# Patient Record
Sex: Female | Born: 1950 | Race: Black or African American | Hispanic: No | Marital: Married | Smoking: Never smoker
Health system: Southern US, Community
[De-identification: ages and names within clinical notes are randomized; demographics above are authoritative.]

## PROBLEM LIST (undated history)

## (undated) DIAGNOSIS — M8580 Other specified disorders of bone density and structure, unspecified site: Secondary | ICD-10-CM

## (undated) DIAGNOSIS — R112 Nausea with vomiting, unspecified: Secondary | ICD-10-CM

## (undated) DIAGNOSIS — E7800 Pure hypercholesterolemia, unspecified: Secondary | ICD-10-CM

## (undated) DIAGNOSIS — M797 Fibromyalgia: Secondary | ICD-10-CM

## (undated) DIAGNOSIS — E119 Type 2 diabetes mellitus without complications: Secondary | ICD-10-CM

## (undated) DIAGNOSIS — K851 Biliary acute pancreatitis without necrosis or infection: Secondary | ICD-10-CM

## (undated) DIAGNOSIS — I639 Cerebral infarction, unspecified: Secondary | ICD-10-CM

## (undated) DIAGNOSIS — K529 Noninfective gastroenteritis and colitis, unspecified: Secondary | ICD-10-CM

## (undated) DIAGNOSIS — I10 Essential (primary) hypertension: Secondary | ICD-10-CM

## (undated) DIAGNOSIS — M1990 Unspecified osteoarthritis, unspecified site: Secondary | ICD-10-CM

## (undated) DIAGNOSIS — Z9889 Other specified postprocedural states: Secondary | ICD-10-CM

## (undated) DIAGNOSIS — K219 Gastro-esophageal reflux disease without esophagitis: Secondary | ICD-10-CM

## (undated) DIAGNOSIS — J309 Allergic rhinitis, unspecified: Secondary | ICD-10-CM

## (undated) DIAGNOSIS — G5600 Carpal tunnel syndrome, unspecified upper limb: Secondary | ICD-10-CM

## (undated) HISTORY — PX: TUBAL LIGATION: SHX77

## (undated) HISTORY — PX: COLPOSCOPY: SHX161

## (undated) HISTORY — DX: Unspecified osteoarthritis, unspecified site: M19.90

## (undated) HISTORY — PX: PELVIC LAPAROSCOPY: SHX162

## (undated) HISTORY — PX: HAND SURGERY: SHX662

## (undated) HISTORY — DX: Pure hypercholesterolemia, unspecified: E78.00

## (undated) HISTORY — DX: Noninfective gastroenteritis and colitis, unspecified: K52.9

## (undated) HISTORY — PX: KNEE ARTHROSCOPY: SUR90

## (undated) HISTORY — DX: Cerebral infarction, unspecified: I63.9

## (undated) HISTORY — DX: Other specified disorders of bone density and structure, unspecified site: M85.80

## (undated) HISTORY — DX: Allergic rhinitis, unspecified: J30.9

## (undated) HISTORY — PX: APPENDECTOMY: SHX54

## (undated) HISTORY — PX: NECK SURGERY: SHX720

## (undated) HISTORY — PX: CATARACT EXTRACTION, BILATERAL: SHX1313

## (undated) HISTORY — DX: Biliary acute pancreatitis without necrosis or infection: K85.10

## (undated) HISTORY — DX: Fibromyalgia: M79.7

## (undated) HISTORY — DX: Carpal tunnel syndrome, unspecified upper limb: G56.00

---

## 1992-12-19 HISTORY — PX: CERVICAL BIOPSY  W/ LOOP ELECTRODE EXCISION: SUR135

## 1996-11-18 HISTORY — PX: OTHER SURGICAL HISTORY: SHX169

## 1999-09-16 ENCOUNTER — Other Ambulatory Visit: Admission: RE | Admit: 1999-09-16 | Discharge: 1999-09-16 | Payer: Self-pay | Admitting: Gynecology

## 2000-08-02 ENCOUNTER — Other Ambulatory Visit: Admission: RE | Admit: 2000-08-02 | Discharge: 2000-08-02 | Payer: Self-pay | Admitting: Gynecology

## 2000-10-31 ENCOUNTER — Encounter (INDEPENDENT_AMBULATORY_CARE_PROVIDER_SITE_OTHER): Payer: Self-pay | Admitting: Specialist

## 2000-10-31 ENCOUNTER — Encounter: Payer: Self-pay | Admitting: Surgery

## 2000-10-31 ENCOUNTER — Observation Stay (HOSPITAL_COMMUNITY): Admission: EM | Admit: 2000-10-31 | Discharge: 2000-11-01 | Payer: Self-pay | Admitting: Emergency Medicine

## 2001-09-03 ENCOUNTER — Other Ambulatory Visit: Admission: RE | Admit: 2001-09-03 | Discharge: 2001-09-03 | Payer: Self-pay | Admitting: Gynecology

## 2002-06-11 ENCOUNTER — Ambulatory Visit (HOSPITAL_COMMUNITY): Admission: RE | Admit: 2002-06-11 | Discharge: 2002-06-11 | Payer: Self-pay | Admitting: *Deleted

## 2002-09-09 ENCOUNTER — Other Ambulatory Visit: Admission: RE | Admit: 2002-09-09 | Discharge: 2002-09-09 | Payer: Self-pay | Admitting: Gynecology

## 2003-01-31 ENCOUNTER — Encounter: Admission: RE | Admit: 2003-01-31 | Discharge: 2003-01-31 | Payer: Self-pay | Admitting: General Surgery

## 2003-01-31 ENCOUNTER — Encounter: Payer: Self-pay | Admitting: General Surgery

## 2003-10-20 ENCOUNTER — Other Ambulatory Visit: Admission: RE | Admit: 2003-10-20 | Discharge: 2003-10-20 | Payer: Self-pay | Admitting: Gynecology

## 2003-11-03 ENCOUNTER — Encounter: Admission: RE | Admit: 2003-11-03 | Discharge: 2003-11-03 | Payer: Self-pay | Admitting: General Surgery

## 2004-11-09 ENCOUNTER — Other Ambulatory Visit: Admission: RE | Admit: 2004-11-09 | Discharge: 2004-11-09 | Payer: Self-pay | Admitting: Gynecology

## 2005-02-02 ENCOUNTER — Encounter: Admission: RE | Admit: 2005-02-02 | Discharge: 2005-02-02 | Payer: Self-pay | Admitting: General Surgery

## 2005-06-17 ENCOUNTER — Encounter: Admission: RE | Admit: 2005-06-17 | Discharge: 2005-06-17 | Payer: Self-pay | Admitting: General Surgery

## 2005-06-17 ENCOUNTER — Encounter (INDEPENDENT_AMBULATORY_CARE_PROVIDER_SITE_OTHER): Payer: Self-pay | Admitting: Specialist

## 2005-06-17 ENCOUNTER — Other Ambulatory Visit: Admission: RE | Admit: 2005-06-17 | Discharge: 2005-06-17 | Payer: Self-pay | Admitting: Diagnostic Radiology

## 2005-07-04 ENCOUNTER — Encounter: Admission: RE | Admit: 2005-07-04 | Discharge: 2005-07-04 | Payer: Self-pay | Admitting: General Surgery

## 2005-07-22 ENCOUNTER — Encounter: Admission: RE | Admit: 2005-07-22 | Discharge: 2005-07-22 | Payer: Self-pay | Admitting: General Surgery

## 2005-12-15 ENCOUNTER — Other Ambulatory Visit: Admission: RE | Admit: 2005-12-15 | Discharge: 2005-12-15 | Payer: Self-pay | Admitting: Gynecology

## 2006-06-03 ENCOUNTER — Emergency Department (HOSPITAL_COMMUNITY): Admission: EM | Admit: 2006-06-03 | Discharge: 2006-06-03 | Payer: Self-pay | Admitting: Emergency Medicine

## 2006-12-18 ENCOUNTER — Other Ambulatory Visit: Admission: RE | Admit: 2006-12-18 | Discharge: 2006-12-18 | Payer: Self-pay | Admitting: Gynecology

## 2007-12-10 ENCOUNTER — Emergency Department (HOSPITAL_COMMUNITY): Admission: EM | Admit: 2007-12-10 | Discharge: 2007-12-10 | Payer: Self-pay | Admitting: Emergency Medicine

## 2007-12-21 ENCOUNTER — Other Ambulatory Visit: Admission: RE | Admit: 2007-12-21 | Discharge: 2007-12-21 | Payer: Self-pay | Admitting: Gynecology

## 2008-04-18 ENCOUNTER — Emergency Department (HOSPITAL_COMMUNITY): Admission: EM | Admit: 2008-04-18 | Discharge: 2008-04-18 | Payer: Self-pay | Admitting: Family Medicine

## 2008-11-06 ENCOUNTER — Ambulatory Visit (HOSPITAL_COMMUNITY): Admission: RE | Admit: 2008-11-06 | Discharge: 2008-11-07 | Payer: Self-pay | Admitting: Orthopedic Surgery

## 2008-12-29 ENCOUNTER — Encounter: Payer: Self-pay | Admitting: Gynecology

## 2008-12-29 ENCOUNTER — Ambulatory Visit: Payer: Self-pay | Admitting: Gynecology

## 2008-12-29 ENCOUNTER — Other Ambulatory Visit: Admission: RE | Admit: 2008-12-29 | Discharge: 2008-12-29 | Payer: Self-pay | Admitting: Gynecology

## 2009-09-07 ENCOUNTER — Encounter: Admission: RE | Admit: 2009-09-07 | Discharge: 2009-09-07 | Payer: Self-pay | Admitting: Orthopedic Surgery

## 2009-11-02 ENCOUNTER — Encounter: Admission: RE | Admit: 2009-11-02 | Discharge: 2009-11-02 | Payer: Self-pay | Admitting: Orthopedic Surgery

## 2009-11-11 ENCOUNTER — Telehealth (INDEPENDENT_AMBULATORY_CARE_PROVIDER_SITE_OTHER): Payer: Self-pay | Admitting: *Deleted

## 2010-01-04 ENCOUNTER — Ambulatory Visit: Payer: Self-pay | Admitting: Gynecology

## 2010-01-04 ENCOUNTER — Other Ambulatory Visit: Admission: RE | Admit: 2010-01-04 | Discharge: 2010-01-04 | Payer: Self-pay | Admitting: Gynecology

## 2010-03-18 ENCOUNTER — Ambulatory Visit: Payer: Self-pay | Admitting: Gynecology

## 2010-08-30 ENCOUNTER — Encounter: Admission: RE | Admit: 2010-08-30 | Discharge: 2010-08-30 | Payer: Self-pay | Admitting: Cardiology

## 2011-01-09 ENCOUNTER — Encounter: Payer: Self-pay | Admitting: Gynecology

## 2011-01-09 ENCOUNTER — Encounter: Payer: Self-pay | Admitting: Family Medicine

## 2011-02-18 ENCOUNTER — Encounter: Payer: Self-pay | Admitting: Gynecology

## 2011-03-21 ENCOUNTER — Encounter (INDEPENDENT_AMBULATORY_CARE_PROVIDER_SITE_OTHER): Payer: BC Managed Care – PPO | Admitting: Gynecology

## 2011-03-21 ENCOUNTER — Other Ambulatory Visit: Payer: Self-pay | Admitting: Gynecology

## 2011-03-21 ENCOUNTER — Other Ambulatory Visit (HOSPITAL_COMMUNITY)
Admission: RE | Admit: 2011-03-21 | Discharge: 2011-03-21 | Disposition: A | Discharge status: Home / Self Care | Payer: BC Managed Care – PPO | Source: Ambulatory Visit | Attending: Gynecology | Admitting: Gynecology

## 2011-03-21 DIAGNOSIS — Z01419 Encounter for gynecological examination (general) (routine) without abnormal findings: Secondary | ICD-10-CM

## 2011-03-21 DIAGNOSIS — Z124 Encounter for screening for malignant neoplasm of cervix: Secondary | ICD-10-CM | POA: Insufficient documentation

## 2011-03-21 DIAGNOSIS — Z13220 Encounter for screening for lipoid disorders: Secondary | ICD-10-CM

## 2011-03-21 DIAGNOSIS — R82998 Other abnormal findings in urine: Secondary | ICD-10-CM

## 2011-03-21 DIAGNOSIS — Z833 Family history of diabetes mellitus: Secondary | ICD-10-CM

## 2011-03-24 ENCOUNTER — Other Ambulatory Visit (INDEPENDENT_AMBULATORY_CARE_PROVIDER_SITE_OTHER): Payer: BC Managed Care – PPO

## 2011-03-24 DIAGNOSIS — E7800 Pure hypercholesterolemia, unspecified: Secondary | ICD-10-CM

## 2011-04-25 ENCOUNTER — Other Ambulatory Visit: Payer: BC Managed Care – PPO

## 2011-05-03 NOTE — Op Note (Signed)
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   OPERATIVE PROCEDURE:  Anterior cervical diskectomy and fusion at C5-C6.   COMPLICATIONS:  None.   CONDITION:  Stable.   HISTORY:  This is a very pleasant 60 year old woman who has been having  longstanding severe debilitating neck and right arm pain.  The patient's  clinical history was consistent with a C6 radiculopathy due to the C5-C6  right-sided hard disk osteophyte.  After discussing treatment options  and having failed conservative management, she elected to proceed with  surgery.   All appropriate risks, benefits, and alternatives were discussed and  consent was obtained.   OPERATIVE NOTE:  The patient was brought to the operating room and  placed supine on the operating table.  After successful induction of  general anesthesia and endotracheal intubation, TEDs and SCDs were  applied.  She was placed supine on the Skytron bed.  The roll towels  were placed between the shoulder blades.  The shoulders were taped and  the anterior cervical spine was prepped and draped in the usual standard  fashion.   The inferior aspect of the thyroid cartilage was palpated and a  horizontal incision was made on the left-hand side.  Sharp dissection  was carried out down to and through the platysma.  I then sharply  dissected through  the deep cervical fascia keeping the carotid sheath  lateral and the trachea and esophagus medial.  I then swept with a  finger dissecting bluntly through the deep cervical and prevertebral  fascia to palpate the anterior cervical spine.  I then swept the trachea  and esophagus medially with my finger, palpated and protected the  carotid sheath laterally with another finger.  I then placed an  appendiceal retractor to retract the trachea and esophagus and exposed  the anterior cervical spine.   An 18-gauge needle was then placed through the C5 disk space.  Intraoperative lateral fluoroscopy confirmed that I was at the  appropriate level.  Once this was confirmed, I then mobilized the longus  coli muscles bilaterally out to the level of the uncovertebral joints.  At this point, I had clear visualization of C5-C6 disk space.  I placed  self-retaining retractors into the wounds (shadow line retractors),  deflated the endotracheal cuff and expanded into the appropriate  position.  Care was taken to ensure that the blades were beneath the  longus coli muscles.   Once the endotracheal cuff was inflated to remove any pressure points.  I placed the distraction pins into the bodies of C5-C6 and gently  distracted the C5-C6 disk space.   A 15 blade scalpel was used to incise the annulus and then using a  combination of pituitary rongeurs, curettes, and Kerrison rongeurs, I  resected the disk.  I then removed the cartilage from the subchondral  plate to expose the bleeding subchondral bone.  I then debrided it down  removing the posterior osteophytic ridge.  Using a fine curette, I  developed a plane between the posterior longitudinal ligament and the  dura.  Using a 1-mm Kerrison, I resected the entire posterior  longitudinal ligament on the right side.  I also resected some of the  bone spurs and I removed the soft disk fragments from behind the  posterior longitudinal ligament.  At this  point, I had an adequate  decompression of the disk space and right foramen.  I then rasped the  endplates, measured the interbody space, and then took an 8-mm precut  lordotic spacer.  I hydrated it and then tacked the center with  Actifuse.  I then malleted it to the appropriate resting position.  I  then took a small 14-mm cervical plate Synthes Vector and contoured it  and then secured it to the bodies of C5 and C6 with 40-mm screws.  Because of the trapezial shape of C5, I tried to move the graft as close  as I could to the C5 inferior endplate, but it was difficult.  I did  remove the plate and checked to see if I could reposition it and I felt  as though the initial position was the best position I had and then I  used a 40-mm rescue screws to lock it back into place.  This plate was  secured.  I took final AP and lateral x-rays which were satisfactory.  I  removed the remaining retractors, checked to ensure that the esophagus  was not entrapped underneath the plate and returned to midline.  I  irrigated it copiously with normal saline, closed platysma with  interrupted 2-0 Vicryl sutures and 3-0 Monocryl for the skin.  Steri-  Strips and dry dressing were applied.  The patient was extubated and  transferred to PACU without incident.  At the end of the case, all  needle and sponge counts were correct.  Dr. Yolanda Bonine dictation cc, my  office      Alvy Beal, MD  Electronically Signed     DDB/MEDQ  D:  11/06/2008  T:  11/06/2008  Job:  [id number]

## 2011-05-06 NOTE — Op Note (Signed)
[facility]  Patient:    Rhonda Norris, Rhonda Norris                     MRN: [id number] Proc. Date: 10/31/00 Attending:  Thornton Park. Daphine Deutscher, M.D. CC:         Gabriel Earing, M.D.   Operative Report  PREOPERATIVE DIAGNOSIS:  Acute appendicitis.  POSTOPERATIVE DIAGNOSIS:  Acute appendicitis.  PROCEDURE:  Laparoscopic appendectomy.  SURGEON:  Thornton Park. Daphine Deutscher, M.D.  ANESTHESIA:  General endotracheal.  DESCRIPTION OF PROCEDURE:  Rhonda Norris was seen in the emergency room and evaluated by me.  A CT scan was obtained and this showed acute appendicitis. I reviewed this and then talked to her about a laparoscopic or open appendectomy.  She was given general anesthesia and a gram of Cefotan preoperatively.  The abdomen was prepped with Betadine and draped sterile.  I made a longitudinal incision and went into her umbilicus.  Through that small hole, I put a pursestring suture through good fascia and inserted a Hasson cannula.  The abdomen was insufflated.  I could see the turgid, erect appendix standing up in the right lower quadrant.  It was enlarged and it was consistent with the appendicitis seen on the CT scan containing an append fecolith.  A 5 mm was placed in the right upper quadrant and a 12 mm was placed in the left lower quadrant, placed very obliquely.  There was no bleeding seen from either of the trocar sites after insertion.  The appendix was then mobilized more and I went through the mesentery first with the endo GIA vascular cartilage and then across the base.  This was then placed in a bag and brought out through the umbilicus.  A couple of clips were placed on the staple line where there was a little bit of oozing and the area was irrigated and no active bleeding was seen.  The patient has previously had a hysterectomy and an oophorectomy.  I did look in the pelvis and withdrew the irrigant.  The bowel was otherwise unremarkable.  The  liver look unremarkable and the gallbladder looked normal.  The port sites were examined.  The umbilical port was examined as I tied down the pursestring suture.  The abdomen was then deflated and the trocars were withdrawn.  The wounds were closed with 4-0 Vicryl and were injected with 0.5% Marcaine.  Benzoin and Steri-Strips were applied to the skin.  The patient tolerated the procedure well and was taken to the recovery room in satisfactory condition. DD:  10/31/00 TD:  10/31/00 Job: [id number]

## 2011-07-29 ENCOUNTER — Encounter (INDEPENDENT_AMBULATORY_CARE_PROVIDER_SITE_OTHER): Payer: Self-pay | Admitting: Surgery

## 2011-08-01 ENCOUNTER — Ambulatory Visit (INDEPENDENT_AMBULATORY_CARE_PROVIDER_SITE_OTHER): Payer: BC Managed Care – PPO | Admitting: Surgery

## 2011-08-01 ENCOUNTER — Encounter (INDEPENDENT_AMBULATORY_CARE_PROVIDER_SITE_OTHER): Payer: Self-pay | Admitting: Surgery

## 2011-08-01 ENCOUNTER — Encounter (INDEPENDENT_AMBULATORY_CARE_PROVIDER_SITE_OTHER): Payer: BC Managed Care – PPO | Admitting: Surgery

## 2011-08-01 ENCOUNTER — Other Ambulatory Visit (INDEPENDENT_AMBULATORY_CARE_PROVIDER_SITE_OTHER): Payer: Self-pay | Admitting: Surgery

## 2011-08-01 VITALS — BP 140/82 | HR 88 | Temp 97.0°F | Ht 64.0 in | Wt 163.2 lb

## 2011-08-01 DIAGNOSIS — N63 Unspecified lump in unspecified breast: Secondary | ICD-10-CM

## 2011-08-01 NOTE — Progress Notes (Signed)
Rhonda Norris is a 60 y.o. female.    Chief Complaint  Patient presents with  . Other    est pt- new prob swelling on lft br    HPI HPI This is a former patient of Dr. Francina Ames that he treated for some left breast swelling and a left breast cyst. She recently underwent a routine screening mammogram in March that was negative. However over the last couple of weeks she has developed some left breast swelling. She denies any palpable masses, breast tenderness, or skin changes. Given her history with Dr. Maple Hudson, she is mildly concerned and comes in today for evaluation.  Past Medical History  Diagnosis Date  . IBD (inflammatory bowel disease)   . Breast swelling     left  . Arthritis   . Carpal tunnel syndrome     Past Surgical History  Procedure Date  . Abdominal hysterectomy   . Appendectomy   . Hand surgery     bilateral  . Knee arthroscopy     rt  . Neck surgery     Family History  Problem Relation Age of Onset  . Heart disease Mother   . Heart disease Brother     Social History History  Substance Use Topics  . Smoking status: Never Smoker   . Smokeless tobacco: Not on file  . Alcohol Use: No    Allergies  Allergen Reactions  . Sulfur     Current Outpatient Prescriptions  Medication Sig Dispense Refill  . Cholecalciferol (VITAMIN D PO) Take by mouth daily.        . Multiple Vitamin (MULTIVITAMIN) capsule Take 1 capsule by mouth daily.        . nortriptyline (PAMELOR) 10 MG capsule Take 10 mg by mouth at bedtime.          Review of Systems ROS Menarche - age 3 First pregnancy - age 40 Breastfeeding - no Menopause - age 67 with hysterectomy Hormones - for several years Physical Exam Physical Exam   Blood pressure 140/82, pulse 88, temperature 97 F (36.1 C), temperature source Temporal, height 5\' 4"  (1.626 m), weight 163 lb 3.2 oz (74.027 kg). WDWN in NAD Breasts - slightly enlarged left breast; no palpable masses in either breast; no  axillary lymphadenopathy; slight fullness in left inner lower quadrant  Assessment/Plan Left breast asymmetry with slight breast swelling - history of the same problem six years ago  Plan:  Left breast ultrasound to evaluate for possible enlarged cyst - possible aspiration.  Burk Hoctor K. 08/01/2011, 11:57 AM

## 2011-08-03 ENCOUNTER — Encounter (INDEPENDENT_AMBULATORY_CARE_PROVIDER_SITE_OTHER): Payer: Self-pay | Admitting: Gynecology

## 2011-08-04 ENCOUNTER — Ambulatory Visit
Admission: RE | Admit: 2011-08-04 | Discharge: 2011-08-04 | Disposition: A | Discharge status: Home / Self Care | Payer: BC Managed Care – PPO | Source: Ambulatory Visit | Attending: Surgery | Admitting: Surgery

## 2011-08-04 DIAGNOSIS — N63 Unspecified lump in unspecified breast: Secondary | ICD-10-CM

## 2011-08-25 ENCOUNTER — Encounter (INDEPENDENT_AMBULATORY_CARE_PROVIDER_SITE_OTHER): Payer: BC Managed Care – PPO | Admitting: Surgery

## 2011-09-20 LAB — BASIC METABOLIC PANEL
BUN: 12
Creatinine, Ser: 0.69
GFR calc Af Amer: >60
Glucose, Bld: 106 — ABNORMAL HIGH

## 2011-09-20 LAB — CBC
HCT: 37.2
MCHC: 33.3
MCV: 89.2
Platelets: 383
RBC: 4.18

## 2012-07-30 ENCOUNTER — Other Ambulatory Visit: Payer: Self-pay | Admitting: Orthopedic Surgery

## 2012-07-30 DIAGNOSIS — S8991XA Unspecified injury of right lower leg, initial encounter: Secondary | ICD-10-CM

## 2012-07-30 DIAGNOSIS — M1990 Unspecified osteoarthritis, unspecified site: Secondary | ICD-10-CM

## 2012-08-06 ENCOUNTER — Ambulatory Visit
Admission: RE | Admit: 2012-08-06 | Discharge: 2012-08-06 | Disposition: A | Discharge status: Home / Self Care | Payer: Worker's Compensation | Source: Ambulatory Visit | Attending: Orthopedic Surgery | Admitting: Orthopedic Surgery

## 2012-08-06 DIAGNOSIS — S8991XA Unspecified injury of right lower leg, initial encounter: Secondary | ICD-10-CM

## 2012-08-06 DIAGNOSIS — M1990 Unspecified osteoarthritis, unspecified site: Secondary | ICD-10-CM

## 2012-08-06 MED ORDER — IOHEXOL 180 MG/ML  SOLN
20.0000 mL | Freq: Once | INTRAMUSCULAR | Status: AC | PRN
  Administered 2012-08-06: 20 mL via INTRA_ARTICULAR

## 2012-09-04 ENCOUNTER — Other Ambulatory Visit: Payer: Self-pay | Admitting: Gynecology

## 2012-09-04 DIAGNOSIS — Z1231 Encounter for screening mammogram for malignant neoplasm of breast: Secondary | ICD-10-CM

## 2012-09-12 ENCOUNTER — Encounter: Payer: Self-pay | Admitting: Gynecology

## 2012-09-12 ENCOUNTER — Ambulatory Visit (INDEPENDENT_AMBULATORY_CARE_PROVIDER_SITE_OTHER): Payer: Self-pay | Admitting: Gynecology

## 2012-09-12 ENCOUNTER — Ambulatory Visit
Admission: RE | Admit: 2012-09-12 | Discharge: 2012-09-12 | Disposition: A | Discharge status: Home / Self Care | Payer: BC Managed Care – PPO | Source: Ambulatory Visit | Attending: Gynecology | Admitting: Gynecology

## 2012-09-12 VITALS — BP 124/72 | Ht 64.0 in | Wt 167.0 lb

## 2012-09-12 DIAGNOSIS — M949 Disorder of cartilage, unspecified: Secondary | ICD-10-CM

## 2012-09-12 DIAGNOSIS — M899 Disorder of bone, unspecified: Secondary | ICD-10-CM

## 2012-09-12 DIAGNOSIS — Z1231 Encounter for screening mammogram for malignant neoplasm of breast: Secondary | ICD-10-CM

## 2012-09-12 DIAGNOSIS — M8580 Other specified disorders of bone density and structure, unspecified site: Secondary | ICD-10-CM

## 2012-09-12 DIAGNOSIS — Z01419 Encounter for gynecological examination (general) (routine) without abnormal findings: Secondary | ICD-10-CM

## 2012-09-12 NOTE — Progress Notes (Signed)
Rhonda Norris 1951-09-12 [id number]        61 y.o.  G3P3 for annual exam.  Several issues noted below.  Past medical history,surgical history, medications, allergies, family history and social history were all reviewed and documented in the EPIC chart. ROS:  Was performed and pertinent positives and negatives are included in the history.  Exam: Rhonda Norris assistant Filed Vitals:   09/12/12 0945  BP: 124/72  Height: 5\' 4"  (1.626 m)  Weight: 167 lb (75.751 kg)   General appearance  Normal Skin grossly normal Head/Neck normal with no cervical or supraclavicular adenopathy thyroid normal Lungs  clear Cardiac RR, without RMG Abdominal  soft, nontender, without masses, organomegaly or hernia Breasts  examined lying and sitting without masses, retractions, discharge or axillary adenopathy. Pelvic  Ext/BUS/vagina  normal   Adnexa  Without masses or tenderness    Anus and perineum  normal   Rectovaginal  normal sphincter tone without palpated masses or tenderness.    Assessment/Plan:  61 y.o. G3P3 female for annual exam.   1. Status post TVH BSO/postmenopausal. Vision doing well without significant symptoms and we'll continue to monitor. 2. Pap smear. No Pap done today. Last Pap smear 2012. Patient has numerous normal records in her chart. Does have history of LEEP/TVH a number of years ago and those records are off site.  Will review severity of dysplasia before deciding about stop doing Pap smears versus less frequent screening. 3. Mammography. Patient had her mammogram today and will continue with annual mammography. SBE monthly reviewed. 4. Colonoscopy. Patient due for colonoscopy this year and plans to arrange it. 5. Osteopenia. Patient had history of osteopenia previously and was transiently on Fosamax a number of years ago. Her most recent scan in 12/2007 was normal. Recommend repeat DEXA now. Increase calcium to be reviewed. 6. Health maintenance. No blood work done today. She does  have a history of hypercholesterolemia in the past. Recommended she continue to follow up with her primary physician and have blood work done through their office so that they can monitor and treat as needed. Patient agrees with this and will make an appointment to see them. Follow up for DEXA otherwise annually    Rhonda Lords MD, 10:29 AM 09/12/2012

## 2012-09-12 NOTE — Patient Instructions (Signed)
Schedule DEXA bone study. Follow up in one year for annual exam

## 2012-09-13 LAB — URINALYSIS W MICROSCOPIC + REFLEX CULTURE
Bacteria, UA: NONE SEEN
Bilirubin Urine: NEGATIVE
Casts: NONE SEEN
Crystals: NONE SEEN
Ketones, ur: NEGATIVE mg/dL
Nitrite: NEGATIVE
Specific Gravity, Urine: 1.018 (ref 1.005–1.030)
Urobilinogen, UA: 0.2 mg/dL (ref 0.0–1.0)
pH: 5 (ref 5.0–8.0)

## 2012-09-17 ENCOUNTER — Encounter: Payer: Self-pay | Admitting: Gynecology

## 2012-09-18 ENCOUNTER — Other Ambulatory Visit: Payer: Self-pay | Admitting: Gynecology

## 2012-09-18 DIAGNOSIS — R928 Other abnormal and inconclusive findings on diagnostic imaging of breast: Secondary | ICD-10-CM

## 2012-09-18 DIAGNOSIS — R921 Mammographic calcification found on diagnostic imaging of breast: Secondary | ICD-10-CM

## 2012-09-20 ENCOUNTER — Ambulatory Visit
Admission: RE | Admit: 2012-09-20 | Discharge: 2012-09-20 | Disposition: A | Discharge status: Home / Self Care | Payer: BC Managed Care – PPO | Source: Ambulatory Visit | Attending: Gynecology | Admitting: Gynecology

## 2012-09-20 DIAGNOSIS — R928 Other abnormal and inconclusive findings on diagnostic imaging of breast: Secondary | ICD-10-CM

## 2012-09-20 DIAGNOSIS — R921 Mammographic calcification found on diagnostic imaging of breast: Secondary | ICD-10-CM

## 2012-09-26 ENCOUNTER — Encounter: Payer: Self-pay | Admitting: Gynecology

## 2012-10-03 ENCOUNTER — Encounter: Payer: Self-pay | Admitting: Gynecology

## 2012-10-23 ENCOUNTER — Ambulatory Visit (INDEPENDENT_AMBULATORY_CARE_PROVIDER_SITE_OTHER): Payer: BC Managed Care – PPO

## 2012-10-23 DIAGNOSIS — M8580 Other specified disorders of bone density and structure, unspecified site: Secondary | ICD-10-CM

## 2012-10-23 DIAGNOSIS — M899 Disorder of bone, unspecified: Secondary | ICD-10-CM

## 2012-10-26 ENCOUNTER — Encounter: Payer: Self-pay | Admitting: Gynecology

## 2012-10-26 ENCOUNTER — Telehealth: Payer: Self-pay | Admitting: Gynecology

## 2012-10-26 NOTE — Telephone Encounter (Signed)
Tell patient that her bone density study showed some loss of bone but overall appears stable from prior study. Recommend no intervention at this time other than calcium 500 mg daily, vitamin D 1000 units daily and weight-bearing exercise like daily walking. Recommend rechecking bone density in 2 years.

## 2012-10-26 NOTE — Telephone Encounter (Signed)
Pt informed with the below. 

## 2013-09-16 ENCOUNTER — Ambulatory Visit (INDEPENDENT_AMBULATORY_CARE_PROVIDER_SITE_OTHER): Payer: BC Managed Care – PPO | Admitting: Gynecology

## 2013-09-16 ENCOUNTER — Encounter: Payer: Self-pay | Admitting: Gynecology

## 2013-09-16 VITALS — BP 124/76 | Ht 64.0 in | Wt 164.0 lb

## 2013-09-16 DIAGNOSIS — N951 Menopausal and female climacteric states: Secondary | ICD-10-CM

## 2013-09-16 DIAGNOSIS — M899 Disorder of bone, unspecified: Secondary | ICD-10-CM

## 2013-09-16 DIAGNOSIS — Z01419 Encounter for gynecological examination (general) (routine) without abnormal findings: Secondary | ICD-10-CM

## 2013-09-16 DIAGNOSIS — M8580 Other specified disorders of bone density and structure, unspecified site: Secondary | ICD-10-CM

## 2013-09-16 NOTE — Patient Instructions (Signed)
Schedule colonoscopy with [facility] at [phone] or [facility] at [phone]  

## 2013-09-16 NOTE — Progress Notes (Signed)
Rhonda Norris 07-17-1951 [id number]        62 y.o.  G3P3 for annual exam.  Several issues noted below.  Past medical history,surgical history, medications, allergies, family history and social history were all reviewed and documented in the EPIC chart.  ROS:  Performed and pertinent positives and negatives are included in the history, assessment and plan .  Exam: Kim assistant Filed Vitals:   09/16/13 1034  BP: 124/76  Height: 5\' 4"  (1.626 m)  Weight: 164 lb (74.39 kg)   General appearance  Normal Skin grossly normal Head/Neck normal with no cervical or supraclavicular adenopathy thyroid normal Lungs  clear Cardiac RR, without RMG Abdominal  soft, nontender, without masses, organomegaly or hernia Breasts  examined lying and sitting without masses, retractions, discharge or axillary adenopathy. Pelvic  Ext/BUS/vagina  normal  Adnexa  Without masses or tenderness    Anus and perineum  normal   Rectovaginal  normal sphincter tone without palpated masses or tenderness.    Assessment/Plan:  [name] for annual exam.   1. Status post TVH BSO 1997. Still having hot flashes. Had been on HRT previously up to Estrace 2 mg daily. Has stopped this due to breast tenderness and her concern about breast cancer. I rediscussed HRT with her and she is not interested in reinitiating. She is using OTC soy-based products and plans to continue to do so. 2. Pap smear 2012. No Pap smear done today. History of LEEP for CIN 2 in 1994. Her subsequent Pap smears have been normal. Discussed options to stop screening altogether or less frequent screening interval and we'll readdress on an annual basis. 3. Osteopenia. DEXA 10/2012 with T score -2.1. FRAX 3.1%/0.2%. Does have a history of osteopenia in the past with transient Fosamax x2 years 2007-2009. One of her Dexa did use black ethnicity instead of Caucasian base which made her numbers appear worse that year. Regardless her last DEXA was  appropriately compared. We'll check vitamin D level today. Increase calcium vitamin D reviewed. Repeat DEXA next year at a 2 year interval. 4. Mammography due now patient is to schedule it and agrees to do so. SBE monthly reviewed. 5. Colonoscopy overdue and she agrees to schedule this. 6. Health maintenance. No other blood work done as she reports it done through her primary physician's office. Followup one year, sooner as needed.  Note: This document was prepared with digital dictation and possible smart phrase technology. Any transcriptional errors that result from this process are unintentional.   Dara Lords MD, 11:03 AM 09/16/2013

## 2013-09-17 LAB — VITAMIN D 25 HYDROXY (VIT D DEFICIENCY, FRACTURES): Vit D, 25-Hydroxy: 54 ng/mL (ref 30–89)

## 2013-09-17 LAB — URINALYSIS W MICROSCOPIC + REFLEX CULTURE
Bacteria, UA: NONE SEEN
Bilirubin Urine: NEGATIVE
Casts: NONE SEEN
Crystals: NONE SEEN
Hgb urine dipstick: NEGATIVE
Ketones, ur: NEGATIVE mg/dL
Nitrite: NEGATIVE
Specific Gravity, Urine: 1.013 (ref 1.005–1.030)
pH: 5.5 (ref 5.0–8.0)

## 2013-09-23 ENCOUNTER — Other Ambulatory Visit: Payer: Self-pay | Admitting: Gynecology

## 2013-09-23 DIAGNOSIS — R921 Mammographic calcification found on diagnostic imaging of breast: Secondary | ICD-10-CM

## 2013-09-26 ENCOUNTER — Ambulatory Visit
Admission: RE | Admit: 2013-09-26 | Discharge: 2013-09-26 | Disposition: A | Discharge status: Home / Self Care | Payer: BC Managed Care – PPO | Source: Ambulatory Visit | Attending: Gynecology | Admitting: Gynecology

## 2013-09-26 DIAGNOSIS — R921 Mammographic calcification found on diagnostic imaging of breast: Secondary | ICD-10-CM

## 2013-10-23 ENCOUNTER — Other Ambulatory Visit: Payer: Self-pay | Admitting: Gastroenterology

## 2013-11-21 ENCOUNTER — Encounter: Payer: Self-pay | Admitting: Gynecology

## 2014-09-23 ENCOUNTER — Other Ambulatory Visit: Payer: Self-pay | Admitting: Gynecology

## 2014-09-23 DIAGNOSIS — R921 Mammographic calcification found on diagnostic imaging of breast: Secondary | ICD-10-CM

## 2014-09-26 ENCOUNTER — Other Ambulatory Visit: Payer: Self-pay | Admitting: Gynecology

## 2014-09-26 ENCOUNTER — Other Ambulatory Visit: Payer: Self-pay

## 2014-09-26 DIAGNOSIS — R921 Mammographic calcification found on diagnostic imaging of breast: Secondary | ICD-10-CM

## 2014-10-02 ENCOUNTER — Encounter (INDEPENDENT_AMBULATORY_CARE_PROVIDER_SITE_OTHER): Payer: Self-pay

## 2014-10-02 ENCOUNTER — Ambulatory Visit
Admission: RE | Admit: 2014-10-02 | Discharge: 2014-10-02 | Disposition: A | Discharge status: Home / Self Care | Payer: BC Managed Care – PPO | Source: Ambulatory Visit | Attending: Gynecology | Admitting: Gynecology

## 2014-10-02 DIAGNOSIS — R921 Mammographic calcification found on diagnostic imaging of breast: Secondary | ICD-10-CM

## 2014-10-20 ENCOUNTER — Encounter: Payer: Self-pay | Admitting: Gynecology

## 2014-11-12 ENCOUNTER — Encounter: Payer: BC Managed Care – PPO | Admitting: Gynecology

## 2014-12-17 ENCOUNTER — Ambulatory Visit (INDEPENDENT_AMBULATORY_CARE_PROVIDER_SITE_OTHER): Payer: BC Managed Care – PPO | Admitting: Gynecology

## 2014-12-17 ENCOUNTER — Encounter: Payer: Self-pay | Admitting: Gynecology

## 2014-12-17 ENCOUNTER — Other Ambulatory Visit (HOSPITAL_COMMUNITY)
Admission: RE | Admit: 2014-12-17 | Discharge: 2014-12-17 | Disposition: A | Discharge status: Home / Self Care | Payer: BC Managed Care – PPO | Source: Ambulatory Visit | Attending: Gynecology | Admitting: Gynecology

## 2014-12-17 VITALS — BP 160/80 | Ht 64.0 in | Wt 164.0 lb

## 2014-12-17 DIAGNOSIS — Z01419 Encounter for gynecological examination (general) (routine) without abnormal findings: Secondary | ICD-10-CM

## 2014-12-17 DIAGNOSIS — M8580 Other specified disorders of bone density and structure, unspecified site: Secondary | ICD-10-CM

## 2014-12-17 DIAGNOSIS — N951 Menopausal and female climacteric states: Secondary | ICD-10-CM

## 2014-12-17 NOTE — Progress Notes (Signed)
Rhonda Norris August 27, 1951 [id number]        63 y.o.  G3P3 for annual exam.  Several issues noted below.  Past medical history,surgical history, problem list, medications, allergies, family history and social history were all reviewed and documented as reviewed in the EPIC chart.  ROS:  Performed with pertinent positives and negatives included in the history, assessment and plan.   Additional significant findings :  none   Exam: Delena ServeKim Alexis assistant Filed Vitals:   12/17/14 0945  BP: 160/80  Height: 5\' 4"  (1.626 m)  Weight: 164 lb (74.39 kg)   General appearance:  Normal affect, orientation and appearance. Skin: Grossly normal HEENT: Without gross lesions.  No cervical or supraclavicular adenopathy. Thyroid normal.  Lungs:  Clear without wheezing, rales or rhonchi Cardiac: RR, without RMG Abdominal:  Soft, nontender, without masses, guarding, rebound, organomegaly or hernia Breasts:  Examined lying and sitting without masses, retractions, discharge or axillary adenopathy. Pelvic:  Ext/BUS/vagina with atrophic changes. Pap of cuff done.  Adnexa  Without masses or tenderness    Anus and perineum  Normal   Rectovaginal  Normal sphincter tone without palpated masses or tenderness.    Assessment/Plan:  63 y.o. G3P3 female for annual exam.   1. Postmenopausal/atrophic genital changes. Status post TVH BSO 1997 for leiomyoma and endometriosis. Had been on ERT but discontinued. Still having some hot flashes and sweats but overall tolerable. She is not interested in reinitiation of ERT. Continue to monitor. Follow up if any issues. 2. Osteopenia. DEXA 10/2012 T score -2.1. FRAX 3.1%/0.2%. Transient use of Fosamax for 2 years between 2007 through 2009. Repeat DEXA now at 2 year interval. Increased calcium and vitamin D reviewed. 3. Pap smear 2012. Pap smear of vaginal cuff today. History of CIN-2 status post LEEP 1994. Normal Pap smears since then. Options to stop screening altogether as she  is status post hysterectomy and over 20 years out from her abnormal Pap smears reviewed . Will readdress on an annual basis. 4. Mammography 09/2014. Continue with annual mammography. SBE monthly reviewed. 5. Colonoscopy 2014. Repeat at their recommended interval. 6. Health maintenance. No routine blood work done as this is done at her primary physician's office. She has a has an appointment coming up. Blood pressure today is 160/80 that she attributes to a difficult drive to her appointment today due to the weather.  Recommend repeating her blood pressure in a non-exam situation. If it remains elevated then to follow up ASAP with her primary physician. Otherwise will follow up with her scheduled appointment to have it rechecked. Follow up here for DEXA otherwise in one year assuming she continues well.     Dara LordsFONTAINE,Daundre Biel P MD, 10:03 AM 12/17/2014

## 2014-12-17 NOTE — Addendum Note (Signed)
Addended by: Kem ParkinsonBARNES, Selassie Spatafore on: 12/17/2014 10:40 AM   Modules accepted: Orders, SmartSet

## 2014-12-17 NOTE — Patient Instructions (Signed)
Follow up for the bone density as scheduled.  You may obtain a copy of any labs that were done today by logging onto MyChart as outlined in the instructions provided with your AVS (after visit summary). The office will not call with normal lab results but certainly if there are any significant abnormalities then we will contact you.   Health Maintenance, Female A healthy lifestyle and preventative care can promote health and wellness.  Maintain regular health, dental, and eye exams.  Eat a healthy diet. Foods like vegetables, fruits, whole grains, low-fat dairy products, and lean protein foods contain the nutrients you need without too many calories. Decrease your intake of foods high in solid fats, added sugars, and salt. Get information about a proper diet from your caregiver, if necessary.  Regular physical exercise is one of the most important things you can do for your health. Most adults should get at least 150 minutes of moderate-intensity exercise (any activity that increases your heart rate and causes you to sweat) each week. In addition, most adults need muscle-strengthening exercises on 2 or more days a week.   Maintain a healthy weight. The body mass index (BMI) is a screening tool to identify possible weight problems. It provides an estimate of body fat based on height and weight. Your caregiver can help determine your BMI, and can help you achieve or maintain a healthy weight. For adults 20 years and older:  A BMI below 18.5 is considered underweight.  A BMI of 18.5 to 24.9 is normal.  A BMI of 25 to 29.9 is considered overweight.  A BMI of 30 and above is considered obese.  Maintain normal blood lipids and cholesterol by exercising and minimizing your intake of saturated fat. Eat a balanced diet with plenty of fruits and vegetables. Blood tests for lipids and cholesterol should begin at age 63 and be repeated every 5 years. If your lipid or cholesterol levels are high, you are  over 50, or you are a high risk for heart disease, you may need your cholesterol levels checked more frequently.Ongoing high lipid and cholesterol levels should be treated with medicines if diet and exercise are not effective.  If you smoke, find out from your caregiver how to quit. If you do not use tobacco, do not start.  Lung cancer screening is recommended for adults aged 76 80 years who are at high risk for developing lung cancer because of a history of smoking. Yearly low-dose computed tomography (CT) is recommended for people who have at least a 30-pack-year history of smoking and are a current smoker or have quit within the past 15 years. A pack year of smoking is smoking an average of 1 pack of cigarettes a day for 1 year (for example: 1 pack a day for 30 years or 2 packs a day for 15 years). Yearly screening should continue until the smoker has stopped smoking for at least 15 years. Yearly screening should also be stopped for people who develop a health problem that would prevent them from having lung cancer treatment.  If you are pregnant, do not drink alcohol. If you are breastfeeding, be very cautious about drinking alcohol. If you are not pregnant and choose to drink alcohol, do not exceed 1 drink per day. One drink is considered to be 12 ounces (355 mL) of beer, 5 ounces (148 mL) of wine, or 1.5 ounces (44 mL) of liquor.  Avoid use of street drugs. Do not share needles with anyone. Ask for  help if you need support or instructions about stopping the use of drugs.  High blood pressure causes heart disease and increases the risk of stroke. Blood pressure should be checked at least every 1 to 2 years. Ongoing high blood pressure should be treated with medicines, if weight loss and exercise are not effective.  If you are 55 to 63 years old, ask your caregiver if you should take aspirin to prevent strokes.  Diabetes screening involves taking a blood sample to check your fasting blood sugar  level. This should be done once every 3 years, after age 45, if you are within normal weight and without risk factors for diabetes. Testing should be considered at a younger age or be carried out more frequently if you are overweight and have at least 1 risk factor for diabetes.  Breast cancer screening is essential preventative care for women. You should practice "breast self-awareness." This means understanding the normal appearance and feel of your breasts and may include breast self-examination. Any changes detected, no matter how small, should be reported to a caregiver. Women in their 20s and 30s should have a clinical breast exam (CBE) by a caregiver as part of a regular health exam every 1 to 3 years. After age 40, women should have a CBE every year. Starting at age 40, women should consider having a mammogram (breast X-ray) every year. Women who have a family history of breast cancer should talk to their caregiver about genetic screening. Women at a high risk of breast cancer should talk to their caregiver about having an MRI and a mammogram every year.  Breast cancer gene (BRCA)-related cancer risk assessment is recommended for women who have family members with BRCA-related cancers. BRCA-related cancers include breast, ovarian, tubal, and peritoneal cancers. Having family members with these cancers may be associated with an increased risk for harmful changes (mutations) in the breast cancer genes BRCA1 and BRCA2. Results of the assessment will determine the need for genetic counseling and BRCA1 and BRCA2 testing.  The Pap test is a screening test for cervical cancer. Women should have a Pap test starting at age 21. Between ages 21 and 29, Pap tests should be repeated every 2 years. Beginning at age 30, you should have a Pap test every 3 years as long as the past 3 Pap tests have been normal. If you had a hysterectomy for a problem that was not cancer or a condition that could lead to cancer, then  you no longer need Pap tests. If you are between ages 65 and 70, and you have had normal Pap tests going back 10 years, you no longer need Pap tests. If you have had past treatment for cervical cancer or a condition that could lead to cancer, you need Pap tests and screening for cancer for at least 20 years after your treatment. If Pap tests have been discontinued, risk factors (such as a new sexual partner) need to be reassessed to determine if screening should be resumed. Some women have medical problems that increase the chance of getting cervical cancer. In these cases, your caregiver may recommend more frequent screening and Pap tests.  The human papillomavirus (HPV) test is an additional test that may be used for cervical cancer screening. The HPV test looks for the virus that can cause the cell changes on the cervix. The cells collected during the Pap test can be tested for HPV. The HPV test could be used to screen women aged 30 years and older,   and should be used in women of any age who have unclear Pap test results. After the age of 30, women should have HPV testing at the same frequency as a Pap test.  Colorectal cancer can be detected and often prevented. Most routine colorectal cancer screening begins at the age of 50 and continues through age 75. However, your caregiver may recommend screening at an earlier age if you have risk factors for colon cancer. On a yearly basis, your caregiver may provide home test kits to check for hidden blood in the stool. Use of a small camera at the end of a tube, to directly examine the colon (sigmoidoscopy or colonoscopy), can detect the earliest forms of colorectal cancer. Talk to your caregiver about this at age 50, when routine screening begins. Direct examination of the colon should be repeated every 5 to 10 years through age 75, unless early forms of pre-cancerous polyps or small growths are found.  Hepatitis C blood testing is recommended for all people born  from 1945 through 1965 and any individual with known risks for hepatitis C.  Practice safe sex. Use condoms and avoid high-risk sexual practices to reduce the spread of sexually transmitted infections (STIs). Sexually active women aged 25 and younger should be checked for Chlamydia, which is a common sexually transmitted infection. Older women with new or multiple partners should also be tested for Chlamydia. Testing for other STIs is recommended if you are sexually active and at increased risk.  Osteoporosis is a disease in which the bones lose minerals and strength with aging. This can result in serious bone fractures. The risk of osteoporosis can be identified using a bone density scan. Women ages 65 and over and women at risk for fractures or osteoporosis should discuss screening with their caregivers. Ask your caregiver whether you should be taking a calcium supplement or vitamin D to reduce the rate of osteoporosis.  Menopause can be associated with physical symptoms and risks. Hormone replacement therapy is available to decrease symptoms and risks. You should talk to your caregiver about whether hormone replacement therapy is right for you.  Use sunscreen. Apply sunscreen liberally and repeatedly throughout the day. You should seek shade when your shadow is shorter than you. Protect yourself by wearing long sleeves, pants, a wide-brimmed hat, and sunglasses year round, whenever you are outdoors.  Notify your caregiver of new moles or changes in moles, especially if there is a change in shape or color. Also notify your caregiver if a mole is larger than the size of a pencil eraser.  Stay current with your immunizations. Document Released: 06/20/2011 Document Revised: 04/01/2013 Document Reviewed: 06/20/2011 [employer] Patient Information 2014 [employer].   

## 2014-12-18 LAB — URINALYSIS W MICROSCOPIC + REFLEX CULTURE
BACTERIA UA: NONE SEEN
Bilirubin Urine: NEGATIVE
CRYSTALS: NONE SEEN
Casts: NONE SEEN
Glucose, UA: NEGATIVE mg/dL
HGB URINE DIPSTICK: NEGATIVE
Ketones, ur: NEGATIVE mg/dL
Leukocytes, UA: NEGATIVE
NITRITE: NEGATIVE
PROTEIN: NEGATIVE mg/dL
Specific Gravity, Urine: 1.015 (ref 1.005–1.030)
UROBILINOGEN UA: 0.2 mg/dL (ref 0.0–1.0)
pH: 5.5 (ref 5.0–8.0)

## 2014-12-22 LAB — CYTOLOGY - PAP

## 2014-12-23 ENCOUNTER — Ambulatory Visit (INDEPENDENT_AMBULATORY_CARE_PROVIDER_SITE_OTHER): Payer: BC Managed Care – PPO

## 2014-12-23 DIAGNOSIS — M8580 Other specified disorders of bone density and structure, unspecified site: Secondary | ICD-10-CM

## 2014-12-24 ENCOUNTER — Encounter: Payer: Self-pay | Admitting: Gynecology

## 2015-08-15 ENCOUNTER — Encounter (HOSPITAL_COMMUNITY): Payer: Self-pay | Admitting: Emergency Medicine

## 2015-08-15 ENCOUNTER — Emergency Department (HOSPITAL_COMMUNITY): Payer: BC Managed Care – PPO

## 2015-08-15 ENCOUNTER — Inpatient Hospital Stay (HOSPITAL_COMMUNITY)
Admission: EM | Admit: 2015-08-15 | Discharge: 2015-08-18 | DRG: 418 | Disposition: A | Discharge status: Home / Self Care | Payer: BC Managed Care – PPO | Attending: Internal Medicine | Admitting: Internal Medicine

## 2015-08-15 ENCOUNTER — Inpatient Hospital Stay (HOSPITAL_COMMUNITY): Payer: BC Managed Care – PPO

## 2015-08-15 DIAGNOSIS — E878 Other disorders of electrolyte and fluid balance, not elsewhere classified: Secondary | ICD-10-CM | POA: Diagnosis present

## 2015-08-15 DIAGNOSIS — K859 Acute pancreatitis without necrosis or infection, unspecified: Secondary | ICD-10-CM | POA: Diagnosis present

## 2015-08-15 DIAGNOSIS — R110 Nausea: Secondary | ICD-10-CM | POA: Diagnosis present

## 2015-08-15 DIAGNOSIS — R740 Nonspecific elevation of levels of transaminase and lactic acid dehydrogenase [LDH]: Secondary | ICD-10-CM | POA: Diagnosis present

## 2015-08-15 DIAGNOSIS — E860 Dehydration: Secondary | ICD-10-CM | POA: Diagnosis present

## 2015-08-15 DIAGNOSIS — R7401 Elevation of levels of liver transaminase levels: Secondary | ICD-10-CM | POA: Diagnosis present

## 2015-08-15 DIAGNOSIS — K8020 Calculus of gallbladder without cholecystitis without obstruction: Secondary | ICD-10-CM

## 2015-08-15 DIAGNOSIS — J302 Other seasonal allergic rhinitis: Secondary | ICD-10-CM | POA: Diagnosis present

## 2015-08-15 DIAGNOSIS — K851 Biliary acute pancreatitis: Principal | ICD-10-CM | POA: Diagnosis present

## 2015-08-15 DIAGNOSIS — K219 Gastro-esophageal reflux disease without esophagitis: Secondary | ICD-10-CM | POA: Diagnosis not present

## 2015-08-15 DIAGNOSIS — E871 Hypo-osmolality and hyponatremia: Secondary | ICD-10-CM | POA: Diagnosis present

## 2015-08-15 DIAGNOSIS — R739 Hyperglycemia, unspecified: Secondary | ICD-10-CM | POA: Diagnosis not present

## 2015-08-15 DIAGNOSIS — K8010 Calculus of gallbladder with chronic cholecystitis without obstruction: Secondary | ICD-10-CM | POA: Diagnosis present

## 2015-08-15 DIAGNOSIS — K589 Irritable bowel syndrome without diarrhea: Secondary | ICD-10-CM | POA: Diagnosis present

## 2015-08-15 DIAGNOSIS — K529 Noninfective gastroenteritis and colitis, unspecified: Secondary | ICD-10-CM | POA: Diagnosis present

## 2015-08-15 DIAGNOSIS — F329 Major depressive disorder, single episode, unspecified: Secondary | ICD-10-CM | POA: Diagnosis present

## 2015-08-15 DIAGNOSIS — R109 Unspecified abdominal pain: Secondary | ICD-10-CM | POA: Diagnosis present

## 2015-08-15 DIAGNOSIS — D649 Anemia, unspecified: Secondary | ICD-10-CM | POA: Diagnosis not present

## 2015-08-15 DIAGNOSIS — Z79899 Other long term (current) drug therapy: Secondary | ICD-10-CM

## 2015-08-15 DIAGNOSIS — K6389 Other specified diseases of intestine: Secondary | ICD-10-CM | POA: Diagnosis not present

## 2015-08-15 LAB — URINALYSIS, ROUTINE W REFLEX MICROSCOPIC
Bilirubin Urine: NEGATIVE
GLUCOSE, UA: 250 mg/dL — AB
Hgb urine dipstick: NEGATIVE
KETONES UR: NEGATIVE mg/dL
LEUKOCYTES UA: NEGATIVE
NITRITE: NEGATIVE
PH: 8 (ref 5.0–8.0)
Protein, ur: NEGATIVE mg/dL
SPECIFIC GRAVITY, URINE: 1.02 (ref 1.005–1.030)
Urobilinogen, UA: 0.2 mg/dL (ref 0.0–1.0)

## 2015-08-15 LAB — CBC
HEMATOCRIT: 37.5 % (ref 36.0–46.0)
HEMATOCRIT: 34.3 % — AB (ref 36.0–46.0)
HEMOGLOBIN: 12.3 g/dL (ref 12.0–15.0)
Hemoglobin: 11.2 g/dL — ABNORMAL LOW (ref 12.0–15.0)
MCH: 28.8 pg (ref 26.0–34.0)
MCH: 28.4 pg (ref 26.0–34.0)
MCHC: 32.8 g/dL (ref 30.0–36.0)
MCHC: 32.7 g/dL (ref 30.0–36.0)
MCV: 87.8 fL (ref 78.0–100.0)
MCV: 86.8 fL (ref 78.0–100.0)
PLATELETS: 336 10*3/uL (ref 150–400)
Platelets: 325 10*3/uL (ref 150–400)
RBC: 4.27 MIL/uL (ref 3.87–5.11)
RBC: 3.95 MIL/uL (ref 3.87–5.11)
RDW: 13.9 % (ref 11.5–15.5)
RDW: 13.8 % (ref 11.5–15.5)
WBC: 7.9 10*3/uL (ref 4.0–10.5)
WBC: 8.9 10*3/uL (ref 4.0–10.5)

## 2015-08-15 LAB — COMPREHENSIVE METABOLIC PANEL
ALBUMIN: 3.8 g/dL (ref 3.5–5.0)
ALK PHOS: 155 U/L — AB (ref 38–126)
ALT: 1208 U/L — ABNORMAL HIGH (ref 14–54)
ANION GAP: 7 (ref 5–15)
AST: 1662 U/L — ABNORMAL HIGH (ref 15–41)
BILIRUBIN TOTAL: 1.1 mg/dL (ref 0.3–1.2)
BUN: 9 mg/dL (ref 6–20)
CALCIUM: 9 mg/dL (ref 8.9–10.3)
CO2: 29 mmol/L (ref 22–32)
Chloride: 98 mmol/L — ABNORMAL LOW (ref 101–111)
Creatinine, Ser: 0.68 mg/dL (ref 0.44–1.00)
GFR calc non Af Amer: >60 mL/min (ref >60)
Glucose, Bld: 229 mg/dL — ABNORMAL HIGH (ref 65–99)
POTASSIUM: 4.2 mmol/L (ref 3.5–5.1)
SODIUM: 134 mmol/L — AB (ref 135–145)
TOTAL PROTEIN: 7.4 g/dL (ref 6.5–8.1)

## 2015-08-15 LAB — AMYLASE: Amylase: 1999 U/L — ABNORMAL HIGH (ref 28–100)

## 2015-08-15 LAB — CREATININE, SERUM
Creatinine, Ser: 0.6 mg/dL (ref 0.44–1.00)
GFR calc Af Amer: >60 mL/min (ref >60)
GFR calc non Af Amer: >60 mL/min (ref >60)

## 2015-08-15 LAB — LIPASE, BLOOD: Lipase: >3000 U/L — ABNORMAL HIGH (ref 22–51)

## 2015-08-15 LAB — LACTATE DEHYDROGENASE: LDH: 960 U/L — ABNORMAL HIGH (ref 98–192)

## 2015-08-15 LAB — CBG MONITORING, ED: Glucose-Capillary: 135 mg/dL — ABNORMAL HIGH (ref 65–99)

## 2015-08-15 MED ORDER — MORPHINE SULFATE (PF) 2 MG/ML IV SOLN
2.0000 mg | INTRAVENOUS | Status: DC | PRN
  Administered 2015-08-16 (×3): 2 mg via INTRAVENOUS
  Filled 2015-08-15 (×3): qty 1

## 2015-08-15 MED ORDER — HYDROMORPHONE HCL 1 MG/ML IJ SOLN: 1.0000 mg | INTRAMUSCULAR | Status: DC | PRN

## 2015-08-15 MED ORDER — ONDANSETRON HCL 4 MG PO TABS
4.0000 mg | ORAL_TABLET | Freq: Four times a day (QID) | ORAL | Status: DC | PRN
Start: 2015-08-15 — End: 2015-08-18

## 2015-08-15 MED ORDER — SODIUM CHLORIDE 0.9 % IV SOLN
INTRAVENOUS | Status: AC
  Administered 2015-08-15: 11:00:00 via INTRAVENOUS

## 2015-08-15 MED ORDER — IOHEXOL 300 MG/ML  SOLN
100.0000 mL | Freq: Once | INTRAMUSCULAR | Status: AC | PRN
  Administered 2015-08-15: 100 mL via INTRAVENOUS

## 2015-08-15 MED ORDER — SODIUM CHLORIDE 0.9 % IV BOLUS (SEPSIS)
1000.0000 mL | Freq: Once | INTRAVENOUS | Status: AC
  Administered 2015-08-15: 1000 mL via INTRAVENOUS

## 2015-08-15 MED ORDER — ENOXAPARIN SODIUM 40 MG/0.4ML ~~LOC~~ SOLN
40.0000 mg | SUBCUTANEOUS | Status: DC
  Administered 2015-08-15 – 2015-08-16 (×2): 40 mg via SUBCUTANEOUS
  Filled 2015-08-15 (×2): qty 0.4

## 2015-08-15 MED ORDER — SODIUM CHLORIDE 0.9 % IV SOLN
INTRAVENOUS | Status: DC
  Administered 2015-08-15 – 2015-08-16 (×2): via INTRAVENOUS
  Administered 2015-08-16: 100 mL via INTRAVENOUS
  Administered 2015-08-16 – 2015-08-18 (×2): via INTRAVENOUS

## 2015-08-15 MED ORDER — HYDROMORPHONE HCL 1 MG/ML IJ SOLN
1.0000 mg | Freq: Once | INTRAMUSCULAR | Status: AC
  Administered 2015-08-15: 1 mg via INTRAVENOUS
  Filled 2015-08-15: qty 1

## 2015-08-15 MED ORDER — ONDANSETRON HCL 4 MG/2ML IJ SOLN
4.0000 mg | Freq: Once | INTRAMUSCULAR | Status: AC
  Administered 2015-08-15: 4 mg via INTRAVENOUS
  Filled 2015-08-15: qty 2

## 2015-08-15 MED ORDER — MORPHINE SULFATE (PF) 4 MG/ML IV SOLN
4.0000 mg | Freq: Once | INTRAVENOUS | Status: AC
  Administered 2015-08-15: 4 mg via INTRAVENOUS
  Filled 2015-08-15: qty 1

## 2015-08-15 MED ORDER — ONDANSETRON HCL 4 MG/2ML IJ SOLN
4.0000 mg | Freq: Four times a day (QID) | INTRAMUSCULAR | Status: DC | PRN
  Administered 2015-08-15: 4 mg via INTRAVENOUS
  Filled 2015-08-15: qty 2

## 2015-08-15 MED ORDER — IOHEXOL 300 MG/ML  SOLN
25.0000 mL | Freq: Once | INTRAMUSCULAR | Status: AC | PRN
  Administered 2015-08-15: 25 mL via ORAL

## 2015-08-15 MED ORDER — PANTOPRAZOLE SODIUM 40 MG IV SOLR
40.0000 mg | INTRAVENOUS | Status: DC
  Administered 2015-08-15 – 2015-08-16 (×2): 40 mg via INTRAVENOUS
  Filled 2015-08-15: qty 40

## 2015-08-15 NOTE — H&P (Signed)
[facility] History and Physical  Rhonda Norris ZOX:[id number] DOB: December 11, 1951 DOA: 08/15/2015  Referring physician: Ellin Saba PA PCP: Mickie Hillier, MD   Chief Complaint: Abdominal pain and nausea  HPI: Rhonda Norris is a 64 y.o. female with past medical history significant for ulcerative arthritis, IBS/IBD, gastroesophageal reflux disease, depression and seasonal allergic rhinitis; who presented to the emergency department secondary to abdominal pain and nausea. Patient reports that her pain started Thursday night (08/13/15) after her dinner, has continue steadily worsening since then. The pain is localizing her mid abdomen and radiates across toward her back and left upper quadrant/left lower quadrant; she endorses some associated reflux and nausea along with the pain but denies any vomiting. Patient also endorses some worsening of her discomfort with food intake. She denies chest pain, shortness of breath, cough, hematuria, dysuria, melena, hematochezia, headaches, blurred vision, focal weakness or any other acute complaints. In the ED workup has demonstrated lipase above 3000, transaminitis, hyperglycemia with a CBG in the 229 range and also mild signs of dehydration on physical exam with mild hyponatremia/hypochloremia. [facility] has been called to admit the patient for further evaluation and treatment of acute pancreatitis.  Review of Systems:  Negative except as otherwise mentioned in history of present illness  Past Medical History  Diagnosis Date  . IBD (inflammatory bowel disease)   . Arthritis   . Carpal tunnel syndrome   . Osteopenia 12/2013    T score -2.0 FRAX 8.7%/0.8%   Past Surgical History  Procedure Laterality Date  . Appendectomy    . Hand surgery      bilateral  . Knee arthroscopy      rt  . Neck surgery    . Tubal ligation    . 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 Vaginal hysterectomy bilateral  salpingo-oophorectomy  11/1996    [facility] BSO endometriosis/leiomyomata  . Pelvic laparoscopy  1992/1996    endometriosis  . Colposcopy     Social History:  reports that she has never smoked. She does not have any smokeless tobacco history on file. She reports that she does not drink alcohol or use illicit drugs.  Allergies  Allergen Reactions  . Sulfur Hives    Family History  Problem Relation Age of Onset  . Heart disease Mother   . Seizures Mother   . Heart disease Brother     Prior to Admission medications   Medication Sig Start Date End Date Taking? Authorizing Provider  cetirizine (ZYRTEC) 10 MG tablet Take 10 mg by mouth daily.   Yes Historical Provider, MD  Multiple Vitamin (MULTIVITAMIN WITH MINERALS) TABS tablet Take 1 tablet by mouth daily.   Yes Historical Provider, MD  nortriptyline (PAMELOR) 25 MG capsule Take 1 capsule by mouth daily. 10/14/14  Yes Historical Provider, MD  ranitidine (ZANTAC) 150 MG tablet Take 150 mg by mouth daily as needed for heartburn.   Yes Historical Provider, MD   Physical Exam: Filed Vitals:   08/15/15 0945 08/15/15 1000 08/15/15 1015 08/15/15 1030  BP: 125/71 139/76 133/70 133/76  Pulse: 81 72 70 71  Temp:      TempSrc:      Resp:      Height:      Weight:      SpO2: 99% 100% 96% 97%    Wt Readings from Last 3 Encounters:  08/15/15 74.844 kg (165 lb)  12/17/14 74.39 kg (164 lb)  09/16/13 74.39  kg (164 lb)    General:  Appears somewhat uncomfortable, but in no acute distress. Denies chest pain, shortness of breath, fever, active vomiting and main complaint is abdominal pain (5/10 in intensity) and associated nausea. Patient is oriented 3 and able to follow commands. Eyes: PERRL, normal lids, irises & conjunctiva, no icterus, no nystagmus ENT: grossly normal hearing, mild mucous membrane dryness, no erythema, no exudates, no thrush; no drainage out of her ears or nostrils Neck: no LAD, masses or thyromegaly, no JVD Cardiovascular:  RRR, no m/r/g. No LE edema. Respiratory: CTA bilaterally, no w/r/r. Normal respiratory effort. Abdomen: soft, no guarding; tender to palpation mid epigastric area with radiation to her left upper quadrant; positive bowel sounds and no distention appreciated Skin: no rash or induration seen on exam Musculoskeletal: grossly normal tone BUE/BLE; full range of motion Psychiatric: grossly normal mood and affect, speech fluent and appropriate Neurologic: grossly non-focal.          Labs on Admission:  Basic Metabolic Panel:  Recent Labs Lab 08/15/15 0315  NA 134*  K 4.2  CL 98*  CO2 29  GLUCOSE 229*  BUN 9  CREATININE 0.68  CALCIUM 9.0   Liver Function Tests:  Recent Labs Lab 08/15/15 0315  AST 1662*  ALT 1208*  ALKPHOS 155*  BILITOT 1.1  PROT 7.4  ALBUMIN 3.8    Recent Labs Lab 08/15/15 0315  LIPASE >3000*   CBC:  Recent Labs Lab 08/15/15 0315  WBC 7.9  HGB 12.3  HCT 37.5  MCV 87.8  PLT 325    CBG:  Recent Labs Lab 08/15/15 0950  GLUCAP 135*    Radiological Exams on Admission: Ct Abdomen Pelvis W Contrast  08/15/2015   CLINICAL DATA:  Left-sided abdominal pain radiating to the epigastric area. Symptoms began yesterday.  EXAM: CT ABDOMEN AND PELVIS WITH CONTRAST  TECHNIQUE: Multidetector CT imaging of the abdomen and pelvis was performed using the standard protocol following bolus administration of intravenous contrast.  CONTRAST:  OMNIPAQUE IOHEXOL 300 MG/ML  SOLN  COMPARISON:  CT abdomen pelvis -08/30/2010  FINDINGS: Normal hepatic contour. Punctate calcification within the left lobe of the liver (image 25, series 201), likely the sequela of prior granulomatous infection. No discrete hepatic lesions. Normal appearance of the gallbladder. No radiopaque gallstones. No intra extrahepatic biliary duct dilatation. No ascites.  There is symmetric enhancement and excretion of the bilateral kidneys. 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 No evidence urinary obstruction or perinephric stranding. No renal stones. Normal appearance of the bilateral adrenal glands, pancreas and spleen.  Ingested enteric contrast extends to the level of the mid/distal small bowel. The descending colon is underdistended. Moderate colonic stool burden within the cecum and ascending colon. No evidence of enteric obstruction. The appendix is not visualized compatible with provided surgical history. No pneumoperitoneum, pneumatosis or portal venous gas.  Scattered minimal amount of atherosclerotic plaque within a normal caliber abdominal aorta. The major branch vessels of the abdominal aorta appear patent on this non CTA examination.  No bulky retroperitoneal, mesenteric, pelvic or inguinal lymphadenopathy.  Post hysterectomy. Normal appearance of the urinary bladder given degree distention. No discrete adnexal lesion. Several phleboliths are seen with the lower pelvis bilaterally. No free fluid within the pelvic cul-de-sac.  Limited  visualization of lower thorax demonstrates minimal dependent subpleural ground-glass atelectasis. No discrete focal airspace opacities. No pleural effusion.  Punctate (approximately 4 and 3 mm nodules) within the right middle lobe (representative images 2 and 4, series 205) are unchanged since the 2011 examination and thus of benign etiology.  Normal heart size.  No pericardial effusion.  No acute or aggressive osseous abnormalities. Mild degenerative change of the pubic symphysis.  Regional soft tissues appear normal.  IMPRESSION: 1. No definite explanation for patient's radiating left-sided abdominal pain. 2. 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 No evidence of acute urinary obstruction.   Electronically Signed   By: Simonne Come M.D.   On: 08/15/2015 09:17    EKG:  None  Assessment/Plan 1-acute Pancreatitis: Etiology unclear at this moment. No history of alcohol intake and no gallstones appreciated on CT scan. Patient is not taking any statins or other medication that could be link as the etiology for pancreatitis. -Will admit to MedSurg -Will check amylase, LDH and follow lipase trend -Will provide fluid resuscitation, PRN antiemetics and PRN analgesics -Patient will be kept nothing by mouth for bowel rest -Will check hepatitis panel and also right upper quadrant ultrasound -We will follow clinical response and determine further needs of treatment and interventions. -No fever, no elevation of her WBCs and no seizures/pseudoseizures appreciated on CT scan. Will hold antibiotics  2-Nausea: As mentioned above will use PRN antiemetics  3-Hyperglycemia: No prior history of diabetes. -Copy associated to current acute pancreatitis -Will check hemoglobin A1c and repeat fasting CBG in a.m.  4-IBS: Patient with history of IBS. Denies any diarrhea or vomiting -Will provide supportive care for acute pancreatitis and I will also help any potential component from her IBS. -She reports last bowel movement to be the night prior to admission.  5-Transaminitis: Unclear etiology. Blood potentially related to medications especially Pamelor -Will hold Pamelor -Will check right upper quadrant ultrasound and hepatitis panel -Will provide fluid resuscitation and follow LFTs trend  6-GERD without esophagitis: Will use PPI  7-depression: At this particular moment Pamelor will be held. -Patient without suicidal  ideation or hallucinations -Will follow clinical response and determine future treatment as needed  Code Status: Full code DVT Prophylaxis: Lovenox Family Communication: Daughter at bedside Disposition Plan: LOS more than 2 midnights, inpatient, MedSurg  Time spent: 65 minutes  Vassie Loll [facility] Pager [phone]

## 2015-08-15 NOTE — ED Notes (Signed)
Patient here with complaint of abdominal pain. States onset yesterday. History of IBS, but hasn't had any symptoms for "about 5 years". LUQ pain radiates towards LLQ, and then moves medially. Denies emesis but states nausea.

## 2015-08-15 NOTE — ED Notes (Signed)
Patient watching television and talking to visitor at bedside; no needs at this time

## 2015-08-15 NOTE — ED Provider Notes (Signed)
CSN: [id number]     Arrival date & time 08/15/15  0303 History   First MD Initiated Contact with Patient 08/15/15 [phone]     Chief Complaint  Patient presents with  . Abdominal Pain     (Consider location/radiation/quality/duration/timing/severity/associated sxs/prior Treatment) HPI   PCP: Mickie Hillier, MD Blood pressure 125/71, pulse 81, temperature 98.7 F (37.1 C), temperature source Oral, resp. rate 16, height 5\' 4"  (1.626 m), weight 165 lb (74.844 kg), SpO2 99 %.  Rhonda Norris is a 64 y.o.female with a significant PMH of irritable bowel disease, arthritis, carpal tunnel syndrome, osteopenia presents to the ER with complaints of abdominal pain that started this past Thursday. Started rather mild and then progressively worsened to where her pain is moderate all over but most severe in the epigastric and left upper quadrant. She denies having any diarrhea or vomiting. She reports she has been having normal stools. Her pain changes from a pressure to sharp shooting and is at times unbearable.  The patient denies diaphoresis, fever, headache, weakness (general or focal), confusion, change of vision,  neck pain, dysphagia, aphagia, chest pain, shortness of breath,  back pain, nausea, vomiting, diarrhea, lower extremity swelling, rash.  Past Medical History  Diagnosis Date  . IBD (inflammatory bowel disease)   . Arthritis   . Carpal tunnel syndrome   . Osteopenia 12/2013    T score -2.0 FRAX 8.7%/0.8%   Past Surgical History  Procedure Laterality Date  . Appendectomy    . Hand surgery      bilateral  . Knee arthroscopy      rt  . Neck surgery    . Tubal ligation    . 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 Vaginal hysterectomy bilateral salpingo-oophorectomy  11/1996    TVH BSO endometriosis/leiomyomata  . Pelvic laparoscopy  1992/1996    endometriosis  . Colposcopy     Family History  Problem Relation Age of Onset  . Heart disease Mother    . Seizures Mother   . Heart disease Brother    Social History  Substance Use Topics  . Smoking status: Never Smoker   . Smokeless tobacco: None  . Alcohol Use: No   OB History    Gravida Para Term Preterm AB TAB SAB Ectopic Multiple Living   3 3        3      Review of Systems  10 Systems reviewed and are negative for acute change except as noted in the HPI.     Allergies  Sulfur  Home Medications   Prior to Admission medications   Medication Sig Start Date End Date Taking? Authorizing Provider  cetirizine (ZYRTEC) 10 MG tablet Take 10 mg by mouth daily.   Yes Historical Provider, MD  Multiple Vitamin (MULTIVITAMIN WITH MINERALS) TABS tablet Take 1 tablet by mouth daily.   Yes Historical Provider, MD  nortriptyline (PAMELOR) 25 MG capsule Take 1 capsule by mouth daily. 10/14/14  Yes Historical Provider, MD  ranitidine (ZANTAC) 150 MG tablet Take 150 mg by mouth daily as needed for heartburn.   Yes Historical Provider, MD   BP 125/71 mmHg  Pulse 81  Temp(Src) 98.7 F (37.1 C) (Oral)  Resp 16  Ht 5\' 4"  (1.626 m)  Wt 165 lb (74.844 kg)  BMI 28.31 kg/m2  SpO2 99% Physical Exam  Constitutional: She appears well-developed and well-nourished. She appears ill. No distress.  HENT:  Head: Normocephalic and  atraumatic.  Eyes: Pupils are equal, round, and reactive to light.  Neck: Normal range of motion. Neck supple.  Cardiovascular: Normal rate and regular rhythm.   Pulmonary/Chest: Effort normal.  Abdominal: Soft. Bowel sounds are normal. She exhibits no distension. There is tenderness in the right upper quadrant, epigastric area and periumbilical area. There is guarding. There is no rebound and no CVA tenderness. No hernia.  Neurological: She is alert.  Skin: Skin is warm and dry.  Nursing note and vitals reviewed.   ED Course  Procedures (including critical care time) Labs Review Labs Reviewed  LIPASE, BLOOD - Abnormal; Notable for the following:    Lipase >3000  (*)    All other components within normal limits  COMPREHENSIVE METABOLIC PANEL - Abnormal; Notable for the following:    Sodium 134 (*)    Chloride 98 (*)    Glucose, Bld 229 (*)    AST 1662 (*)    ALT 1208 (*)    Alkaline Phosphatase 155 (*)    All other components within normal limits  URINALYSIS, ROUTINE W REFLEX MICROSCOPIC (NOT AT [facility]) - Abnormal; Notable for the following:    APPearance CLOUDY (*)    Glucose, UA 250 (*)    All other components within normal limits  CBG MONITORING, ED - Abnormal; Notable for the following:    Glucose-Capillary 135 (*)    All other components within normal limits  CBC    Imaging Review Ct Abdomen Pelvis W Contrast  08/15/2015   CLINICAL DATA:  Left-sided abdominal pain radiating to the epigastric area. Symptoms began yesterday.  EXAM: CT ABDOMEN AND PELVIS WITH CONTRAST  TECHNIQUE: Multidetector CT imaging of the abdomen and pelvis was performed using the standard protocol following bolus administration of intravenous contrast.  CONTRAST:  OMNIPAQUE IOHEXOL 300 MG/ML  SOLN  COMPARISON:  CT abdomen pelvis -08/30/2010  FINDINGS: Normal hepatic contour. Punctate calcification within the left lobe of the liver (image 25, series 201), likely the sequela of prior granulomatous infection. No discrete hepatic lesions. Normal appearance of the gallbladder. No radiopaque gallstones. No intra extrahepatic biliary duct dilatation. No ascites.  There is symmetric enhancement and excretion of the bilateral kidneys. 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 No evidence urinary obstruction or perinephric stranding. No renal stones. Normal appearance of the bilateral adrenal glands,  pancreas and spleen.  Ingested enteric contrast extends to the level of the mid/distal small bowel. The descending colon is underdistended. Moderate colonic stool burden within the cecum and ascending colon. No evidence of enteric obstruction. The appendix is not visualized compatible with provided surgical history. No pneumoperitoneum, pneumatosis or portal venous gas.  Scattered minimal amount of atherosclerotic plaque within a normal caliber abdominal aorta. The major branch vessels of the abdominal aorta appear patent on this non CTA examination.  No bulky retroperitoneal, mesenteric, pelvic or inguinal lymphadenopathy.  Post hysterectomy. Normal appearance of the urinary bladder given degree distention. No discrete adnexal lesion. Several phleboliths are seen with the lower pelvis bilaterally. No free fluid within the pelvic cul-de-sac.  Limited visualization of lower thorax demonstrates minimal dependent subpleural ground-glass atelectasis. No discrete focal airspace opacities. No pleural effusion.  Punctate (approximately 4 and 3 mm nodules) within the right middle lobe (representative images 2 and 4,  series 205) are unchanged since the 2011 examination and thus of benign etiology.  Normal heart size.  No pericardial effusion.  No acute or aggressive osseous abnormalities. Mild degenerative change of the pubic symphysis.  Regional soft tissues appear normal.  IMPRESSION: 1. No definite explanation for patient's radiating left-sided abdominal pain. 2. 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 No evidence of acute urinary obstruction.   Electronically Signed   By: Simonne Come M.D.   On: 08/15/2015 09:17   I have personally reviewed and evaluated  these images and lab results as part of my medical decision-making.   EKG Interpretation None      MDM   Final diagnoses:  Acute pancreatitis, unspecified pancreatitis type    No UTI, Lipase > 3,000 and liver enzymes AST and ALT are greater than 1,000. Normal CBC. CT abd/pelv does not show any dilation of the bile duct or obstructing stone. No masses or perforation.   I spoke with Dr. Higinio Roger with [facility] who has agreed to admit the patient. Inpatient, Med-Surg, [facility]  Patient made aware of the abnormal findings and plan for admission. She is agreeable  Medications  0.9 %  sodium chloride infusion ( Intravenous New Bag/Given 08/15/15 1039)  HYDROmorphone (DILAUDID) injection 1 mg (not administered)  morphine 4 MG/ML injection 4 mg (4 mg Intravenous Given 08/15/15 0736)  ondansetron (ZOFRAN) injection 4 mg (4 mg Intravenous Given 08/15/15 0736)  sodium chloride 0.9 % bolus 1,000 mL (0 mLs Intravenous Stopped 08/15/15 0836)  iohexol (OMNIPAQUE) 300 MG/ML solution 25 mL (25 mLs Oral Contrast Given 08/15/15 0730)  morphine 4 MG/ML injection 4 mg (4 mg Intravenous Given 08/15/15 0900)  iohexol (OMNIPAQUE) 300 MG/ML solution 100 mL (100 mLs Intravenous Contrast Given 08/15/15 0829)  HYDROmorphone (DILAUDID) injection 1 mg (1 mg Intravenous Given 08/15/15 1038)    Filed Vitals:   08/15/15 1030  BP: 133/76  Pulse: 71  Temp:   Resp:       Marlon Pel, PA-C 08/15/15 1042  Alvira Monday, MD 08/17/15 1551

## 2015-08-16 DIAGNOSIS — R110 Nausea: Secondary | ICD-10-CM

## 2015-08-16 DIAGNOSIS — K6389 Other specified diseases of intestine: Secondary | ICD-10-CM

## 2015-08-16 DIAGNOSIS — K219 Gastro-esophageal reflux disease without esophagitis: Secondary | ICD-10-CM

## 2015-08-16 DIAGNOSIS — R740 Nonspecific elevation of levels of transaminase and lactic acid dehydrogenase [LDH]: Secondary | ICD-10-CM

## 2015-08-16 DIAGNOSIS — K859 Acute pancreatitis, unspecified: Secondary | ICD-10-CM

## 2015-08-16 DIAGNOSIS — R739 Hyperglycemia, unspecified: Secondary | ICD-10-CM

## 2015-08-16 LAB — COMPREHENSIVE METABOLIC PANEL WITH GFR
ALT: 630 U/L — ABNORMAL HIGH (ref 14–54)
AST: 322 U/L — ABNORMAL HIGH (ref 15–41)
Albumin: 3.5 g/dL (ref 3.5–5.0)
Alkaline Phosphatase: 123 U/L (ref 38–126)
Anion gap: 10 (ref 5–15)
BUN: 6 mg/dL (ref 6–20)
CO2: 22 mmol/L (ref 22–32)
Calcium: 8.6 mg/dL — ABNORMAL LOW (ref 8.9–10.3)
Chloride: 107 mmol/L (ref 101–111)
Creatinine, Ser: 0.59 mg/dL (ref 0.44–1.00)
GFR calc Af Amer: >60 mL/min
GFR calc non Af Amer: >60 mL/min
Glucose, Bld: 61 mg/dL — ABNORMAL LOW (ref 65–99)
Potassium: 4 mmol/L (ref 3.5–5.1)
Sodium: 139 mmol/L (ref 135–145)
Total Bilirubin: 0.7 mg/dL (ref 0.3–1.2)
Total Protein: 6.7 g/dL (ref 6.5–8.1)

## 2015-08-16 LAB — CBC
HCT: 31.5 % — ABNORMAL LOW (ref 36.0–46.0)
HEMOGLOBIN: 9.8 g/dL — AB (ref 12.0–15.0)
MCH: 27.5 pg (ref 26.0–34.0)
MCHC: 31.1 g/dL (ref 30.0–36.0)
MCV: 88.5 fL (ref 78.0–100.0)
Platelets: 297 10*3/uL (ref 150–400)
RBC: 3.56 MIL/uL — AB (ref 3.87–5.11)
RDW: 14.6 % (ref 11.5–15.5)
WBC: 8.4 10*3/uL (ref 4.0–10.5)

## 2015-08-16 LAB — HEPATITIS PANEL, ACUTE
HCV Ab: <0.1 {s_co_ratio} (ref 0.0–0.9)
Hep A IgM: NEGATIVE
Hep B C IgM: NEGATIVE
Hepatitis B Surface Ag: NEGATIVE

## 2015-08-16 LAB — LIPASE, BLOOD: Lipase: 58 U/L — ABNORMAL HIGH (ref 22–51)

## 2015-08-16 NOTE — Consult Note (Signed)
Reason for Consult:  Pancreatitis with cholelithiaisis Referring Physician: Dr. Jacqualyn Posey B Bohall is an 64 y.o. female.  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  She says it started in her lower left abdomen and went all over, but she points to LUQ.  No nausea, vomiting, no diarrhea, no constipation, no blood in her stools.  She had no fever, and has never had this issue before.  She thought it was her IBS initially but it did not improve and she came to the ED for evaluation yesterday AM.  Work up shows she is afebrile, VSS.  Lipase is greater than 3000.  AST and ALT were also elevated.  Repeat lipase 9 hours later showed lipase down to 1999.  CT scan yesterday showed no explanation for left sided pain. Ultrasound shows Multiple gallstones, no wall thickening, CBD was normal.  No mention of the pancreas. Currently she is pain free on pain meds, pain still comes and goes.  Controlled with pain meds.   Past Medical History  Diagnosis Date  . IBD (inflammatory bowel disease)   . Arthritis   . Carpal tunnel syndrome   . Osteopenia 12/2013    T score -2.0 FRAX 8.7%/0.8%    Past Surgical History  Procedure Laterality Date  . Appendectomy    . Hand surgery      bilateral  . Knee arthroscopy      rt  . Neck surgery    . Tubal ligation    . 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 Vaginal hysterectomy bilateral salpingo-oophorectomy  11/1996    TVH BSO endometriosis/leiomyomata  . Pelvic laparoscopy  1992/1996    endometriosis  . Colposcopy      Family History  Problem Relation Age of Onset  . Heart disease Mother   . Seizures Mother   . Heart disease Brother     Social History:  reports that she has never smoked. She does not have any smokeless tobacco history on file. She reports that she does not drink alcohol or use illicit drugs.   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 Sulfur Hives    Medications:  Prior to Admission:  Prescriptions prior to admission  Medication Sig Dispense Refill Last Dose  . cetirizine (ZYRTEC) 10 MG tablet Take 10 mg by mouth daily.   08/14/2015 at Unknown time  . Multiple Vitamin (MULTIVITAMIN WITH MINERALS) TABS tablet Take 1 tablet by mouth daily.   08/14/2015 at Unknown time  . nortriptyline (PAMELOR) 25 MG capsule Take 1 capsule by mouth daily.   08/14/2015 at Unknown time  . ranitidine (ZANTAC) 150 MG tablet Take 150 mg by mouth daily as needed for heartburn.   08/15/2015 at Unknown time   Scheduled: . enoxaparin (LOVENOX) injection  40 mg Subcutaneous Q24H  . pantoprazole (PROTONIX) IV  40 mg Intravenous Q24H   Continuous: . sodium chloride 100 mL/hr at 08/16/15 0223   AVW:[id number] injection, ondansetron **OR** ondansetron (ZOFRAN) IV Anti-infectives    None      Results for orders placed or performed during the hospital encounter of 08/15/15 (from the past 48 hour(s))  Lipase, blood     Status: Abnormal   Collection Time: 08/15/15  3:15 AM  Result Value Ref Range   Lipase >3000 (H) 22 -  51 U/L    Comment: RESULTS CONFIRMED BY MANUAL DILUTION  Comprehensive metabolic panel     Status: Abnormal   Collection Time: 08/15/15  3:15 AM  Result Value Ref Range   Sodium 134 (L) 135 - 145 mmol/L   Potassium 4.2 3.5 - 5.1 mmol/L   Chloride 98 (L) 101 - 111 mmol/L   CO2 29 22 - 32 mmol/L   Glucose, Bld 229 (H) 65 - 99 mg/dL   BUN 9 6 - 20 mg/dL   Creatinine, Ser 0.68 0.44 - 1.00 mg/dL   Calcium 9.0 8.9 - 10.3 mg/dL   Total Protein 7.4 6.5 - 8.1 g/dL   Albumin 3.8 3.5 - 5.0 g/dL   AST 1662 (H) 15 - 41 U/L   ALT 1208 (H) 14 - 54 U/L   Alkaline Phosphatase 155 (H) 38 - 126 U/L   Total Bilirubin 1.1 0.3 - 1.2 mg/dL   GFR calc non Af Amer >60 >60 mL/min   GFR calc Af Amer >60 >60 mL/min    Comment: (NOTE) The eGFR has been calculated using the CKD EPI  equation. This calculation has not been validated in all clinical situations. eGFR's persistently <60 mL/min signify possible Chronic Kidney Disease.    Anion gap 7 5 - 15  CBC     Status: None   Collection Time: 08/15/15  3:15 AM  Result Value Ref Range   WBC 7.9 4.0 - 10.5 K/uL   RBC 4.27 3.87 - 5.11 MIL/uL   Hemoglobin 12.3 12.0 - 15.0 g/dL   HCT 37.5 36.0 - 46.0 %   MCV 87.8 78.0 - 100.0 fL   MCH 28.8 26.0 - 34.0 pg   MCHC 32.8 30.0 - 36.0 g/dL   RDW 13.9 11.5 - 15.5 %   Platelets 325 150 - 400 K/uL  Urinalysis, Routine w reflex microscopic (not at [facility])     Status: Abnormal   Collection Time: 08/15/15  7:39 AM  Result Value Ref Range   Color, Urine YELLOW YELLOW   APPearance CLOUDY (A) CLEAR   Specific Gravity, Urine 1.020 1.005 - 1.030   pH 8.0 5.0 - 8.0   Glucose, UA 250 (A) NEGATIVE mg/dL   Hgb urine dipstick NEGATIVE NEGATIVE   Bilirubin Urine NEGATIVE NEGATIVE   Ketones, ur NEGATIVE NEGATIVE mg/dL   Protein, ur NEGATIVE NEGATIVE mg/dL   Urobilinogen, UA 0.2 0.0 - 1.0 mg/dL   Nitrite NEGATIVE NEGATIVE   Leukocytes, UA NEGATIVE NEGATIVE    Comment: MICROSCOPIC NOT DONE ON URINES WITH NEGATIVE PROTEIN, BLOOD, LEUKOCYTES, NITRITE, OR GLUCOSE <1000 mg/dL.  CBG monitoring, ED     Status: Abnormal   Collection Time: 08/15/15  9:50 AM  Result Value Ref Range   Glucose-Capillary 135 (H) 65 - 99 mg/dL  Lactate dehydrogenase     Status: Abnormal   Collection Time: 08/15/15 12:00 PM  Result Value Ref Range   LDH 960 (H) 98 - 192 U/L  Hepatitis panel, acute     Status: None   Collection Time: 08/15/15 12:00 PM  Result Value Ref Range   Hepatitis B Surface Ag Negative Negative   HCV Ab <0.1 0.0 - 0.9 s/co ratio    Comment: (NOTE)                                  Negative:     < 0.8  Indeterminate: 0.8 - 0.9                                  Positive:     > 0.9 The CDC recommends that a positive HCV antibody result be followed up with a HCV  Nucleic Acid Amplification test (607371). Performed At: [facility] [place], [place] [id number] Lindon Romp MD OE:[id number]    Hep A IgM Negative Negative   Hep B C IgM Negative Negative  CBC     Status: Abnormal   Collection Time: 08/15/15 12:00 PM  Result Value Ref Range   WBC 8.9 4.0 - 10.5 K/uL   RBC 3.95 3.87 - 5.11 MIL/uL   Hemoglobin 11.2 (L) 12.0 - 15.0 g/dL   HCT 34.3 (L) 36.0 - 46.0 %   MCV 86.8 78.0 - 100.0 fL   MCH 28.4 26.0 - 34.0 pg   MCHC 32.7 30.0 - 36.0 g/dL   RDW 13.8 11.5 - 15.5 %   Platelets 336 150 - 400 K/uL  Creatinine, serum     Status: None   Collection Time: 08/15/15 12:00 PM  Result Value Ref Range   Creatinine, Ser 0.60 0.44 - 1.00 mg/dL   GFR calc non Af Amer >60 >60 mL/min   GFR calc Af Amer >60 >60 mL/min    Comment: (NOTE) The eGFR has been calculated using the CKD EPI equation. This calculation has not been validated in all clinical situations. eGFR's persistently <60 mL/min signify possible Chronic Kidney Disease.   Amylase     Status: Abnormal   Collection Time: 08/15/15 12:00 PM  Result Value Ref Range   Amylase 1999 (H) 28 - 100 U/L    Comment: RESULTS CONFIRMED BY MANUAL DILUTION    Ct Abdomen Pelvis W Contrast  08/15/2015   CLINICAL DATA:  Left-sided abdominal pain radiating to the epigastric area. Symptoms began yesterday.  EXAM: CT ABDOMEN AND PELVIS WITH CONTRAST  TECHNIQUE: Multidetector CT imaging of the abdomen and pelvis was performed using the standard protocol following bolus administration of intravenous contrast.  CONTRAST:  189m OMNIPAQUE IOHEXOL 300 MG/ML  SOLN  COMPARISON:  CT abdomen pelvis -08/30/2010  FINDINGS: Normal hepatic contour. Punctate calcification within the left lobe of the liver (image 25, series 201), likely the sequela of prior granulomatous infection. No discrete hepatic lesions. Normal appearance of the gallbladder. No radiopaque gallstones. No intra extrahepatic biliary duct  dilatation. No ascites.  There is symmetric enhancement and excretion of the bilateral kidneys. 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 No evidence urinary obstruction or perinephric stranding. No renal stones. Normal appearance of the bilateral adrenal glands, pancreas and spleen.  Ingested enteric contrast extends to the level of the mid/distal small bowel. The descending colon is underdistended. Moderate colonic stool burden within the cecum and ascending colon. No evidence of enteric obstruction. The appendix is not visualized compatible with provided surgical history. No pneumoperitoneum, pneumatosis or portal venous gas.  Scattered minimal amount of atherosclerotic plaque within a normal caliber abdominal aorta. The major branch vessels of the abdominal aorta appear patent on this non CTA examination.  No bulky retroperitoneal, mesenteric, pelvic or inguinal lymphadenopathy.  Post hysterectomy. Normal appearance  of the urinary bladder given degree distention. No discrete adnexal lesion. Several phleboliths are seen with the lower pelvis bilaterally. No free fluid within the pelvic cul-de-sac.  Limited visualization of lower thorax demonstrates minimal dependent subpleural ground-glass atelectasis. No discrete focal airspace opacities. No pleural effusion.  Punctate (approximately 4 and 3 mm nodules) within the right middle lobe (representative images 2 and 4, series 205) are unchanged since the 2011 examination and thus of benign etiology.  Normal heart size.  No pericardial effusion.  No acute or aggressive osseous abnormalities. Mild degenerative change of the pubic symphysis.  Regional soft tissues appear normal.  IMPRESSION:  1. No definite explanation for patient's radiating left-sided abdominal pain. 2. 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 No evidence of acute urinary obstruction.   Electronically Signed   By: Sandi Mariscal M.D.   On: 08/15/2015 09:17   US Abdomen Limited Ruq  08/15/2015   CLINICAL DATA:  Elevated liver function tests.  EXAM: US ABDOMEN LIMITED - RIGHT UPPER QUADRANT  COMPARISON:  CT of earlier in the day  FINDINGS: Gallbladder:  Multiple gallstones. Mild Gallbladder distension. No wall thickening or pericholecystic fluid. Patient was pre-medicated, therefore evaluation for sonographic Murphy's sign limited.  Common bile duct:  Diameter: Normal, 4 mm.  Liver:  Increased echogenicity, suggesting mild hepatic steatosis.  Incidental note is made of right-sided caliectasis, as described on CT.  IMPRESSION: 1. Hepatic steatosis. 2. Cholelithiasis with gallbladder distention but no specific evidence of acute cholecystitis. No evidence of inflammation on the CT of earlier today. 3. No biliary duct dilatation.   Electronically Signed   By: Abigail Miyamoto M.D.   On: 08/15/2015 16:33    Review of Systems  HENT: Negative.   Eyes: Negative.   Respiratory: Negative.   Cardiovascular: Negative.   Gastrointestinal: Positive for heartburn and abdominal pain. Negative for nausea, vomiting, diarrhea, constipation, blood in stool and melena.  Genitourinary: Negative.   Musculoskeletal: Positive for joint pain (knees and hands with arthritis sometimes).  Skin: Negative.   Neurological: Negative.   Endo/Heme/Allergies: Negative.   Psychiatric/Behavioral: Negative.    Blood pressure 130/65, pulse 79, temperature 98.3 F (36.8 C), temperature source Oral,  resp. rate 18, height _0  (1.626 m), weight 74.844 kg (165 lb), SpO2 100 %. Physical Exam  Constitutional: She is oriented to person, place, and time. She appears well-developed and well-nourished. No distress.  HENT:  Head: Normocephalic and atraumatic.  Nose: Nose normal.  Eyes: Conjunctivae and EOM are normal. Right eye exhibits no discharge. Left eye exhibits no discharge. No scleral icterus.  Neck: Normal range of motion. Neck supple. No JVD present. No tracheal deviation present. No thyromegaly present.  Cardiovascular: Normal rate, regular rhythm and intact distal pulses.   Murmur (systolic mumur Aortic and Apex) heard. Respiratory: Effort normal and breath sounds normal. No respiratory distress. She has no wheezes. She has no rales. She exhibits no tenderness.  GI: Soft. Bowel sounds are normal. She exhibits no distension and no mass. There is no tenderness. There is no rebound and no guarding.  Pain was left lower abdomen going to the left upper abdomen.  Currently no pain.  Musculoskeletal: She exhibits no edema.  Lymphadenopathy:    She has no cervical adenopathy.  Neurological: She is alert and oriented to  person, place, and time. No cranial nerve deficit.  Skin: Skin is warm and dry. No rash noted. She is not diaphoretic. No erythema. No pallor.  Psychiatric: She has a normal mood and affect. Her behavior is normal. Judgment and thought content normal.   . sodium chloride 100 mL/hr at 08/16/15 0223    Assessment/Plan: Pancreatitis Cholelithiasis Hx of IBS Hx of GERD S/p knee and wrist surgery with some arthritis Hx of depression  Plan:  NPO, ambulate, repeat labs in AM.  Continue IV hydration.  She does not have a good reason for pancreatitis aside from her gallstones.  Will recheck her labs in AM and follow with you.    Tailer Volkert 08/16/2015, 10:44 AM

## 2015-08-16 NOTE — Progress Notes (Signed)
[facility]                                                                              Patient Demographics  Rhonda Norris, is a 64 y.o. female, DOB - Feb 26, 1951, AVW:[id number]  Admit date - 08/15/2015   Admitting Physician No admitting provider for patient encounter.  Outpatient Primary MD for the patient is Mickie Hillier, MD  LOS - 1   Chief Complaint  Patient presents with  . Abdominal Pain      HPI on 08/15/2015 by Dr. Shary Decamp B [name] is a 64 y.o. female with past medical history significant for ulcerative arthritis, IBS/IBD, gastroesophageal reflux disease, depression and seasonal allergic rhinitis; who presented to the emergency department secondary to abdominal pain and nausea. Patient reports that her pain started Thursday night (08/13/15) after her dinner, has continue steadily worsening since then. The pain is localizing her mid abdomen and radiates across toward her back and left upper quadrant/left lower quadrant; she endorses some associated reflux and nausea along with the pain but denies any vomiting. Patient also endorses some worsening of her discomfort with food intake. She denies chest pain, shortness of breath, cough, hematuria, dysuria, melena, hematochezia, headaches, blurred vision, focal weakness or any other acute complaints. In the ED workup has demonstrated lipase above 3000, transaminitis, hyperglycemia with a CBG in the 229 range and also mild signs of dehydration on physical exam with mild hyponatremia/hypochloremia. [facility] has been called to admit the patient for further evaluation and treatment of acute pancreatitis.  Assessment & Plan   Acute pancreatitis -Possibly secondary to gallstones; patient denies any alcohol intake; currently not on diuretics for statins -Upon admission, lipase was over 3000, amylase 1999 -CT abdomen -Abdominal ultrasound: Hepatic steatosis, cholelithiasis with gallbladder distention, and no  cholecystitis -Continue conservative management, bowel rest, pain control, IVF,antiemetics -Will continue to monitor amylase and lipase, CMP  Nausea -Secondary to the above  Hyperglycemia -No history of diabetes -Hemoglobin A1c pending  Irritable bowel syndrome -Continue supportive care, patient reports last bowel movement was 2 nights ago  Transaminitis -Pamelor held -Ultrasound as noted above -Hepatitis panel: Negative  GERD  -Continue PPI  Depression -Pamelor held, continue to monitor  Code Status: Full  Family Communication: Daughter at bedside  Disposition Plan: Admitted. Continue to monitor.  Time Spent in minutes   30 minutes  Procedures  RUQ [place]  Consults   Gen. surgery  DVT Prophylaxis  Lovenox  Lab Results  Component Value Date   PLT 336 08/15/2015    Medications  Scheduled Meds: . enoxaparin (LOVENOX) injection  40 mg Subcutaneous Q24H  . pantoprazole (PROTONIX) IV  40 mg Intravenous Q24H   Continuous Infusions: . sodium chloride 100 mL/hr at 08/16/15 0223   PRN Meds:.morphine injection, ondansetron **OR** ondansetron (ZOFRAN) IV  Antibiotics    Anti-infectives    None      Subjective:   [name] seen and examined today.  Patient continues to complain of some pain. States nausea has improved mildly. Denies chest pain, shortness of breath, diarrhea or constipation at this time.    Objective:   Filed Vitals:   08/15/15 1112 08/15/15 2153 08/16/15 0242 08/16/15  0530  BP: 139/61 123/68 123/64 130/65  Pulse: 74 70 66 79  Temp:  97.7 F (36.5 C) 97.6 F (36.4 C) 98.3 F (36.8 C)  TempSrc:  Oral Axillary Oral  Resp: 18 18 18 18   Height:      Weight:      SpO2: 98% 97% 98% 100%    Wt Readings from Last 3 Encounters:  08/15/15 74.844 kg (165 lb)  12/17/14 74.39 kg (164 lb)  09/16/13 74.39 kg (164 lb)     Intake/Output Summary (Last 24 hours) at 08/16/15 1238 Last data filed at 08/16/15 0650  Gross per 24 hour    Intake 1988.33 ml  Output    750 ml  Net 1238.33 ml    Exam  General: Well developed, well nourished, NAD, appears stated age  HEENT: NCAT, mucous membranes moist.   Cardiovascular: S1 S2 auscultated, RRR, 2/6 SEM  Respiratory: Clear to auscultation bilaterally with equal chest rise  Abdomen: Soft, LUQ/LLQ tenderness, nondistended, + bowel sounds  Extremities: warm dry without cyanosis clubbing or edema  Neuro: AAOx3, nonfocal  Psych: Normal affect and demeanor with intact judgement and insight  Data Review   Micro Results No results found for this or any previous visit (from the past 240 hour(s)).  Radiology Reports Ct Abdomen Pelvis W Contrast  08/15/2015   CLINICAL DATA:  Left-sided abdominal pain radiating to the epigastric area. Symptoms began yesterday.  EXAM: CT ABDOMEN AND PELVIS WITH CONTRAST  TECHNIQUE: Multidetector CT imaging of the abdomen and pelvis was performed using the standard protocol following bolus administration of intravenous contrast.  CONTRAST:  OMNIPAQUE IOHEXOL 300 MG/ML  SOLN  COMPARISON:  CT abdomen pelvis -08/30/2010  FINDINGS: Normal hepatic contour. Punctate calcification within the left lobe of the liver (image 25, series 201), likely the sequela of prior granulomatous infection. No discrete hepatic lesions. Normal appearance of the gallbladder. No radiopaque gallstones. No intra extrahepatic biliary duct dilatation. No ascites.  There is symmetric enhancement and excretion of the bilateral kidneys. 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 No evidence urinary obstruction or perinephric stranding. No renal stones. Normal appearance of the bilateral adrenal glands,  pancreas and spleen.  Ingested enteric contrast extends to the level of the mid/distal small bowel. The descending colon is underdistended. Moderate colonic stool burden within the cecum and ascending colon. No evidence of enteric obstruction. The appendix is not visualized compatible with provided surgical history. No pneumoperitoneum, pneumatosis or portal venous gas.  Scattered minimal amount of atherosclerotic plaque within a normal caliber abdominal aorta. The major branch vessels of the abdominal aorta appear patent on this non CTA examination.  No bulky retroperitoneal, mesenteric, pelvic or inguinal lymphadenopathy.  Post hysterectomy. Normal appearance of the urinary bladder given degree distention. No discrete adnexal lesion. Several phleboliths are seen with the lower pelvis bilaterally. No free fluid within the pelvic cul-de-sac.  Limited visualization of lower thorax demonstrates minimal dependent subpleural ground-glass atelectasis. No discrete focal airspace opacities. No pleural effusion.  Punctate (approximately 4 and 3 mm nodules) within the right middle lobe (representative images 2 and 4, series 205) are unchanged since the 2011 examination and thus of benign etiology.  Normal heart size.  No pericardial effusion.  No acute or aggressive osseous abnormalities. Mild degenerative  change of the pubic symphysis.  Regional soft tissues appear normal.  IMPRESSION: 1. No definite explanation for patient's radiating left-sided abdominal pain. 2. 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 No evidence of acute urinary obstruction.   Electronically Signed   By: Simonne Come M.D.   On: 08/15/2015 09:17   US Abdomen Limited Ruq  08/15/2015    CLINICAL DATA:  Elevated liver function tests.  EXAM: US ABDOMEN LIMITED - RIGHT UPPER QUADRANT  COMPARISON:  CT of earlier in the day  FINDINGS: Gallbladder:  Multiple gallstones. Mild Gallbladder distension. No wall thickening or pericholecystic fluid. Patient was pre-medicated, therefore evaluation for sonographic Murphy's sign limited.  Common bile duct:  Diameter: Normal, 4 mm.  Liver:  Increased echogenicity, suggesting mild hepatic steatosis.  Incidental note is made of right-sided caliectasis, as described on CT.  IMPRESSION: 1. Hepatic steatosis. 2. Cholelithiasis with gallbladder distention but no specific evidence of acute cholecystitis. No evidence of inflammation on the CT of earlier today. 3. No biliary duct dilatation.   Electronically Signed   By: Jeronimo Greaves M.D.   On: 08/15/2015 16:33    CBC  Recent Labs Lab 08/15/15 0315 08/15/15 1200  WBC 7.9 8.9  HGB 12.3 11.2*  HCT 37.5 34.3*  PLT 325 336  MCV 87.8 86.8  MCH 28.8 28.4  MCHC 32.8 32.7  RDW 13.9 13.8    Chemistries   Recent Labs Lab 08/15/15 0315 08/15/15 1200  NA 134*  --   K 4.2  --   CL 98*  --   CO2 29  --   GLUCOSE 229*  --   BUN 9  --   CREATININE 0.68 0.60  CALCIUM 9.0  --   AST 1662*  --   ALT 1208*  --   ALKPHOS 155*  --   BILITOT 1.1  --    ------------------------------------------------------------------------------------------------------------------ estimated creatinine clearance is 70.3 mL/min (by C-G formula based on Cr of 0.6). ------------------------------------------------------------------------------------------------------------------ No results for input(s): HGBA1C in the last 72 hours. ------------------------------------------------------------------------------------------------------------------ No results for input(s): CHOL, HDL, LDLCALC, TRIG, CHOLHDL, LDLDIRECT in the last 72  hours. ------------------------------------------------------------------------------------------------------------------ No results for input(s): TSH, T4TOTAL, T3FREE, THYROIDAB in the last 72 hours.  Invalid input(s): FREET3 ------------------------------------------------------------------------------------------------------------------ No results for input(s): VITAMINB12, FOLATE, FERRITIN, TIBC, IRON, RETICCTPCT in the last 72 hours.  Coagulation profile No results for input(s): INR, PROTIME in the last 168 hours.  No results for input(s): DDIMER in the last 72 hours.  Cardiac Enzymes No results for input(s): CKMB, TROPONINI, MYOGLOBIN in the last 168 hours.  Invalid input(s): CK ------------------------------------------------------------------------------------------------------------------ Invalid input(s): POCBNP    Alixis Harmon D.O. on 08/16/2015 at 12:38 PM  Between 7am to 7pm - Pager - [phone]  After 7pm go to [email or web address] - password [email or web address]  And look for the night coverage person covering for me after hours  [facility] Office  [phone]

## 2015-08-17 ENCOUNTER — Inpatient Hospital Stay (HOSPITAL_COMMUNITY): Payer: BC Managed Care – PPO | Admitting: Anesthesiology

## 2015-08-17 ENCOUNTER — Encounter (HOSPITAL_COMMUNITY)
Admission: EM | Disposition: A | Discharge status: Home / Self Care | Payer: Self-pay | Source: Home / Self Care | Attending: Internal Medicine

## 2015-08-17 ENCOUNTER — Inpatient Hospital Stay (HOSPITAL_COMMUNITY): Payer: BC Managed Care – PPO

## 2015-08-17 ENCOUNTER — Encounter (HOSPITAL_COMMUNITY): Payer: Self-pay | Admitting: Certified Registered Nurse Anesthetist

## 2015-08-17 HISTORY — PX: CHOLECYSTECTOMY: SHX55

## 2015-08-17 LAB — AMYLASE: Amylase: 167 U/L — ABNORMAL HIGH (ref 28–100)

## 2015-08-17 LAB — HEMOGLOBIN A1C
Hgb A1c MFr Bld: 6.6 % — ABNORMAL HIGH (ref 4.8–5.6)
Mean Plasma Glucose: 143 mg/dL

## 2015-08-17 SURGERY — LAPAROSCOPIC CHOLECYSTECTOMY WITH INTRAOPERATIVE CHOLANGIOGRAM
Anesthesia: General | Site: Abdomen

## 2015-08-17 MED ORDER — ONDANSETRON HCL 4 MG/2ML IJ SOLN
INTRAMUSCULAR | Status: DC | PRN
  Administered 2015-08-17: 4 mg via INTRAVENOUS

## 2015-08-17 MED ORDER — LIDOCAINE HCL (CARDIAC) 20 MG/ML IV SOLN
INTRAVENOUS | Status: DC | PRN
  Administered 2015-08-17: 40 mg via INTRAVENOUS

## 2015-08-17 MED ORDER — MORPHINE SULFATE (PF) 2 MG/ML IV SOLN: 1.0000 mg | INTRAVENOUS | Status: DC | PRN

## 2015-08-17 MED ORDER — MIDAZOLAM HCL 2 MG/2ML IJ SOLN: 0.5000 mg | Freq: Once | INTRAMUSCULAR | Status: DC | PRN

## 2015-08-17 MED ORDER — MEPERIDINE HCL 25 MG/ML IJ SOLN: 6.2500 mg | INTRAMUSCULAR | Status: DC | PRN

## 2015-08-17 MED ORDER — 0.9 % SODIUM CHLORIDE (POUR BTL) OPTIME
TOPICAL | Status: DC | PRN
  Administered 2015-08-17: 1000 mL

## 2015-08-17 MED ORDER — FENTANYL CITRATE (PF) 250 MCG/5ML IJ SOLN
INTRAMUSCULAR | Status: AC
  Filled 2015-08-17: qty 5

## 2015-08-17 MED ORDER — FENTANYL CITRATE (PF) 100 MCG/2ML IJ SOLN
INTRAMUSCULAR | Status: DC | PRN
  Administered 2015-08-17 (×2): 50 ug via INTRAVENOUS
  Administered 2015-08-17: 100 ug via INTRAVENOUS

## 2015-08-17 MED ORDER — PROPOFOL 10 MG/ML IV BOLUS
INTRAVENOUS | Status: DC | PRN
  Administered 2015-08-17: 150 mg via INTRAVENOUS
  Administered 2015-08-17: 20 mg via INTRAVENOUS

## 2015-08-17 MED ORDER — SUCCINYLCHOLINE CHLORIDE 20 MG/ML IJ SOLN
INTRAMUSCULAR | Status: DC | PRN
  Administered 2015-08-17: 120 mg via INTRAVENOUS

## 2015-08-17 MED ORDER — PHENYLEPHRINE HCL 10 MG/ML IJ SOLN
INTRAMUSCULAR | Status: DC | PRN
  Administered 2015-08-17 (×2): 80 ug via INTRAVENOUS

## 2015-08-17 MED ORDER — BUPIVACAINE-EPINEPHRINE 0.25% -1:200000 IJ SOLN
INTRAMUSCULAR | Status: DC | PRN
  Administered 2015-08-17: 30 mL

## 2015-08-17 MED ORDER — MIDAZOLAM HCL 2 MG/2ML IJ SOLN
INTRAMUSCULAR | Status: AC
  Filled 2015-08-17: qty 4

## 2015-08-17 MED ORDER — PROMETHAZINE HCL 25 MG/ML IJ SOLN
6.2500 mg | INTRAMUSCULAR | Status: DC | PRN
Start: 2015-08-17 — End: 2015-08-17

## 2015-08-17 MED ORDER — HYDROMORPHONE HCL 1 MG/ML IJ SOLN
0.2500 mg | INTRAMUSCULAR | Status: DC | PRN
  Administered 2015-08-17 (×2): 0.5 mg via INTRAVENOUS

## 2015-08-17 MED ORDER — SODIUM CHLORIDE 0.9 % IR SOLN
Status: DC | PRN
  Administered 2015-08-17: 1000 mL

## 2015-08-17 MED ORDER — LACTATED RINGERS IV SOLN
INTRAVENOUS | Status: DC
  Administered 2015-08-17 (×2): via INTRAVENOUS

## 2015-08-17 MED ORDER — SODIUM CHLORIDE 0.9 % IV SOLN
INTRAVENOUS | Status: DC | PRN
  Administered 2015-08-17: 6 mL

## 2015-08-17 MED ORDER — PROPOFOL 10 MG/ML IV BOLUS
INTRAVENOUS | Status: AC
  Filled 2015-08-17: qty 20

## 2015-08-17 MED ORDER — FLUTICASONE PROPIONATE 50 MCG/ACT NA SUSP
1.0000 | Freq: Every day | NASAL | Status: DC
  Administered 2015-08-17 – 2015-08-18 (×2): 1 via NASAL
  Filled 2015-08-17 (×2): qty 16

## 2015-08-17 MED ORDER — CEFAZOLIN SODIUM-DEXTROSE 2-3 GM-% IV SOLR
2.0000 g | Freq: Three times a day (TID) | INTRAVENOUS | Status: DC
  Administered 2015-08-17 – 2015-08-18 (×3): 2 g via INTRAVENOUS
  Filled 2015-08-17 (×6): qty 50

## 2015-08-17 MED ORDER — BUPIVACAINE-EPINEPHRINE (PF) 0.25% -1:200000 IJ SOLN
INTRAMUSCULAR | Status: AC
  Filled 2015-08-17: qty 30

## 2015-08-17 MED ORDER — HYDROMORPHONE HCL 1 MG/ML IJ SOLN
INTRAMUSCULAR | Status: AC
  Filled 2015-08-17: qty 1

## 2015-08-17 MED ORDER — HYDROCODONE-ACETAMINOPHEN 5-325 MG PO TABS
1.0000 | ORAL_TABLET | ORAL | Status: DC | PRN
  Administered 2015-08-17 – 2015-08-18 (×2): 2 via ORAL
  Filled 2015-08-17 (×2): qty 2

## 2015-08-17 MED ORDER — MIDAZOLAM HCL 5 MG/5ML IJ SOLN
INTRAMUSCULAR | Status: DC | PRN
  Administered 2015-08-17: 2 mg via INTRAVENOUS

## 2015-08-17 SURGICAL SUPPLY — 34 items
APPLIER CLIP 5 13 M/L LIGAMAX5 (MISCELLANEOUS) ×3
BLADE SURG CLIPPER 3M 9600 (MISCELLANEOUS) IMPLANT
CANISTER SUCTION 2500CC (MISCELLANEOUS) ×3 IMPLANT
CHLORAPREP W/TINT 26ML (MISCELLANEOUS) ×3 IMPLANT
CLIP APPLIE 5 13 M/L LIGAMAX5 (MISCELLANEOUS) ×1 IMPLANT
COVER MAYO STAND STRL (DRAPES) ×3 IMPLANT
COVER SURGICAL LIGHT HANDLE (MISCELLANEOUS) ×3 IMPLANT
DRAPE C-ARM 42X72 X-RAY (DRAPES) ×3 IMPLANT
ELECT REM PT RETURN 9FT ADLT (ELECTROSURGICAL) ×3
ELECTRODE REM PT RTRN 9FT ADLT (ELECTROSURGICAL) ×1 IMPLANT
GLOVE BIO SURGEON STRL SZ7.5 (GLOVE) ×3 IMPLANT
GLOVE SURG SIGNA 7.5 PF LTX (GLOVE) ×3 IMPLANT
GOWN STRL REUS W/ TWL LRG LVL3 (GOWN DISPOSABLE) ×2 IMPLANT
GOWN STRL REUS W/ TWL XL LVL3 (GOWN DISPOSABLE) ×1 IMPLANT
GOWN STRL REUS W/TWL LRG LVL3 (GOWN DISPOSABLE) ×4
GOWN STRL REUS W/TWL XL LVL3 (GOWN DISPOSABLE) ×2
KIT BASIN OR (CUSTOM PROCEDURE TRAY) ×3 IMPLANT
KIT ROOM TURNOVER OR (KITS) ×3 IMPLANT
LIQUID BAND (GAUZE/BANDAGES/DRESSINGS) ×3 IMPLANT
NS IRRIG 1000ML POUR BTL (IV SOLUTION) ×3 IMPLANT
PAD ARMBOARD 7.5X6 YLW CONV (MISCELLANEOUS) ×3 IMPLANT
POUCH SPECIMEN RETRIEVAL 10MM (ENDOMECHANICALS) ×3 IMPLANT
SCISSORS LAP 5X35 DISP (ENDOMECHANICALS) ×3 IMPLANT
SET CHOLANGIOGRAPH 5 50 .035 (SET/KITS/TRAYS/PACK) ×3 IMPLANT
SET IRRIG TUBING LAPAROSCOPIC (IRRIGATION / IRRIGATOR) ×3 IMPLANT
SLEEVE ENDOPATH XCEL 5M (ENDOMECHANICALS) ×6 IMPLANT
SPECIMEN JAR SMALL (MISCELLANEOUS) ×3 IMPLANT
SUT MON AB 4-0 PC3 18 (SUTURE) ×3 IMPLANT
TOWEL OR 17X24 6PK STRL BLUE (TOWEL DISPOSABLE) ×3 IMPLANT
TOWEL OR 17X26 10 PK STRL BLUE (TOWEL DISPOSABLE) ×3 IMPLANT
TRAY LAPAROSCOPIC MC (CUSTOM PROCEDURE TRAY) ×3 IMPLANT
TROCAR XCEL BLUNT TIP 100MML (ENDOMECHANICALS) ×3 IMPLANT
TROCAR XCEL NON-BLD 5MMX100MML (ENDOMECHANICALS) ×3 IMPLANT
TUBING INSUFFLATION (TUBING) ×3 IMPLANT

## 2015-08-17 NOTE — Progress Notes (Signed)
[facility]                                                                              Patient Demographics  Rhonda Norris, is a 64 y.o. female, DOB - [birth year], ZOX:[id number]  Admit date - 08/15/2015   Admitting Physician Abigail Miyamoto, MD  Outpatient Primary MD for the patient is Mickie Hillier, MD  LOS - 2   Chief Complaint  Patient presents with  . Abdominal Pain      HPI on 08/15/2015 by Dr. Shary Decamp B Kassel is a 63 y.o. female with past medical history significant for ulcerative arthritis, IBS/IBD, gastroesophageal reflux disease, depression and seasonal allergic rhinitis; who presented to the emergency department secondary to abdominal pain and nausea. Patient reports that her pain started Thursday night (08/13/15) after her dinner, has continue steadily worsening since then. The pain is localizing her mid abdomen and radiates across toward her back and left upper quadrant/left lower quadrant; she endorses some associated reflux and nausea along with the pain but denies any vomiting. Patient also endorses some worsening of her discomfort with food intake. She denies chest pain, shortness of breath, cough, hematuria, dysuria, melena, hematochezia, headaches, blurred vision, focal weakness or any other acute complaints. In the ED workup has demonstrated lipase above 3000, transaminitis, hyperglycemia with a CBG in the 229 range and also mild signs of dehydration on physical exam with mild hyponatremia/hypochloremia. [facility] has been called to admit the patient for further evaluation and treatment of acute pancreatitis.  Assessment & Plan   Acute pancreatitis -Possibly secondary to gallstones; patient denies any alcohol intake; currently not on diuretics for statins -Upon admission, lipase was over 3000, amylase 1999 -CT abdomen: No explanation for patient's left-sided abdominal pain -Abdominal ultrasound: Hepatic steatosis, cholelithiasis with  gallbladder distention, and no cholecystitis -Continue conservative management, bowel rest, pain control, IVF,antiemetics -Amylase, lipase, LFTs all trending downward -Gen. surgery consulted and appreciated -Plan for cholecystectomy today.  Nausea -Resolved, Secondary to the above  Hyperglycemia -No history of diabetes -Hemoglobin A1c 6.6  Irritable bowel syndrome -Continue supportive care  Transaminitis -Pamelor held -Ultrasound as noted above -Hepatitis panel: Negative -Improving  GERD  -Continue PPI  Depression -Pamelor held, continue to monitor  Normocytic Anemia -Hemoglobin 9.8. Drop likely secondary to dilutional effect from IV fluids -Continue to monitor CBC  Code Status: Full  Family Communication: Daughter at bedside  Disposition Plan: Admitted. Continue to monitor. Cholecystectomy planned for today.  Time Spent in minutes   30 minutes  Procedures  RUQ [place]  Consults   Gen. surgery  DVT Prophylaxis  Lovenox  Lab Results  Component Value Date   PLT 297 08/16/2015    Medications  Scheduled Meds: . [MAR Hold]  ceFAZolin (ANCEF) IV  2 g Intravenous 3 times per day  . [MAR Hold] enoxaparin (LOVENOX) injection  40 mg Subcutaneous Q24H  . [MAR Hold] fluticasone  1 spray Each Nare Daily  . [MAR Hold] pantoprazole (PROTONIX) IV  40 mg Intravenous Q24H   Continuous Infusions: . sodium chloride 100 mL/hr at 08/16/15 2226  . lactated ringers 10 mL/hr at 08/17/15 1052   PRN Meds:.[MAR Hold]  morphine injection, [MAR Hold]  ondansetron **OR** [MAR Hold] ondansetron (ZOFRAN) IV  Antibiotics    Anti-infectives    Start     Dose/Rate Route Frequency Ordered Stop   08/17/15 1400  [MAR Hold]  ceFAZolin (ANCEF) IVPB 2 g/50 mL premix     (MAR Hold since 08/17/15 1038)   2 g 100 mL/hr over 30 Minutes Intravenous 3 times per day 08/17/15 0957        Subjective:   Rhonda Norris seen and examined today.  Patient complains of mild headache this morning.  Denies any abdominal pain, nausea at this time. Does states she is hungry. Denies chest pain, shortness of breath, diarrhea or constipation at this time.    Objective:   Filed Vitals:   08/16/15 0530 08/16/15 1302 08/16/15 2237 08/17/15 0618  BP: 130/65 132/68 146/66 151/72  Pulse: 79 75 77 72  Temp: 98.3 F (36.8 C) 98.6 F (37 C) 98.6 F (37 C) 98.4 F (36.9 C)  TempSrc: Oral Oral Oral Oral  Resp: Height:      Weight:      SpO2: 100% 99% 100% 98%    Wt Readings from Last 3 Encounters:  08/15/15 74.844 kg (165 lb)  12/17/14 74.39 kg (164 lb)  09/16/13 74.39 kg (164 lb)     Intake/Output Summary (Last 24 hours) at 08/17/15 1107 Last data filed at 08/17/15 0600  Gross per 24 hour  Intake   2399 ml  Output      0 ml  Net   2399 ml    Exam  General: Well developed, well nourished, NAD  HEENT: NCAT, mucous membranes moist.   Cardiovascular: S1 S2 auscultated, RRR, 2/6 SEM  Respiratory: Clear to auscultation   Abdomen: Soft, nontender, nondistended, + bowel sounds  Extremities: warm dry without cyanosis clubbing or edema  Neuro: AAOx3, nonfocal  Psych: Normal affect and demeanor, appropriate  Data Review   Micro Results No results found for this or any previous visit (from the past 240 hour(s)).  Radiology Reports Ct Abdomen Pelvis W Contrast  08/15/2015   CLINICAL DATA:  Left-sided abdominal pain radiating to the epigastric area. Symptoms began yesterday.  EXAM: CT ABDOMEN AND PELVIS WITH CONTRAST  TECHNIQUE: Multidetector CT imaging of the abdomen and pelvis was performed using the standard protocol following bolus administration of intravenous contrast.  CONTRAST:  OMNIPAQUE IOHEXOL 300 MG/ML  SOLN  COMPARISON:  CT abdomen pelvis -08/30/2010  FINDINGS: Normal hepatic contour. Punctate calcification within the left lobe of the liver (image 25, series 201), likely the sequela of prior granulomatous infection. No discrete hepatic lesions.  Normal appearance of the gallbladder. No radiopaque gallstones. No intra extrahepatic biliary duct dilatation. No ascites.  There is symmetric enhancement and excretion of the bilateral kidneys. 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 No evidence urinary obstruction or perinephric stranding. No renal stones. Normal appearance of the bilateral adrenal glands, pancreas and spleen.  Ingested enteric contrast extends to the level of the mid/distal small bowel. The descending colon is underdistended. Moderate colonic stool burden within the cecum and ascending colon. No evidence of enteric obstruction. The appendix is not visualized compatible with provided surgical history. No pneumoperitoneum, pneumatosis or portal venous gas.  Scattered minimal amount of atherosclerotic plaque within a  normal caliber abdominal aorta. The major branch vessels of the abdominal aorta appear patent on this non CTA examination.  No bulky retroperitoneal, mesenteric, pelvic or inguinal lymphadenopathy.  Post hysterectomy. Normal appearance of the urinary bladder given degree distention. No discrete adnexal lesion. Several phleboliths are seen with the lower pelvis bilaterally. No free fluid within the pelvic cul-de-sac.  Limited visualization of lower thorax demonstrates minimal dependent subpleural ground-glass atelectasis. No discrete focal airspace opacities. No pleural effusion.  Punctate (approximately 4 and 3 mm nodules) within the right middle lobe (representative images 2 and 4, series 205) are unchanged since the 2011 examination and thus of benign etiology.  Normal heart size.  No pericardial effusion.  No acute or aggressive osseous abnormalities.  Mild degenerative change of the pubic symphysis.  Regional soft tissues appear normal.  IMPRESSION: 1. No definite explanation for patient's radiating left-sided abdominal pain. 2. 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 No evidence of acute urinary obstruction.   Electronically Signed   By: Simonne Come M.D.   On: 08/15/2015 09:17   US Abdomen Limited Ruq  08/15/2015   CLINICAL DATA:  Elevated liver function tests.  EXAM: US ABDOMEN LIMITED - RIGHT UPPER QUADRANT  COMPARISON:  CT of earlier in the day  FINDINGS: Gallbladder:  Multiple gallstones. Mild Gallbladder distension. No wall thickening or pericholecystic fluid. Patient was pre-medicated, therefore evaluation for sonographic Murphy's sign limited.  Common bile duct:  Diameter: Normal, 4 mm.  Liver:  Increased echogenicity, suggesting mild hepatic steatosis.  Incidental note is made of right-sided caliectasis, as described on CT.  IMPRESSION: 1. Hepatic steatosis. 2. Cholelithiasis with gallbladder distention but no specific evidence of acute cholecystitis. No evidence of inflammation on the CT of earlier today. 3. No biliary duct dilatation.   Electronically Signed   By: Jeronimo Greaves M.D.   On: 08/15/2015 16:33    CBC  Recent Labs Lab 08/15/15 0315 08/15/15 1200 08/16/15 1427  WBC 7.9 8.9 8.4  HGB 12.3 11.2* 9.8*  HCT 37.5 34.3* 31.5*  PLT 325 336 297  MCV 87.8 86.8 88.5  MCH 28.8 28.4 27.5  MCHC 32.8 32.7 31.1  RDW 13.9 13.8 14.6    Chemistries   Recent Labs Lab 08/15/15 0315 08/15/15 1200 08/16/15 1427  NA 134*  --  139  K 4.2  --  4.0  CL 98*  --  107  CO2 29  --  22  GLUCOSE 229*  --  61*  BUN 9  --  6  CREATININE 0.68 0.60 0.59  CALCIUM 9.0  --  8.6*    AST 1662*  --  322*  ALT 1208*  --  630*  ALKPHOS 155*  --  123  BILITOT 1.1  --  0.7   ------------------------------------------------------------------------------------------------------------------ estimated creatinine clearance is 70.3 mL/min (by C-G formula based on Cr of 0.59). ------------------------------------------------------------------------------------------------------------------  Recent Labs  08/15/15 1200  HGBA1C 6.6*   ------------------------------------------------------------------------------------------------------------------ No results for input(s): CHOL, HDL, LDLCALC, TRIG, CHOLHDL, LDLDIRECT in the last 72 hours. ------------------------------------------------------------------------------------------------------------------ No results for input(s): TSH, T4TOTAL, T3FREE, THYROIDAB in the last 72 hours.  Invalid input(s): FREET3 ------------------------------------------------------------------------------------------------------------------ No results for input(s): VITAMINB12, FOLATE, FERRITIN, TIBC, IRON, RETICCTPCT in the last 72 hours.  Coagulation profile No results for input(s): INR, PROTIME in the last 168 hours.  No results for input(s): DDIMER in the last 72 hours.  Cardiac Enzymes No results for input(s): CKMB, TROPONINI, MYOGLOBIN in the last 168 hours.  Invalid input(s): CK ------------------------------------------------------------------------------------------------------------------ Invalid input(s): POCBNP    Jiaire Rosebrook D.O. on 08/17/2015 at 11:07 AM  Between 7am to 7pm - Pager - [phone]  After 7pm go to [email or web address] - password [email or web address]  And look for the night coverage person covering for me after hours  [facility] Office  [phone]

## 2015-08-17 NOTE — Progress Notes (Signed)
[facility] Progress Note     Subjective: Pt's pain resolved.  No N/V.  Ambulating well.  Very thirsty/hungry.  If she cant go for surgery today would like to eat.  Objective: Vital signs in last 24 hours: Temp:  [98.4 F (36.9 C)-98.6 F (37 C)] 98.4 F (36.9 C) (08/29 0618) Pulse Rate:  [72-77] 72 (08/29 0618) Resp:  [18] 18 (08/29 0618) BP: (132-151)/(66-72) 151/72 mmHg (08/29 0618) SpO2:  [98 %-100 %] 98 % (08/29 0618) Last BM Date: 08/15/15  Intake/Output from previous day: 08/28 0701 - 08/29 0700 In: 2399 [I.V.:2399] Out: -  Intake/Output this shift:    PE: Gen:  Alert, NAD, pleasant Abd: Soft, NT/ND, +BS, no HSM, no abdominal scars noted   Lab Results:   Recent Labs  08/15/15 1200 08/16/15 1427  WBC 8.9 8.4  HGB 11.2* 9.8*  HCT 34.3* 31.5*  PLT 336 297   BMET  Recent Labs  08/15/15 0315 08/15/15 1200 08/16/15 1427  NA 134*  --  139  K 4.2  --  4.0  CL 98*  --  107  CO2 29  --  22  GLUCOSE 229*  --  61*  BUN 9  --  6  CREATININE 0.68 0.60 0.59  CALCIUM 9.0  --  8.6*   PT/INR No results for input(s): LABPROT, INR in the last 72 hours. CMP     Component Value Date/Time   NA 139 08/16/2015 1427   K 4.0 08/16/2015 1427   CL 107 08/16/2015 1427   CO2 22 08/16/2015 1427   GLUCOSE 61* 08/16/2015 1427   BUN 6 08/16/2015 1427   CREATININE 0.59 08/16/2015 1427   CALCIUM 8.6* 08/16/2015 1427   PROT 6.7 08/16/2015 1427   ALBUMIN 3.5 08/16/2015 1427   AST 322* 08/16/2015 1427   ALT 630* 08/16/2015 1427   ALKPHOS 123 08/16/2015 1427   BILITOT 0.7 08/16/2015 1427   GFRNONAA >60 08/16/2015 1427   GFRAA >60 08/16/2015 1427   Lipase     Component Value Date/Time   LIPASE 58* 08/16/2015 1427       Studies/Results: US Abdomen Limited Ruq  08/15/2015   CLINICAL DATA:  Elevated liver function tests.  EXAM: US ABDOMEN LIMITED - RIGHT UPPER QUADRANT  COMPARISON:  CT of earlier in the day  FINDINGS: Gallbladder:  Multiple  gallstones. Mild Gallbladder distension. No wall thickening or pericholecystic fluid. Patient was pre-medicated, therefore evaluation for sonographic Murphy's sign limited.  Common bile duct:  Diameter: Normal, 4 mm.  Liver:  Increased echogenicity, suggesting mild hepatic steatosis.  Incidental note is made of right-sided caliectasis, as described on CT.  IMPRESSION: 1. Hepatic steatosis. 2. Cholelithiasis with gallbladder distention but no specific evidence of acute cholecystitis. No evidence of inflammation on the CT of earlier today. 3. No biliary duct dilatation.   Electronically Signed   By: Jeronimo Greaves M.D.   On: 08/15/2015 16:33    Anti-infectives: Anti-infectives    None       Assessment/Plan GS Pancreatitis -LFT's improving, Bili normal, lipase normal -Plan for OR today or tomorrow, will check with Dr. Magnus Ivan about timing -NPO, IVF, pain control, antiemetics -Ambulate and IS -SCD's and hold blood thinners for possible surgery today Hx of IBS Hx of GERD S/p knee and wrist surgery with some arthritis Hx of depression      LOS: 2 days    Nonie Hoyer 08/17/2015, 9:04 AM Pager: [phone]

## 2015-08-17 NOTE — Anesthesia Preprocedure Evaluation (Addendum)
Anesthesia Evaluation  Patient identified by MRN, date of birth, ID band Patient awake    Reviewed: Allergy & Precautions, NPO status , Patient's Chart, lab work & pertinent test results  History of Anesthesia Complications Negative for: history of anesthetic complications  Airway Mallampati: I  TM Distance: >3 FB Neck ROM: Full    Dental  (+) Dental Advisory Given   Pulmonary neg pulmonary ROS,  breath sounds clear to auscultation        Cardiovascular + angina negative cardio ROS  Rhythm:Regular Rate:Normal     Neuro/Psych negative neurological ROS     GI/Hepatic Neg liver ROS, GERD-  Medicated and Controlled,Acute pancreatitis   Endo/Other  negative endocrine ROS  Renal/GU negative Renal ROS     Musculoskeletal   Abdominal   Peds  Hematology  (+) Blood dyscrasia (Hb 9.8), ,   Anesthesia Other Findings   Reproductive/Obstetrics                            Anesthesia Physical Anesthesia Plan  ASA: II  Anesthesia Plan: General   Post-op Pain Management:    Induction: Intravenous  Airway Management Planned: Oral ETT  Additional Equipment:   Intra-op Plan:   Post-operative Plan: Extubation in OR  Informed Consent: I have reviewed the patients History and Physical, chart, labs and discussed the procedure including the risks, benefits and alternatives for the proposed anesthesia with the patient or authorized representative who has indicated his/her understanding and acceptance.   Dental advisory given  Plan Discussed with: CRNA and Surgeon  Anesthesia Plan Comments: (Plan routine monitors, GETA)        Anesthesia Quick Evaluation

## 2015-08-17 NOTE — Transfer of Care (Signed)
Immediate Anesthesia Transfer of Care Note  Patient: Rhonda Norris  Procedure(s) Performed: Procedure(s): LAPAROSCOPIC CHOLECYSTECTOMY WITH INTRAOPERATIVE CHOLANGIOGRAM (N/A)  Patient Location: PACU  Anesthesia Type:General  Level of Consciousness: awake, alert , oriented and patient cooperative  Airway & Oxygen Therapy: Patient Spontanous Breathing and Patient connected to nasal cannula oxygen  Post-op Assessment: Report given to RN and Post -op Vital signs reviewed and stable  Post vital signs: Reviewed and stable  Last Vitals:  Filed Vitals:   08/17/15 1252  BP: 131/53  Pulse: 72  Temp: 36.7 C  Resp:     Complications: No apparent anesthesia complications

## 2015-08-17 NOTE — Op Note (Signed)
Laparoscopic Cholecystectomy with IOC Procedure Note  Indications: This patient presents with symptomatic gallbladder disease and will undergo laparoscopic cholecystectomy.  Pre-operative Diagnosis: gallstone pancreatitis  Post-operative Diagnosis: Same  Surgeon: Abigail Miyamoto A   Assistants: 0  Anesthesia: General endotracheal anesthesia  ASA Class: 2  Procedure Details  The patient was seen again in the Holding Room. The risks, benefits, complications, treatment options, and expected outcomes were discussed with the patient. The possibilities of reaction to medication, pulmonary aspiration, perforation of viscus, bleeding, recurrent infection, finding a normal gallbladder, the need for additional procedures, failure to diagnose a condition, the possible need to convert to an open procedure, and creating a complication requiring transfusion or operation were discussed with the patient. The likelihood of improving the patient's symptoms with return to their baseline status is good.  The patient and/or family concurred with the proposed plan, giving informed consent. The site of surgery properly noted. The patient was taken to Operating Room, identified as Rhonda Norris and the procedure verified as Laparoscopic Cholecystectomy with Intraoperative Cholangiogram. A Time Out was held and the above information confirmed.  Prior to the induction of general anesthesia, antibiotic prophylaxis was administered. General endotracheal anesthesia was then administered and tolerated well. After the induction, the abdomen was prepped with Chloraprep and draped in the sterile fashion. The patient was positioned in the supine position.  Local anesthetic agent was injected into the skin near the umbilicus and an incision made. We dissected down to the abdominal fascia with blunt dissection.  The fascia was incised vertically and we entered the peritoneal cavity bluntly.  A pursestring suture of 0-Vicryl  was placed around the fascial opening.  The Hasson cannula was inserted and secured with the stay suture.  Pneumoperitoneum was then created with CO2 and tolerated well without any adverse changes in the patient's vital signs. An 11-mm port was placed in the subxiphoid position.  Two 5-mm ports were placed in the right upper quadrant. All skin incisions were infiltrated with a local anesthetic agent before making the incision and placing the trocars.   We positioned the patient in reverse Trendelenburg, tilted slightly to the patient's left.  The gallbladder was identified, the fundus grasped and retracted cephalad. Adhesions were lysed bluntly and with the electrocautery where indicated, taking care not to injure any adjacent organs or viscus. The infundibulum was grasped and retracted laterally, exposing the peritoneum overlying the triangle of Calot. This was then divided and exposed in a blunt fashion. A critical view of the cystic duct and cystic artery was obtained.  The cystic duct was clearly identified and bluntly dissected circumferentially. The cystic duct was ligated with a clip distally.   An incision was made in the cystic duct and the [facility] cholangiogram catheter introduced. The catheter was secured using a clip. A cholangiogram was then obtained which showed good visualization of the distal and proximal biliary tree with no sign of filling defects or obstruction.  Contrast flowed easily into the duodenum. The catheter was then removed.   The cystic duct was then ligated with clips and divided. The cystic artery was identified, dissected free, ligated with clips and divided as well.   The gallbladder was dissected from the liver bed in retrograde fashion with the electrocautery. The gallbladder was removed and placed in an Endocatch sac. The liver bed was irrigated and inspected. Hemostasis was achieved with the electrocautery. Copious irrigation was utilized and was repeatedly aspirated until  clear.  The gallbladder and Endocatch sac were then  removed through the umbilical port site.  The pursestring suture was used to close the umbilical fascia.    We again inspected the right upper quadrant for hemostasis.  Pneumoperitoneum was released as we removed the trocars.  4-0 Monocryl was used to close the skin.   Benzoin, steri-strips, and clean dressings were applied. The patient was then extubated and brought to the recovery room in stable condition. Instrument, sponge, and needle counts were correct at closure and at the conclusion of the case.   Findings: Chronic Cholecystitis with Cholelithiasis  Estimated Blood Loss: Minimal         Drains: 0         Specimens: Gallbladder           Complications: None; patient tolerated the procedure well.         Disposition: PACU - hemodynamically stable.         Condition: stable

## 2015-08-17 NOTE — Anesthesia Procedure Notes (Signed)
Procedure Name: Intubation Date/Time: 08/17/2015 12:01 PM Performed by: Dairl Ponder Pre-anesthesia Checklist: Patient identified, Timeout performed, Emergency Drugs available, Suction available and Patient being monitored Patient Re-evaluated:Patient Re-evaluated prior to inductionOxygen Delivery Method: Circle system utilized Preoxygenation: Pre-oxygenation with 100% oxygen Intubation Type: IV induction Ventilation: Mask ventilation without difficulty Laryngoscope Size: Mac and 3 Tube type: Oral Tube size: 7.5 mm Number of attempts: 1 Airway Equipment and Method: Stylet Placement Confirmation: ETT inserted through vocal cords under direct vision,  breath sounds checked- equal and bilateral and positive ETCO2 Secured at: 22 cm Tube secured with: Tape Dental Injury: Teeth and Oropharynx as per pre-operative assessment

## 2015-08-17 NOTE — Progress Notes (Signed)
Inpatient Diabetes Program Recommendations  AACE/ADA: New Consensus Statement on Inpatient Glycemic Control (2013)  Target Ranges:  Prepandial:   less than 140 mg/dL      Peak postprandial:   less than 180 mg/dL (1-2 hours)      Critically ill patients:  140 - 180 mg/dL    Results for VALERY, AMEDEE (MRN [id number]) as of 08/17/2015 10:46  Ref. Range 08/15/2015 03:15 08/16/2015 14:27  Glucose Latest Ref Range: 65-99 mg/dL 119 (H) 61 (L)    Results for JONELLA, REDDITT (MRN [id number]) as of 08/17/2015 10:46  Ref. Range 08/15/2015 12:00  Hemoglobin A1C Latest Ref Range: 4.8-5.6 % 6.6 (H)     Admit with: Acute Pancreatitis possibly due to gallstones   -Glucose level elevated upon admission: 229 mg/dl.  -Note Z3Y was drawn and resulted as 6.6%.      MD- Question accuracy of A1c given patient admitted with acute pancreatitis.  Is this a new diagnosis?      Will follow Ambrose Finland RN, MSN, CDE Diabetes Coordinator Inpatient Glycemic Control Team Team Pager: [phone] (8a-5p)

## 2015-08-18 ENCOUNTER — Encounter (HOSPITAL_COMMUNITY): Payer: Self-pay | Admitting: Surgery

## 2015-08-18 LAB — COMPREHENSIVE METABOLIC PANEL
ALBUMIN: 3.5 g/dL (ref 3.5–5.0)
ALK PHOS: 117 U/L (ref 38–126)
ALT: 372 U/L — ABNORMAL HIGH (ref 14–54)
ANION GAP: 7 (ref 5–15)
AST: 141 U/L — ABNORMAL HIGH (ref 15–41)
BILIRUBIN TOTAL: 0.6 mg/dL (ref 0.3–1.2)
CALCIUM: 8.6 mg/dL — AB (ref 8.9–10.3)
CO2: 27 mmol/L (ref 22–32)
Chloride: 100 mmol/L — ABNORMAL LOW (ref 101–111)
Creatinine, Ser: 0.59 mg/dL (ref 0.44–1.00)
GFR calc Af Amer: >60 mL/min (ref >60)
GFR calc non Af Amer: >60 mL/min (ref >60)
GLUCOSE: 122 mg/dL — AB (ref 65–99)
Potassium: 3.7 mmol/L (ref 3.5–5.1)
Sodium: 134 mmol/L — ABNORMAL LOW (ref 135–145)
TOTAL PROTEIN: 7 g/dL (ref 6.5–8.1)

## 2015-08-18 LAB — CBC
HEMATOCRIT: 34.9 % — AB (ref 36.0–46.0)
HEMOGLOBIN: 11.2 g/dL — AB (ref 12.0–15.0)
MCH: 28.2 pg (ref 26.0–34.0)
MCHC: 32.1 g/dL (ref 30.0–36.0)
MCV: 87.9 fL (ref 78.0–100.0)
Platelets: 255 10*3/uL (ref 150–400)
RBC: 3.97 MIL/uL (ref 3.87–5.11)
RDW: 14.1 % (ref 11.5–15.5)
WBC: 8.5 10*3/uL (ref 4.0–10.5)

## 2015-08-18 LAB — LIPASE, BLOOD: Lipase: 23 U/L (ref 22–51)

## 2015-08-18 LAB — AMYLASE: Amylase: 82 U/L (ref 28–100)

## 2015-08-18 MED ORDER — HYDROCODONE-ACETAMINOPHEN 5-325 MG PO TABS: 1.0000 | ORAL_TABLET | ORAL | Status: DC | PRN

## 2015-08-18 MED ORDER — PANTOPRAZOLE SODIUM 40 MG PO TBEC
40.0000 mg | DELAYED_RELEASE_TABLET | Freq: Every day | ORAL | Status: DC
  Administered 2015-08-18: 40 mg via ORAL
  Filled 2015-08-18: qty 1

## 2015-08-18 NOTE — Anesthesia Postprocedure Evaluation (Signed)
  Anesthesia Post-op Note  Patient: Rhonda Norris  Procedure(s) Performed: Procedure(s): LAPAROSCOPIC CHOLECYSTECTOMY WITH INTRAOPERATIVE CHOLANGIOGRAM (N/A)  Patient Location: PACU  Anesthesia Type:General  Level of Consciousness: awake, alert , oriented and patient cooperative  Airway and Oxygen Therapy: Patient Spontanous Breathing  Post-op Pain: mild  Post-op Assessment: Post-op Vital signs reviewed, Patient's Cardiovascular Status Stable, Respiratory Function Stable, Patent Airway, No signs of Nausea or vomiting and Pain level controlled              Post-op Vital Signs: Reviewed and stable  Last Vitals:  Filed Vitals:   08/18/15 0520  BP: 161/74  Pulse: 83  Temp: 37.2 C  Resp: 15    Complications: No apparent anesthesia complications

## 2015-08-18 NOTE — Progress Notes (Signed)
[facility] Progress Note  1 Day Post-Op  Subjective: Pt feels great.  Happy she got to eat solid food.  Wants to go home.  Daughter at bedside.  No complaints.  Pain well controlled, no N/V.  Ambulating well.  Urinating well and having flatus.  No BM yet.  Objective: Vital signs in last 24 hours: Temp:  [98 F (36.7 C)-98.9 F (37.2 C)] 98.9 F (37.2 C) (08/30 0520) Pulse Rate:  [72-83] 83 (08/30 0520) Resp:  [13-25] 15 (08/30 0520) BP: (131-161)/(53-74) 161/74 mmHg (08/30 0520) SpO2:  [100 %] 100 % (08/30 0520) Last BM Date: 08/15/15  Intake/Output from previous day: 08/29 0701 - 08/30 0700 In: 3106 [P.O.:1270; I.V.:1736; IV Piggyback:100] Out: 50 [Urine:10; Blood:40] Intake/Output this shift:    PE: Gen:  Alert, NAD, pleasant Abd: Soft, mild tenderness, ND, +BS, no HSM, incisions C/D/I   Lab Results:   Recent Labs  08/16/15 1427 08/18/15 0333  WBC 8.4 8.5  HGB 9.8* 11.2*  HCT 31.5* 34.9*  PLT 297 255   BMET  Recent Labs  08/16/15 1427 08/18/15 0333  NA 139 134*  K 4.0 3.7  CL 107 100*  CO2 22 27  GLUCOSE 61* 122*  BUN 6 <5*  CREATININE 0.59 0.59  CALCIUM 8.6* 8.6*   PT/INR No results for input(s): LABPROT, INR in the last 72 hours. CMP     Component Value Date/Time   NA 134* 08/18/2015 0333   K 3.7 08/18/2015 0333   CL 100* 08/18/2015 0333   CO2 27 08/18/2015 0333   GLUCOSE 122* 08/18/2015 0333   BUN <5* 08/18/2015 0333   CREATININE 0.59 08/18/2015 0333   CALCIUM 8.6* 08/18/2015 0333   PROT 7.0 08/18/2015 0333   ALBUMIN 3.5 08/18/2015 0333   AST 141* 08/18/2015 0333   ALT 372* 08/18/2015 0333   ALKPHOS 117 08/18/2015 0333   BILITOT 0.6 08/18/2015 0333   GFRNONAA >60 08/18/2015 0333   GFRAA >60 08/18/2015 0333   Lipase     Component Value Date/Time   LIPASE 23 08/18/2015 0333       Studies/Results: Dg Cholangiogram Operative  08/17/2015   CLINICAL DATA:  Gallstone  EXAM: INTRAOPERATIVE CHOLANGIOGRAM  TECHNIQUE:  Cholangiographic images from the C-arm fluoroscopic device were submitted for interpretation post-operatively. Please see the procedural report for the amount of contrast and the fluoroscopy time utilized.  COMPARISON:  None.  FINDINGS: Contrast fills the biliary tree and duodenum. There are no filling defects in the common bile duct.  IMPRESSION: Patent biliary tree.   Electronically Signed   By: Jolaine Click M.D.   On: 08/17/2015 12:37    Anti-infectives: Anti-infectives    Start     Dose/Rate Route Frequency Ordered Stop   08/17/15 1400  ceFAZolin (ANCEF) IVPB 2 g/50 mL premix     2 g 100 mL/hr over 30 Minutes Intravenous 3 times per day 08/17/15 0957         Assessment/Plan Gallstone pancreatitis POD #1 s/p lap chole -Tolerating solid diet -Ambulate and IS -No further antibiotics needed -Patient okay to d/c home from our perspective, post-op check arranged 09/07/15 at 2:00pm     LOS: 3 days    Nonie Hoyer 08/18/2015, 8:10 AM Pager: [phone]

## 2015-08-18 NOTE — Discharge Summary (Signed)
Physician Discharge Summary  Rhonda Norris ZOX:[id number] DOB: 09-Mar-1951 DOA: 08/15/2015  PCP: Mickie Hillier, MD  Admit date: 08/15/2015 Discharge date: 08/18/2015  Time spent: 45 minutes  Recommendations for Outpatient Follow-up:  Patient will be discharged to home.  Patient will need to follow up with primary care provider within one week of discharge, repeat CMP and lipase at that time.  Follow up with [facility] at specified time.  Patient should continue medications as prescribed.  Patient should follow a low fat diet.  Discharge Diagnoses:  Acute pancreatitis secondary to gallstones  nausea Hyperglycemia Irritable bowel syndrome Transaminitis GERD Depression Normocytic anemia  Discharge Condition: Stable  Diet recommendation: Low fat/soft  Filed Weights   08/15/15 0309  Weight: 74.844 kg (165 lb)    History of present illness:  on 08/15/2015 by Dr. Shary Decamp Rhonda Norris is a 64 y.o. female with past medical history significant for ulcerative arthritis, IBS/IBD, gastroesophageal reflux disease, depression and seasonal allergic rhinitis; who presented to the emergency department secondary to abdominal pain and nausea. Patient reports that her pain started Thursday night (08/13/15) after her dinner, has continue steadily worsening since then. The pain is localizing her mid abdomen and radiates across toward her back and left upper quadrant/left lower quadrant; she endorses some associated reflux and nausea along with the pain but denies any vomiting. Patient also endorses some worsening of her discomfort with food intake. She denies chest pain, shortness of breath, cough, hematuria, dysuria, melena, hematochezia, headaches, blurred vision, focal weakness or any other acute complaints. In the ED workup has demonstrated lipase above 3000, transaminitis, hyperglycemia with a CBG in the 229 range and also mild signs of dehydration on physical exam with  mild hyponatremia/hypochloremia. [facility] has been called to admit the patient for further evaluation and treatment of acute pancreatitis.  Hospital Course:  Acute pancreatitis -Possibly secondary to gallstones; patient denies any alcohol intake; currently not on diuretics for statins -Upon admission, lipase was over 3000, amylase 1999 -CT abdomen: No explanation for patient's left-sided abdominal pain -Abdominal ultrasound: Hepatic steatosis, cholelithiasis with gallbladder distention, and no cholecystitis -Continue conservative management, bowel rest, pain control, IVF,antiemetics -Amylase, lipase, LFTs all trending downward -Gen. surgery consulted and appreciated -s/p laparoscopic cholecystectomy  -patient will need to follow with [facility] at the specified time. -Continue soft/low fat diet  Nausea -Resolved, Secondary to the above  Hyperglycemia -No history of diabetes -Patient would like to follow up with PCP -Hemoglobin A1c 6.6  Irritable bowel syndrome -Continue supportive care  Transaminitis -Pamelor held -Ultrasound as noted above -Hepatitis panel: Negative -Improving  GERD  -Continue PPI  Depression -Pamelor held  Normocytic Anemia -Hemoglobin 9.8 --> 11.2 (without transfusion) -Initial drop likely secondary to dilutional effect from IV fluids  Procedures  RUQ [place]  Consults  Gen. Surgery  Discharge Exam: Filed Vitals:   08/18/15 0520  BP: 161/74  Pulse: 83  Temp: 98.9 F (37.2 C)  Resp: 15   Exam  General: Well developed, well nourished, NAD  HEENT: NCAT, mucous membranes moist.   Cardiovascular: S1 S2 auscultated, RRR, 2/6 SEM  Respiratory: Clear to auscultation  Abdomen: Soft, nontender, nondistended, + bowel sounds, incisions clean, dry, intact  Extremities: warm dry without cyanosis clubbing or edema  Neuro: AAOx3, nonfocal  Psych: Normal affect and demeanor, appropriate  Discharge Instructions  Discharge Instructions      Discharge instructions    Complete by:  As directed   Patient will be discharged to home.  Patient  will need to follow up with primary care provider within one week of discharge, repeat CMP and lipase at that time.  Follow up with [facility] at specified time.  Patient should continue medications as prescribed.  Patient should follow a low fat diet.     Increase activity slowly    Complete by:  As directed             Medication List    TAKE these medications        cetirizine 10 MG tablet  Commonly known as:  ZYRTEC  Take 10 mg by mouth daily.     HYDROcodone-acetaminophen 5-325 MG per tablet  Commonly known as:  NORCO/VICODIN  Take 1 tablet by mouth every 4 (four) hours as needed for moderate pain.     multivitamin with minerals Tabs tablet  Take 1 tablet by mouth daily.     nortriptyline 25 MG capsule  Commonly known as:  PAMELOR  Take 1 capsule by mouth daily.     ranitidine 150 MG tablet  Commonly known as:  ZANTAC  Take 150 mg by mouth daily as needed for heartburn.       Allergies  Allergen Reactions  . Sulfur Hives       Follow-up Information    Follow up with [facility] [place]. Go on 09/07/2015.   Why:  For post-operation check. Your appointment is at 2:00pm, please arrive at least 30 min before your appointment to complete your check in paperwork.  If you are unable to arrive 30 min prior to your appointment time we may have to cancel or reschedule you   Contact information:   [address] [place] [address] [phone]       The results of significant diagnostics from this hospitalization (including imaging, microbiology, ancillary and laboratory) are listed below for reference.    Significant Diagnostic Studies: Dg Cholangiogram Operative  08/17/2015   CLINICAL DATA:  Gallstone  EXAM: INTRAOPERATIVE CHOLANGIOGRAM  TECHNIQUE: Cholangiographic images from the C-arm fluoroscopic device were  submitted for interpretation post-operatively. Please see the procedural report for the amount of contrast and the fluoroscopy time utilized.  COMPARISON:  None.  FINDINGS: Contrast fills the biliary tree and duodenum. There are no filling defects in the common bile duct.  IMPRESSION: Patent biliary tree.   Electronically Signed   By: Jolaine Click M.D.   On: 08/17/2015 12:37   Ct Abdomen Pelvis W Contrast  08/15/2015   CLINICAL DATA:  Left-sided abdominal pain radiating to the epigastric area. Symptoms began yesterday.  EXAM: CT ABDOMEN AND PELVIS WITH CONTRAST  TECHNIQUE: Multidetector CT imaging of the abdomen and pelvis was performed using the standard protocol following bolus administration of intravenous contrast.  CONTRAST:  OMNIPAQUE IOHEXOL 300 MG/ML  SOLN  COMPARISON:  CT abdomen pelvis -08/30/2010  FINDINGS: Normal hepatic contour. Punctate calcification within the left lobe of the liver (image 25, series 201), likely the sequela of prior granulomatous infection. No discrete hepatic lesions. Normal appearance of the gallbladder. No radiopaque gallstones. No intra extrahepatic biliary duct dilatation. No ascites.  There is symmetric enhancement and excretion of the bilateral kidneys. 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 No evidence urinary obstruction or perinephric stranding. No renal stones.  Normal appearance of the bilateral adrenal glands, pancreas and spleen.  Ingested enteric contrast extends to the level of the mid/distal small bowel. The descending colon is underdistended. Moderate colonic stool burden within the cecum and ascending colon. No evidence of enteric obstruction. The appendix is not visualized  compatible with provided surgical history. No pneumoperitoneum, pneumatosis or portal venous gas.  Scattered minimal amount of atherosclerotic plaque within a normal caliber abdominal aorta. The major branch vessels of the abdominal aorta appear patent on this non CTA examination.  No bulky retroperitoneal, mesenteric, pelvic or inguinal lymphadenopathy.  Post hysterectomy. Normal appearance of the urinary bladder given degree distention. No discrete adnexal lesion. Several phleboliths are seen with the lower pelvis bilaterally. No free fluid within the pelvic cul-de-sac.  Limited visualization of lower thorax demonstrates minimal dependent subpleural ground-glass atelectasis. No discrete focal airspace opacities. No pleural effusion.  Punctate (approximately 4 and 3 mm nodules) within the right middle lobe (representative images 2 and 4, series 205) are unchanged since the 2011 examination and thus of benign etiology.  Normal heart size.  No pericardial effusion.  No acute or aggressive osseous abnormalities. Mild degenerative change of the pubic symphysis.  Regional soft tissues appear normal.  IMPRESSION: 1. No definite explanation for patient's radiating left-sided abdominal pain. 2. 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 No evidence of acute urinary obstruction.   Electronically Signed   By: Simonne Come M.D.   On: 08/15/2015 09:17   US Abdomen Limited Ruq  08/15/2015   CLINICAL DATA:  Elevated liver function tests.  EXAM: US ABDOMEN LIMITED - RIGHT UPPER QUADRANT  COMPARISON:  CT of earlier in the day  FINDINGS: Gallbladder:  Multiple gallstones. Mild Gallbladder distension. No wall thickening or pericholecystic fluid. Patient was  pre-medicated, therefore evaluation for sonographic Murphy's sign limited.  Common bile duct:  Diameter: Normal, 4 mm.  Liver:  Increased echogenicity, suggesting mild hepatic steatosis.  Incidental note is made of right-sided caliectasis, as described on CT.  IMPRESSION: 1. Hepatic steatosis. 2. Cholelithiasis with gallbladder distention but no specific evidence of acute cholecystitis. No evidence of inflammation on the CT of earlier today. 3. No biliary duct dilatation.   Electronically Signed   By: Jeronimo Greaves M.D.   On: 08/15/2015 16:33    Microbiology: No results found for this or any previous visit (from the past 240 hour(s)).   Labs: Basic Metabolic Panel:  Recent Labs Lab 08/15/15 0315 08/15/15 1200 08/16/15 1427 08/18/15 0333  NA 134*  --  139 134*  K 4.2  --  4.0 3.7  CL 98*  --  107 100*  CO2 29  --  22 27  GLUCOSE 229*  --  61* 122*  BUN 9  --  6 <5*  CREATININE 0.68 0.60 0.59 0.59  CALCIUM 9.0  --  8.6* 8.6*   Liver Function Tests:  Recent Labs Lab 08/15/15 0315 08/16/15 1427 08/18/15 0333  AST 1662* 322* 141*  ALT 1208* 630* 372*  ALKPHOS 155* 123 117  BILITOT 1.1 0.7 0.6  PROT 7.4 6.7 7.0  ALBUMIN 3.8 3.5 3.5    Recent Labs Lab 08/15/15 0315 08/15/15 1200 08/16/15 1427 08/17/15 0250 08/18/15 0333  LIPASE >3000*  --  58*  --  23  AMYLASE  --  1999*  --  167* 82   No results for input(s): AMMONIA in the last 168 hours. CBC:  Recent Labs Lab 08/15/15 0315 08/15/15 1200 08/16/15 1427 08/18/15 0333  WBC 7.9 8.9 8.4 8.5  HGB 12.3 11.2* 9.8* 11.2*  HCT 37.5 34.3* 31.5* 34.9*  MCV 87.8 86.8 88.5 87.9  PLT 325 336 297 255   Cardiac Enzymes: No results for input(s): CKTOTAL, CKMB, CKMBINDEX, TROPONINI in the last 168 hours. BNP: BNP (last 3 results) No results for input(s): BNP in the last 8760 hours.  ProBNP (last 3 results) No results for input(s): PROBNP in the last 8760 hours.  CBG:  Recent Labs Lab 08/15/15 0950  GLUCAP 135*        Signed:  Edsel Petrin  [facility] 08/18/2015, 8:55 AM

## 2015-08-18 NOTE — Progress Notes (Signed)
Rhonda Norris to be D/C'd Home per MD order.  Discussed with the patient and all questions fully answered.  VSS, Skin clean, dry and intact without evidence of skin break down, no evidence of skin tears noted. IV catheter discontinued intact. Site without signs and symptoms of complications. Dressing and pressure applied.  An After Visit Summary was printed and given to the patient. Patient received prescription.  D/c education completed with patient/family including follow up instructions, medication list, d/c activities limitations if indicated, with other d/c instructions as indicated by MD - patient able to verbalize understanding, all questions fully answered.   Patient instructed to return to ED, call 911, or call MD for any changes in condition.   Patient escorted via WC, and D/C home via private auto.  [name] 08/18/2015 11:31 AM

## 2015-08-18 NOTE — Discharge Instructions (Signed)

## 2015-10-22 ENCOUNTER — Other Ambulatory Visit: Payer: Self-pay

## 2015-10-22 DIAGNOSIS — Z1231 Encounter for screening mammogram for malignant neoplasm of breast: Secondary | ICD-10-CM

## 2015-11-05 ENCOUNTER — Ambulatory Visit
Admission: RE | Admit: 2015-11-05 | Discharge: 2015-11-05 | Disposition: A | Discharge status: Home / Self Care | Payer: BC Managed Care – PPO | Source: Ambulatory Visit

## 2015-11-05 DIAGNOSIS — Z1231 Encounter for screening mammogram for malignant neoplasm of breast: Secondary | ICD-10-CM

## 2015-12-20 DIAGNOSIS — M8580 Other specified disorders of bone density and structure, unspecified site: Secondary | ICD-10-CM

## 2015-12-20 HISTORY — DX: Other specified disorders of bone density and structure, unspecified site: M85.80

## 2016-01-04 ENCOUNTER — Encounter: Payer: Self-pay | Admitting: Gynecology

## 2016-01-04 ENCOUNTER — Ambulatory Visit (INDEPENDENT_AMBULATORY_CARE_PROVIDER_SITE_OTHER): Payer: BC Managed Care – PPO | Admitting: Gynecology

## 2016-01-04 VITALS — BP 118/78 | Ht 64.0 in | Wt 175.0 lb

## 2016-01-04 DIAGNOSIS — Z01419 Encounter for gynecological examination (general) (routine) without abnormal findings: Secondary | ICD-10-CM

## 2016-01-04 DIAGNOSIS — N898 Other specified noninflammatory disorders of vagina: Secondary | ICD-10-CM

## 2016-01-04 DIAGNOSIS — M8580 Other specified disorders of bone density and structure, unspecified site: Secondary | ICD-10-CM

## 2016-01-04 LAB — URINALYSIS W MICROSCOPIC + REFLEX CULTURE
BILIRUBIN URINE: NEGATIVE
CASTS: NONE SEEN [LPF]
CRYSTALS: NONE SEEN [HPF]
GLUCOSE, UA: NEGATIVE
Hgb urine dipstick: NEGATIVE
KETONES UR: NEGATIVE
Leukocytes, UA: NEGATIVE
Nitrite: NEGATIVE
PROTEIN: NEGATIVE
RBC / HPF: NONE SEEN RBC/HPF (ref <=2)
Specific Gravity, Urine: <1.005 (ref 1.001–1.035)
Yeast: NONE SEEN [HPF]
pH: 5 (ref 5.0–8.0)

## 2016-01-04 LAB — WET PREP FOR TRICH, YEAST, CLUE
TRICH WET PREP: NONE SEEN
YEAST WET PREP: NONE SEEN

## 2016-01-04 MED ORDER — CLINDAMYCIN PHOSPHATE 2 % VA CREA: 1.0000 | TOPICAL_CREAM | Freq: Every day | VAGINAL | Status: DC

## 2016-01-04 NOTE — Patient Instructions (Signed)
Follow up for the bone density as scheduled.  You may obtain a copy of any labs that were done today by logging onto MyChart as outlined in the instructions provided with your AVS (after visit summary). The office will not call with normal lab results but certainly if there are any significant abnormalities then we will contact you.   Health Maintenance Adopting a healthy lifestyle and getting preventive care can go a long way to promote health and wellness. Talk with your health care provider about what schedule of regular examinations is right for you. This is a good chance for you to check in with your provider about disease prevention and staying healthy. In between checkups, there are plenty of things you can do on your own. Experts have done a lot of research about which lifestyle changes and preventive measures are most likely to keep you healthy. Ask your health care provider for more information. WEIGHT AND DIET  Eat a healthy diet  Be sure to include plenty of vegetables, fruits, low-fat dairy products, and lean protein.  Do not eat a lot of foods high in solid fats, added sugars, or salt.  Get regular exercise. This is one of the most important things you can do for your health.  Most adults should exercise for at least 150 minutes each week. The exercise should increase your heart rate and make you sweat (moderate-intensity exercise).  Most adults should also do strengthening exercises at least twice a week. This is in addition to the moderate-intensity exercise.  Maintain a healthy weight  Body mass index (BMI) is a measurement that can be used to identify possible weight problems. It estimates body fat based on height and weight. Your health care provider can help determine your BMI and help you achieve or maintain a healthy weight.  For females 61 years of age and older:   A BMI below 18.5 is considered underweight.  A BMI of 18.5 to 24.9 is normal.  A BMI of 25 to 29.9  is considered overweight.  A BMI of 30 and above is considered obese.  Watch levels of cholesterol and blood lipids  You should start having your blood tested for lipids and cholesterol at 65 years of age, then have this test every 5 years.  You may need to have your cholesterol levels checked more often if:  Your lipid or cholesterol levels are high.  You are older than 65 years of age.  You are at high risk for heart disease.  CANCER SCREENING   Lung Cancer  Lung cancer screening is recommended for adults 62-75 years old who are at high risk for lung cancer because of a history of smoking.  A yearly low-dose CT scan of the lungs is recommended for people who:  Currently smoke.  Have quit within the past 15 years.  Have at least a 30-pack-year history of smoking. A pack year is smoking an average of one pack of cigarettes a day for 1 year.  Yearly screening should continue until it has been 15 years since you quit.  Yearly screening should stop if you develop a health problem that would prevent you from having lung cancer treatment.  Breast Cancer  Practice breast self-awareness. This means understanding how your breasts normally appear and feel.  It also means doing regular breast self-exams. Let your health care provider know about any changes, no matter how small.  If you are in your 20s or 30s, you should have a clinical breast  exam (CBE) by a health care provider every 1-3 years as part of a regular health exam.  If you are 36 or older, have a CBE every year. Also consider having a breast X-ray (mammogram) every year.  If you have a family history of breast cancer, talk to your health care provider about genetic screening.  If you are at high risk for breast cancer, talk to your health care provider about having an MRI and a mammogram every year.  Breast cancer gene (BRCA) assessment is recommended for women who have family members with BRCA-related cancers.  BRCA-related cancers include:  Breast.  Ovarian.  Tubal.  Peritoneal cancers.  Results of the assessment will determine the need for genetic counseling and BRCA1 and BRCA2 testing. Cervical Cancer Routine pelvic examinations to screen for cervical cancer are no longer recommended for nonpregnant women who are considered low risk for cancer of the pelvic organs (ovaries, uterus, and vagina) and who do not have symptoms. A pelvic examination may be necessary if you have symptoms including those associated with pelvic infections. Ask your health care provider if a screening pelvic exam is right for you.   The Pap test is the screening test for cervical cancer for women who are considered at risk.  If you had a hysterectomy for a problem that was not cancer or a condition that could lead to cancer, then you no longer need Pap tests.  If you are older than 65 years, and you have had normal Pap tests for the past 10 years, you no longer need to have Pap tests.  If you have had past treatment for cervical cancer or a condition that could lead to cancer, you need Pap tests and screening for cancer for at least 20 years after your treatment.  If you no longer get a Pap test, assess your risk factors if they change (such as having a new sexual partner). This can affect whether you should start being screened again.  Some women have medical problems that increase their chance of getting cervical cancer. If this is the case for you, your health care provider may recommend more frequent screening and Pap tests.  The human papillomavirus (HPV) test is another test that may be used for cervical cancer screening. The HPV test looks for the virus that can cause cell changes in the cervix. The cells collected during the Pap test can be tested for HPV.  The HPV test can be used to screen women 8 years of age and older. Getting tested for HPV can extend the interval between normal Pap tests from three to  five years.  An HPV test also should be used to screen women of any age who have unclear Pap test results.  After 65 years of age, women should have HPV testing as often as Pap tests.  Colorectal Cancer  This type of cancer can be detected and often prevented.  Routine colorectal cancer screening usually begins at 65 years of age and continues through 65 years of age.  Your health care provider may recommend screening at an earlier age if you have risk factors for colon cancer.  Your health care provider may also recommend using home test kits to check for hidden blood in the stool.  A small camera at the end of a tube can be used to examine your colon directly (sigmoidoscopy or colonoscopy). This is done to check for the earliest forms of colorectal cancer.  Routine screening usually begins at age 26.  Direct examination of the colon should be repeated every 5-10 years through 65 years of age. However, you may need to be screened more often if early forms of precancerous polyps or small growths are found. Skin Cancer  Check your skin from head to toe regularly.  Tell your health care provider about any new moles or changes in moles, especially if there is a change in a mole's shape or color.  Also tell your health care provider if you have a mole that is larger than the size of a pencil eraser.  Always use sunscreen. Apply sunscreen liberally and repeatedly throughout the day.  Protect yourself by wearing long sleeves, pants, a wide-brimmed hat, and sunglasses whenever you are outside. HEART DISEASE, DIABETES, AND HIGH BLOOD PRESSURE   Have your blood pressure checked at least every 1-2 years. High blood pressure causes heart disease and increases the risk of stroke.  If you are between 76 years and 55 years old, ask your health care provider if you should take aspirin to prevent strokes.  Have regular diabetes screenings. This involves taking a blood sample to check your  fasting blood sugar level.  If you are at a normal weight and have a low risk for diabetes, have this test once every three years after 65 years of age.  If you are overweight and have a high risk for diabetes, consider being tested at a younger age or more often. PREVENTING INFECTION  Hepatitis B  If you have a higher risk for hepatitis B, you should be screened for this virus. You are considered at high risk for hepatitis B if:  You were born in a country where hepatitis B is common. Ask your health care provider which countries are considered high risk.  Your parents were born in a high-risk country, and you have not been immunized against hepatitis B (hepatitis B vaccine).  You have HIV or AIDS.  You use needles to inject street drugs.  You live with someone who has hepatitis B.  You have had sex with someone who has hepatitis B.  You get hemodialysis treatment.  You take certain medicines for conditions, including cancer, organ transplantation, and autoimmune conditions. Hepatitis C  Blood testing is recommended for:  Everyone born from [birth year] through 1965.  Anyone with known risk factors for hepatitis C. Sexually transmitted infections (STIs)  You should be screened for sexually transmitted infections (STIs) including gonorrhea and chlamydia if:  You are sexually active and are younger than 65 years of age.  You are older than 66 years of age and your health care provider tells you that you are at risk for this type of infection.  Your sexual activity has changed since you were last screened and you are at an increased risk for chlamydia or gonorrhea. Ask your health care provider if you are at risk.  If you do not have HIV, but are at risk, it may be recommended that you take a prescription medicine daily to prevent HIV infection. This is called pre-exposure prophylaxis (PrEP). You are considered at risk if:  You are sexually active and do not regularly use condoms or  know the HIV status of your partner(s).  You take drugs by injection.  You are sexually active with a partner who has HIV. Talk with your health care provider about whether you are at high risk of being infected with HIV. If you choose to begin PrEP, you should first be tested for HIV. You should then be  tested every 3 months for as long as you are taking PrEP.  PREGNANCY   If you are premenopausal and you may become pregnant, ask your health care provider about preconception counseling.  If you may become pregnant, take 400 to 800 micrograms (mcg) of folic acid every day.  If you want to prevent pregnancy, talk to your health care provider about birth control (contraception). OSTEOPOROSIS AND MENOPAUSE   Osteoporosis is a disease in which the bones lose minerals and strength with aging. This can result in serious bone fractures. Your risk for osteoporosis can be identified using a bone density scan.  If you are 25 years of age or older, or if you are at risk for osteoporosis and fractures, ask your health care provider if you should be screened.  Ask your health care provider whether you should take a calcium or vitamin D supplement to lower your risk for osteoporosis.  Menopause may have certain physical symptoms and risks.  Hormone replacement therapy may reduce some of these symptoms and risks. Talk to your health care provider about whether hormone replacement therapy is right for you.  HOME CARE INSTRUCTIONS   Schedule regular health, dental, and eye exams.  Stay current with your immunizations.   Do not use any tobacco products including cigarettes, chewing tobacco, or electronic cigarettes.  If you are pregnant, do not drink alcohol.  If you are breastfeeding, limit how much and how often you drink alcohol.  Limit alcohol intake to no more than 1 drink per day for nonpregnant women. One drink equals 12 ounces of beer, 5 ounces of wine, or 1 ounces of hard liquor.  Do  not use street drugs.  Do not share needles.  Ask your health care provider for help if you need support or information about quitting drugs.  Tell your health care provider if you often feel depressed.  Tell your health care provider if you have ever been abused or do not feel safe at home. Document Released: 06/20/2011 Document Revised: 04/21/2014 Document Reviewed: 11/06/2013 [facility] Patient Information 2015 [place], [place]. This information is not intended to replace advice given to you by your health care provider. Make sure you discuss any questions you have with your health care provider.

## 2016-01-04 NOTE — Progress Notes (Signed)
Rhonda Norris March 23, 1951 [id number]        65 y.o.  G3P3  for annual exam.  Several issues noted below.  Past medical history,surgical history, problem list, medications, allergies, family history and social history were all reviewed and documented as reviewed in the EPIC chart.  ROS:  Performed with pertinent positives and negatives included in the history, assessment and plan.   Additional significant findings :  none   Exam: Rhonda PortelaKim Norris assistant Filed Vitals:   01/04/16 1142  BP: 118/78  Height: 5\' 4"  (1.626 m)  Weight: 175 lb (79.379 kg)   General appearance:  Normal affect, orientation and appearance. Skin: Grossly normal HEENT: Without gross lesions.  No cervical or supraclavicular adenopathy. Thyroid normal.  Lungs:  Clear without wheezing, rales or rhonchi Cardiac: RR, without RMG Abdominal:  Soft, nontender, without masses, guarding, rebound, organomegaly or hernia Breasts:  Examined lying and sitting without masses, retractions, discharge or axillary adenopathy. Pelvic:  Ext/BUS/vagina with scant discharge. Mild generalized erythema  Adnexa  Without masses or tenderness    Anus and perineum  Normal   Rectovaginal  Normal sphincter tone without palpated masses or tenderness.    Assessment/Plan:  65 y.o. G3P3 female for annual exam.  1. Postmenopausal/atrophic genital changes. Status post TVH BSO 1997 for leiomyoma and endometriosis. Had been on ERT but discontinued and doing well without significant hot flushes, night sweats or vaginal dryness. Videomonitoring report any issues. 2. Vaginal irritation. Patient notes external vaginal irritation. She does appear to be somewhat erythematous. Scant discharge. Wet prep consistent with bacterial vaginosis. Urinalysis showed few bacteria. We'll culture and treat if needed. Will treat as bacterial vaginosis with Cleocin vaginal cream nightly 7 nights. Follow up if symptoms persist, worsen or recur. 3. Osteopenia.  DEXA 12/2013  T score -2 FRAX 8.7%/0.8%. Repeat DEXA now a 2 year interval and patient will schedule. 4. Mammography 10/2015. Continue with annual mammography when due. SBE monthly reviewed. 5. Colonoscopy 2014. Repeat at their recommended intravitreal. 6. Pap smear 2015. No Pap smear done today. No history of significant abnormal Pap smears. Options to stop screening altogether she is status post hysterectomy for benign indications versus less for screening intervals reviewed. Will readdress on annual basis. 7. Health maintenance. Routine blood work done as patient has this done at her primary physician's office. Follow up for bone density otherwise 1 year, sooner as needed   Rhonda Norris,Rhonda Norris P MD, 12:24 PM 01/04/2016

## 2016-01-05 LAB — URINE CULTURE
COLONY COUNT: NO GROWTH
Organism ID, Bacteria: NO GROWTH

## 2016-01-19 ENCOUNTER — Ambulatory Visit (INDEPENDENT_AMBULATORY_CARE_PROVIDER_SITE_OTHER): Payer: BC Managed Care – PPO

## 2016-01-19 ENCOUNTER — Encounter: Payer: Self-pay | Admitting: Gynecology

## 2016-01-19 ENCOUNTER — Other Ambulatory Visit: Payer: Self-pay | Admitting: Gynecology

## 2016-01-19 DIAGNOSIS — Z13820 Encounter for screening for osteoporosis: Secondary | ICD-10-CM

## 2016-01-19 DIAGNOSIS — M8589 Other specified disorders of bone density and structure, multiple sites: Secondary | ICD-10-CM

## 2016-01-19 DIAGNOSIS — M899 Disorder of bone, unspecified: Secondary | ICD-10-CM | POA: Diagnosis not present

## 2016-01-19 DIAGNOSIS — M8580 Other specified disorders of bone density and structure, unspecified site: Secondary | ICD-10-CM

## 2016-06-17 ENCOUNTER — Observation Stay (HOSPITAL_COMMUNITY): Payer: Medicare Other

## 2016-06-17 ENCOUNTER — Encounter (HOSPITAL_COMMUNITY): Payer: Self-pay

## 2016-06-17 ENCOUNTER — Emergency Department (HOSPITAL_COMMUNITY): Payer: Medicare Other

## 2016-06-17 ENCOUNTER — Observation Stay (HOSPITAL_COMMUNITY)
Admission: EM | Admit: 2016-06-17 | Discharge: 2016-06-20 | Disposition: A | Discharge status: Home / Self Care | Payer: Medicare Other | Attending: Internal Medicine | Admitting: Internal Medicine

## 2016-06-17 ENCOUNTER — Encounter: Payer: Self-pay | Admitting: Neurology

## 2016-06-17 ENCOUNTER — Ambulatory Visit (INDEPENDENT_AMBULATORY_CARE_PROVIDER_SITE_OTHER): Payer: Medicare Other | Admitting: Neurology

## 2016-06-17 VITALS — BP 146/82 | HR 78 | Ht 64.0 in | Wt 169.0 lb

## 2016-06-17 DIAGNOSIS — I10 Essential (primary) hypertension: Secondary | ICD-10-CM | POA: Diagnosis not present

## 2016-06-17 DIAGNOSIS — R42 Dizziness and giddiness: Secondary | ICD-10-CM

## 2016-06-17 DIAGNOSIS — E669 Obesity, unspecified: Secondary | ICD-10-CM | POA: Diagnosis not present

## 2016-06-17 DIAGNOSIS — R479 Unspecified speech disturbances: Secondary | ICD-10-CM | POA: Diagnosis not present

## 2016-06-17 DIAGNOSIS — G459 Transient cerebral ischemic attack, unspecified: Secondary | ICD-10-CM | POA: Diagnosis present

## 2016-06-17 DIAGNOSIS — G9389 Other specified disorders of brain: Secondary | ICD-10-CM | POA: Diagnosis not present

## 2016-06-17 DIAGNOSIS — R269 Unspecified abnormalities of gait and mobility: Secondary | ICD-10-CM | POA: Insufficient documentation

## 2016-06-17 DIAGNOSIS — E785 Hyperlipidemia, unspecified: Secondary | ICD-10-CM | POA: Diagnosis not present

## 2016-06-17 DIAGNOSIS — M797 Fibromyalgia: Secondary | ICD-10-CM | POA: Insufficient documentation

## 2016-06-17 DIAGNOSIS — Z7982 Long term (current) use of aspirin: Secondary | ICD-10-CM | POA: Insufficient documentation

## 2016-06-17 DIAGNOSIS — K589 Irritable bowel syndrome without diarrhea: Secondary | ICD-10-CM | POA: Diagnosis not present

## 2016-06-17 DIAGNOSIS — G458 Other transient cerebral ischemic attacks and related syndromes: Secondary | ICD-10-CM | POA: Diagnosis not present

## 2016-06-17 DIAGNOSIS — Z7902 Long term (current) use of antithrombotics/antiplatelets: Secondary | ICD-10-CM | POA: Diagnosis not present

## 2016-06-17 DIAGNOSIS — Z6828 Body mass index (BMI) 28.0-28.9, adult: Secondary | ICD-10-CM | POA: Insufficient documentation

## 2016-06-17 DIAGNOSIS — K219 Gastro-esophageal reflux disease without esophagitis: Secondary | ICD-10-CM | POA: Diagnosis present

## 2016-06-17 DIAGNOSIS — R200 Anesthesia of skin: Principal | ICD-10-CM | POA: Insufficient documentation

## 2016-06-17 DIAGNOSIS — R202 Paresthesia of skin: Secondary | ICD-10-CM

## 2016-06-17 LAB — I-STAT CHEM 8, ED
BUN: 13 mg/dL (ref 6–20)
CALCIUM ION: 0.96 mmol/L — AB (ref 1.12–1.23)
CHLORIDE: 106 mmol/L (ref 101–111)
CREATININE: 0.7 mg/dL (ref 0.44–1.00)
GLUCOSE: 161 mg/dL — AB (ref 65–99)
HCT: 40 % (ref 36.0–46.0)
Hemoglobin: 13.6 g/dL (ref 12.0–15.0)
Potassium: 4.2 mmol/L (ref 3.5–5.1)
SODIUM: 138 mmol/L (ref 135–145)
TCO2: 23 mmol/L (ref 0–100)

## 2016-06-17 LAB — DIFFERENTIAL
Basophils Absolute: 0 10*3/uL (ref 0.0–0.1)
Basophils Relative: 1 %
EOS ABS: 0.3 10*3/uL (ref 0.0–0.7)
EOS PCT: 4 %
LYMPHS ABS: 5.3 10*3/uL — AB (ref 0.7–4.0)
LYMPHS PCT: 61 %
MONOS PCT: 7 %
Monocytes Absolute: 0.6 10*3/uL (ref 0.1–1.0)
Neutro Abs: 2.5 10*3/uL (ref 1.7–7.7)
Neutrophils Relative %: 29 %

## 2016-06-17 LAB — I-STAT TROPONIN, ED: TROPONIN I, POC: 0 ng/mL (ref 0.00–0.08)

## 2016-06-17 LAB — COMPREHENSIVE METABOLIC PANEL
ALK PHOS: 80 U/L (ref 38–126)
ALT: 26 U/L (ref 14–54)
AST: 35 U/L (ref 15–41)
Albumin: 3.9 g/dL (ref 3.5–5.0)
Anion gap: 10 (ref 5–15)
BILIRUBIN TOTAL: 0.6 mg/dL (ref 0.3–1.2)
BUN: 11 mg/dL (ref 6–20)
CALCIUM: 9.1 mg/dL (ref 8.9–10.3)
CO2: 22 mmol/L (ref 22–32)
Chloride: 105 mmol/L (ref 101–111)
Creatinine, Ser: 0.73 mg/dL (ref 0.44–1.00)
GFR calc Af Amer: >60 mL/min (ref >60)
Glucose, Bld: 159 mg/dL — ABNORMAL HIGH (ref 65–99)
POTASSIUM: 4.2 mmol/L (ref 3.5–5.1)
Sodium: 137 mmol/L (ref 135–145)
TOTAL PROTEIN: 7.1 g/dL (ref 6.5–8.1)

## 2016-06-17 LAB — CBC
HEMATOCRIT: 40.3 % (ref 36.0–46.0)
HEMOGLOBIN: 12.7 g/dL (ref 12.0–15.0)
MCH: 28.3 pg (ref 26.0–34.0)
MCHC: 31.5 g/dL (ref 30.0–36.0)
MCV: 89.8 fL (ref 78.0–100.0)
Platelets: 375 10*3/uL (ref 150–400)
RBC: 4.49 MIL/uL (ref 3.87–5.11)
RDW: 13.5 % (ref 11.5–15.5)
WBC: 8.8 10*3/uL (ref 4.0–10.5)

## 2016-06-17 LAB — PROTIME-INR
INR: 0.91 (ref 0.00–1.49)
Prothrombin Time: 12.5 seconds (ref 11.6–15.2)

## 2016-06-17 LAB — APTT: aPTT: 32 seconds (ref 24–37)

## 2016-06-17 LAB — CBG MONITORING, ED: Glucose-Capillary: 131 mg/dL — ABNORMAL HIGH (ref 65–99)

## 2016-06-17 MED ORDER — IBUPROFEN 200 MG PO TABS: 200.0000 mg | ORAL_TABLET | Freq: Four times a day (QID) | ORAL | Status: DC | PRN

## 2016-06-17 MED ORDER — ENOXAPARIN SODIUM 40 MG/0.4ML ~~LOC~~ SOLN
40.0000 mg | SUBCUTANEOUS | Status: DC
  Administered 2016-06-18 – 2016-06-20 (×3): 40 mg via SUBCUTANEOUS
  Filled 2016-06-17 (×3): qty 0.4

## 2016-06-17 MED ORDER — FAMOTIDINE 20 MG PO TABS
20.0000 mg | ORAL_TABLET | Freq: Every day | ORAL | Status: DC
  Administered 2016-06-18 – 2016-06-20 (×3): 20 mg via ORAL
  Filled 2016-06-17 (×3): qty 1

## 2016-06-17 MED ORDER — NORTRIPTYLINE HCL 25 MG PO CAPS
25.0000 mg | ORAL_CAPSULE | Freq: Every day | ORAL | Status: DC
  Administered 2016-06-17 – 2016-06-19 (×3): 25 mg via ORAL
  Filled 2016-06-17 (×3): qty 1

## 2016-06-17 MED ORDER — ADULT MULTIVITAMIN W/MINERALS CH
1.0000 | ORAL_TABLET | Freq: Every day | ORAL | Status: DC
Start: 2016-06-18 — End: 2016-06-20
  Administered 2016-06-18 – 2016-06-20 (×3): 1 via ORAL
  Filled 2016-06-17 (×3): qty 1

## 2016-06-17 MED ORDER — SENNOSIDES-DOCUSATE SODIUM 8.6-50 MG PO TABS
1.0000 | ORAL_TABLET | Freq: Every evening | ORAL | Status: DC | PRN
  Filled 2016-06-17: qty 1

## 2016-06-17 MED ORDER — POLYETHYLENE GLYCOL 3350 17 G PO PACK
17.0000 g | PACK | Freq: Every day | ORAL | Status: DC
  Administered 2016-06-18 – 2016-06-20 (×3): 17 g via ORAL
  Filled 2016-06-17 (×3): qty 1

## 2016-06-17 MED ORDER — CLOPIDOGREL BISULFATE 75 MG PO TABS
75.0000 mg | ORAL_TABLET | Freq: Every day | ORAL | Status: DC
  Administered 2016-06-17 – 2016-06-20 (×4): 75 mg via ORAL
  Filled 2016-06-17 (×4): qty 1

## 2016-06-17 MED ORDER — ALPRAZOLAM 0.5 MG PO TABS: 0.5000 mg | ORAL_TABLET | Freq: Once | ORAL | Status: DC | PRN

## 2016-06-17 MED ORDER — LORAZEPAM 2 MG/ML IJ SOLN
1.0000 mg | Freq: Once | INTRAMUSCULAR | Status: AC
  Administered 2016-06-17: 1 mg via INTRAVENOUS
  Filled 2016-06-17: qty 1

## 2016-06-17 MED ORDER — ASPIRIN 325 MG PO TABS
325.0000 mg | ORAL_TABLET | Freq: Every day | ORAL | Status: DC
  Administered 2016-06-18: 325 mg via ORAL
  Filled 2016-06-17: qty 1

## 2016-06-17 MED ORDER — ALPRAZOLAM 0.5 MG PO TABS: ORAL_TABLET | ORAL | Status: DC

## 2016-06-17 MED ORDER — STROKE: EARLY STAGES OF RECOVERY BOOK
Freq: Once | Status: AC
  Administered 2016-06-17: 20:00:00
  Filled 2016-06-17 (×2): qty 1

## 2016-06-17 NOTE — Consult Note (Signed)
NEURO HOSPITALIST CONSULT NOTE   Requestig physician: ER   Reason for Consult:Code Stroke   History obtained from:  Patient and Family  HPI:                                                                                                                                      Rhonda Norris is an 65 y.o. female who has had stuttering right face and arm numbness and tingling over the last few days.  Her BP was 167/75 today.  She has no known HTN before.  She saw a neurologist as outpatient when this all started and was put on ASA 81 mg which she took earlier today.  The symptoms went away today when EMS arrived but recurred at 6:20 pm enroute and resolved in the ER and have been on and off since.    Past Medical History  Diagnosis Date  . IBD (inflammatory bowel disease)   . Arthritis   . Carpal tunnel syndrome   . Osteopenia 12/2015    T score -1.9 FRAX 3.2%/ 0.2%  . Fibromyalgia   . Allergic rhinitis   . Pancreatitis, gallstone     Past Surgical History  Procedure Laterality Date  . Appendectomy    . Hand surgery      bilateral  . Knee arthroscopy      rt  . Neck surgery    . Tubal ligation    . 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 Vaginal hysterectomy bilateral salpingo-oophorectomy  11/1996    TVH BSO endometriosis/leiomyomata  . Pelvic laparoscopy  1992/1996    endometriosis  . Colposcopy    . Cholecystectomy N/A 08/17/2015    Procedure: LAPAROSCOPIC CHOLECYSTECTOMY WITH INTRAOPERATIVE CHOLANGIOGRAM;  Surgeon: Abigail Miyamoto, MD;  Location: [facility] OR;  Service: General;  Laterality: N/A;    Family History  Problem Relation Age of Onset  . Heart disease Mother   . Seizures Mother   . Heart disease Brother     Family History:  Social History:  reports that she has never smoked. She does not have any smokeless tobacco history on file. She reports that she does not drink alcohol or use illicit drugs.  Allergies   Allergen Reactions  . Sulfur Hives  . Dexilant [Dexlansoprazole] Rash    MEDICATIONS:  Prior to Admission:  (Not in a hospital admission)   ROS:                                                                                                                                       History obtained from the patient  General ROS: negative for - chills, fatigue, fever, night sweats, weight gain or weight loss Psychological ROS: negative for - behavioral disorder, hallucinations, memory difficulties, mood swings or suicidal ideation Ophthalmic ROS: negative for - blurry vision, double vision, eye pain or loss of vision ENT ROS: negative for - epistaxis, nasal discharge, oral lesions, sore throat, tinnitus or vertigo Allergy and Immunology ROS: negative for - hives or itchy/watery eyes Hematological and Lymphatic ROS: negative for - bleeding problems, bruising or swollen lymph nodes Endocrine ROS: negative for - galactorrhea, hair pattern changes, polydipsia/polyuria or temperature intolerance Respiratory ROS: negative for - cough, hemoptysis, shortness of breath or wheezing Cardiovascular ROS: negative for - chest pain, dyspnea on exertion, edema or irregular heartbeat Gastrointestinal ROS: negative for - abdominal pain, diarrhea, hematemesis, nausea/vomiting or stool incontinence Genito-Urinary ROS: negative for - dysuria, hematuria, incontinence or urinary frequency/urgency Musculoskeletal ROS: negative for - joint swelling or muscular weakness Neurological ROS: as noted in HPI Dermatological ROS: negative for rash and skin lesion changes   Blood pressure 167/72, pulse 86, temperature 98.3 F (36.8 C), temperature source Oral, resp. rate 13, height 5\' 4"  (1.626 m), weight 74.6 kg (164 lb 7.4 oz), SpO2 99 %.   Neurologic Examination:                                                                                                       HEENT-  Normocephalic, no lesions, without obvious abnormality.  Normal external eye and conjunctiva.  Normal TM's bilaterally.  Normal auditory canals and external ears. Normal external nose, mucus membranes and septum.  Normal pharynx. Cardiovascular- regular rate and rhythm, S1, S2 normal, no murmur, click, rub or gallop, pulses palpable throughout   Lungs- chest clear, no wheezing, rales, normal symmetric air entry, Heart exam - S1, S2 normal, no murmur, no gallop, rate regular Abdomen- soft, non-tender; bowel sounds normal; no masses,  no organomegaly Extremities- less then 2 second capillary refill Lymph-no adenopathy palpable Musculoskeletal-no joint tenderness, deformity or swelling Skin-dry  Neurological Examination Mental Status: Alert, oriented, thought content appropriate.  Speech fluent without evidence of aphasia.  Able to follow 3 step commands without difficulty. Cranial Nerves: II: Discs flat bilaterally;  Visual fields grossly normal, pupils equal, round, reactive to light and accommodation III,IV, VI: ptosis not present, extra-ocular motions intact bilaterally V,VII: smile symmetric, facial light touch sensation normal bilaterally VIII: hearing normal bilaterally IX,X: uvula rises symmetrically XI: bilateral shoulder shrug XII: midline tongue extension Motor: Right : Upper extremity   5/5    Left:     Upper extremity   5/5  Lower extremity   5/5     Lower extremity   5/5 Tone and bulk:normal tone throughout; no atrophy noted Sensory: Pinprick and light touch intact throughout, bilaterally Deep Tendon Reflexes: 2+ and symmetric throughout Plantars: Right: downgoing   Left: downgoing Cerebellar: normal finger-to-nose, normal rapid alternating movements and normal heel-to-shin test Gait: normal gait and station      Lab Results: Basic Metabolic Panel:  Recent Labs Lab 06/17/16 1842  NA 138  K 4.2  CL  106  GLUCOSE 161*  BUN 13  CREATININE 0.70    Liver Function Tests: No results for input(s): AST, ALT, ALKPHOS, BILITOT, PROT, ALBUMIN in the last 168 hours. No results for input(s): LIPASE, AMYLASE in the last 168 hours. No results for input(s): AMMONIA in the last 168 hours.  CBC:  Recent Labs Lab 06/17/16 1842  WBC 8.8  NEUTROABS 2.5  HGB 12.7  13.6  HCT 40.3  40.0  MCV 89.8  PLT 375    Cardiac Enzymes: No results for input(s): CKTOTAL, CKMB, CKMBINDEX, TROPONINI in the last 168 hours.  Lipid Panel: No results for input(s): CHOL, TRIG, HDL, CHOLHDL, VLDL, LDLCALC in the last 168 hours.  CBG:  Recent Labs Lab 06/17/16 1846  GLUCAP 131*    Microbiology: Results for orders placed or performed in visit on 01/04/16  WET PREP FOR TRICH, YEAST, CLUE     Status: Abnormal   Collection Time: 01/04/16 12:18 PM  Result Value Ref Range Status   Yeast Wet Prep HPF POC NONE SEEN NONE SEEN Final   Trich, Wet Prep NONE SEEN NONE SEEN Final   Clue Cells Wet Prep HPF POC FEW (A) NONE SEEN Final   WBC, Wet Prep HPF POC MANY (A) NONE SEEN Final    Comment: Bacteria - Too numerous to count (13-20) EPITH. CELLS PER HPF   Urine culture     Status: None   Collection Time: 01/04/16 12:19 PM  Result Value Ref Range Status   Colony Count NO GROWTH  Final   Organism ID, Bacteria NO GROWTH  Final    Coagulation Studies:  Recent Labs  06/17/16 1842  LABPROT 12.5  INR 0.91    Imaging: Ct Head Code Stroke W/o Cm  06/17/2016  CLINICAL DATA:  Code stroke.  Difficulty speaking EXAM: CT HEAD WITHOUT CONTRAST TECHNIQUE: Contiguous axial images were obtained from the base of the skull through the vertex without intravenous contrast. COMPARISON:  None. FINDINGS: Ventricle size normal.  Cerebral volume normal for age. Negative for acute or chronic infarction. Negative for intracranial hemorrhage. No shift of the midline structures Negative calvarium. Calcific density adjacent to the  inner table left frontal lobe may be a calcified meningioma or benign exostosis. IMPRESSION: No acute abnormality. These results were called by telephone at the time of interpretation on 06/17/2016 at 6:52 pm to Dr. Fredric Dine, who verbally acknowledged these results. Electronically Signed   By: Marlan Palau M.D.   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  Not a candidate for IV tPA due to waxing and waning symptoms and  minor sensory symptoms.  I recommend ASA 325 mg and Plavix 75 mg qd for next 3 months to prevent recurrence and then back to monotherapy.  She can be admitted for carotid dopplers and MRA angio of brain and neck for vascular assessment.  An TTE should be done too.  Permissive hypertension to 220/120 in the first 24 hours and then gradually decrease to normal levels.  Physical therapy.   Check lipids and consider statin initiation.  Check for DM.    Weston SettleShervin Marica Trentham MD [phone]  06/17/2016, 6:59 PM

## 2016-06-17 NOTE — Progress Notes (Signed)
[facility]    Provider:  Dr Lucia Gaskins Referring Provider: Catha Gosselin, MD Primary Care Physician:  Mickie Hillier, MD  CC:  Episode of dizziness  HPI:  Rhonda Norris is a 65 y.o. female here as a referral from Dr. Clarene Duke for episode of dizziness. Past medical history of pancreatitis, IBS/IBD, GERD, depression, seasonal allergic rhinitis, arthritis, fibromyalgia, pancreatitis secondary to gallstones. She got up this past Wednesday, she ws canning jelly and she had an acute onset of dizziness. She had eaten that day. She drank some orange juice. She started to stumble. She laid down on the sofa and called her son and he came over. She had to hold the wall and the room was spinning, she couldn't get up to go to the car, she called EMS and she was diagnosed with vertigo. Her pressure was elevated to 160 systolic but she was very anxious. She still couldn't get up afterwards, he legs were wobbly and feet were not moving, no nausea or vomiting, she ate a little food and felt better. By 10pm she felt better. Dr. Clarene Duke thought  It may have been a TIA. She also had a headache. The room was spinning, she couldn;t walk, no inciting events or illnesses. She was started on aspirin. She had bloodwork yesterday. This is completely new, never had it before. No personal history of strokes. Mother had strokes. No inciting events, no head trauma or illnesses or URI. She has allergies. She had eaten so not low glucose. Episode lasted 6 hours total. Getting made it worse, laying down was ok. If she moved it made it worse, she had to lay very still. No other associated symptoms, no facial drooping or problems with speech or focal weakness or sensory changes. Currently symptoms resolved. No chest pain, no palpitations, no SOB. She has had neck pain. No FHx strokes.   Reviewed notes, labs and imaging from outside physicians, which showed:   CT of the head, personally reviewed images and agree with  the following: FINDINGS: Ventricle size normal. Cerebral volume normal for age.  Negative for acute or chronic infarction. Negative for intracranial hemorrhage. No shift of the midline structures  Negative calvarium. Calcific density adjacent to the inner table left frontal lobe may be a calcified meningioma or benign exostosis.  IMPRESSION: No acute abnormality   Review of Systems: Patient complains of symptoms per HPI as well as the following symptoms: Spinning sensation, no chest pain, no shortness of breath, no fevers, no chills, no systemic signs. Pertinent negatives per HPI. All others negative.   Social History   Social History  . Marital Status: Married    Spouse Name: N/A  . Number of Children: N/A  . Years of Education: N/A   Occupational History  . Not on file.   Social History Main Topics  . Smoking status: Never Smoker   . Smokeless tobacco: Not on file  . Alcohol Use: No  . Drug Use: No  . Sexual Activity: Yes    Birth Control/ Protection: Surgical     Comment: 1st intercourse 58 yo-1 partner   Other Topics Concern  . Not on file   Social History Narrative    Family History  Problem Relation Age of Onset  . Heart disease Mother   . Seizures Mother   . Heart disease Brother     Past Medical History  Diagnosis Date  . IBD (inflammatory bowel disease)   . Arthritis   . Carpal tunnel syndrome   .  Osteopenia 12/2015    T score -1.9 FRAX 3.2%/ 0.2%  . Fibromyalgia   . Allergic rhinitis   . Pancreatitis, gallstone     Past Surgical History  Procedure Laterality Date  . Appendectomy    . Hand surgery      bilateral  . Knee arthroscopy      rt  . Neck surgery    . Tubal ligation    . 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 Vaginal hysterectomy bilateral salpingo-oophorectomy  11/1996    TVH BSO endometriosis/leiomyomata  . Pelvic laparoscopy  1992/1996    endometriosis  . Colposcopy    . Cholecystectomy N/A  08/17/2015    Procedure: LAPAROSCOPIC CHOLECYSTECTOMY WITH INTRAOPERATIVE CHOLANGIOGRAM;  Surgeon: Abigail Miyamotoouglas Blackman, MD;  Location: [facility] OR;  Service: General;  Laterality: N/A;    Current Outpatient Prescriptions  Medication Sig Dispense Refill  . Ascorbic Acid (VITAMIN C) 500 MG CAPS Take 500 capsules by mouth.    Marland Kitchen. BIOTIN W/ VITAMINS C & E PO Take 100 Units by mouth.    . Calcium Carbonate-Vitamin D (CALCIUM 600+D) 600-200 MG-UNIT TABS Take by mouth.    . cetirizine (ZYRTEC) 10 MG tablet Take 10 mg by mouth daily.    . clindamycin (CLEOCIN) 2 % vaginal cream Place 1 Applicatorful vaginally at bedtime. 40 g 0  . Cranberry 250 MG CAPS Take 250 mg by mouth daily.    . cyanocobalamin 100 MCG tablet Take 100 mcg by mouth daily.    . Flaxseed, Linseed, (FLAXSEED OIL) 1000 MG CAPS Take by mouth.    . fluticasone (FLONASE) 50 MCG/ACT nasal spray Place into both nostrils daily.    . Ginkgo Biloba 120 MG CAPS Take 120 mg by mouth daily.    . Glucosamine HCl 1500 MG TABS Take by mouth.    Marland Kitchen. ibuprofen (ADVIL,MOTRIN) 200 MG tablet Take 200 mg by mouth every 6 (six) hours as needed.    . loratadine (CLARITIN) 10 MG tablet Take 10 mg by mouth daily.    . Melatonin 3 MG TABS Take 3 mg by mouth at bedtime as needed.    . Multiple Vitamin (MULTIVITAMIN WITH MINERALS) TABS tablet Take 1 tablet by mouth daily.    . nortriptyline (PAMELOR) 25 MG capsule Take 1 capsule by mouth daily.    . polyethylene glycol (MIRALAX / GLYCOLAX) packet Take 17 g by mouth daily.    . ranitidine (ZANTAC) 150 MG tablet Take 150 mg by mouth daily as needed for heartburn.    Marland Kitchen. UNABLE TO FIND estroven otc    . vitamin E 100 UNIT capsule Take by mouth daily.     No current facility-administered medications for this visit.    Allergies as of 06/17/2016 - Review Complete 06/17/2016  Allergen Reaction Noted  . Sulfur Hives 07/29/2011  . Dexilant [dexlansoprazole] Rash 01/04/2016    Vitals: BP 146/82 mmHg  Pulse 78  Ht 5\' 4"   (1.626 m)  Wt 169 lb (76.658 kg)  BMI 28.99 kg/m2 Last Weight:  Wt Readings from Last 1 Encounters:  06/17/16 169 lb (76.658 kg)   Last Height:   Ht Readings from Last 1 Encounters:  06/17/16 5\' 4"  (1.626 m)    Physical exam: Exam: Gen: NAD, conversant, well nourised, overweight, well groomed                     CV: RRR, no MRG. No Carotid Bruits. No peripheral  edema, warm, nontender Eyes: Conjunctivae clear without exudates or hemorrhage  Neuro: Detailed Neurologic Exam  Speech:    Speech is normal; fluent and spontaneous with normal comprehension.  Cognition:    The patient is oriented to person, place, and time;     recent and remote memory intact;     language fluent;     normal attention, concentration,     fund of knowledge Cranial Nerves:    The pupils are equal, round, and reactive to light. The fundi are normal and spontaneous venous pulsations are present. Visual fields are full to finger confrontation. Extraocular movements are intact. Trigeminal sensation is intact and the muscles of mastication are normal. The face is symmetric. The palate elevates in the midline. Hearing intact. Voice is normal. Shoulder shrug is normal. The tongue has normal motion without fasciculations.   Coordination:    Normal finger to nose and heel to shin. Normal rapid alternating movements.   Gait:    Heel-toe and tandem gait are normal.   Motor Observation:    No asymmetry, no atrophy, and no involuntary movements noted. Tone:    Normal muscle tone.    Posture:    Posture is normal. normal erect    Strength:    Strength is V/V in the upper and lower limbs.      Sensation: intact to LT     Reflex Exam:  DTR's:    Deep tendon reflexes in the upper and lower extremities are normal bilaterally.   Toes:    The toes are downgoing bilaterally.   Clonus:    Clonus is absent.      Assessment/Plan:  65 year old with acute onset vertigo now resolved. No limb weakness, facial  droop, sensory changes or other associated symptoms. May have been BPPV  however need to evaluate for TIA. Neurologic exam is nonfocal.  MRI of the brain, MRA of the head. Carotid dopplers Continue the baby aspirin daily Need to request results of labs from pcp, had lipid panel completed yesterday, if LDL > 70 needs statin Last HgbA1c in 2016 6.6 will repeat  I had a long d/w patient about her recent possible TIA, risk for recurrent TIAs, personally independently reviewed imaging studies and stroke evaluation results and answered questions.Continue ASA for secondary stroke prevention and maintain strict control of hypertension with blood pressure goal below 130/90, diabetes with hemoglobin A1c goal below 6.5% and lipids with LDL cholesterol goal below 70 mg/dL. need to request records of recent LDL from primary care. I also advised the patient to eat a healthy diet with plenty of whole grains, cereals, fruits and vegetables, exercise regularly and maintain ideal body weight .Followup in the future with me in 3 months or call earlier if necessary.  Cc: Dr. Royston BakeLittle  Basem Yannuzzi, MD  [facility] [address] [place], [place] [id number]  Phone [phone] Fax [phone]

## 2016-06-17 NOTE — ED Notes (Signed)
  CBG 131  

## 2016-06-17 NOTE — H&P (Signed)
History and Physical    Rhonda Norris WUJ:[id number] DOB: [birth year] DOA: 06/17/2016   PCP: Mickie HillierLITTLE,KEVIN LORNE, MD Chief Complaint:  Chief Complaint  Patient presents with  . Code Stroke    HPI: Rhonda Norris is a 65 y.o. female who presents to the ED with c/o intermittent R sided face and arm numbness off and on over the past 2 days.  BP elevated today at 167/75.  No known PMH of HTN previously.  Saw neurologist as outpatient who put her on ASA 2181 which she took earlier today.  EMS called today for persistently reoccurring symptoms.  ED Course: Seen by neurology who recs admit and work up as well as ASA 325+plavix.  Review of Systems: As per HPI otherwise 10 point review of systems negative.    Past Medical History  Diagnosis Date  . IBD (inflammatory bowel disease)   . Arthritis   . Carpal tunnel syndrome   . Osteopenia 12/2015    T score -1.9 FRAX 3.2%/ 0.2%  . Fibromyalgia   . Allergic rhinitis   . Pancreatitis, gallstone     Past Surgical History  Procedure Laterality Date  . Appendectomy    . Hand surgery      bilateral  . Knee arthroscopy      rt  . Neck surgery    . Tubal ligation    . 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 Vaginal hysterectomy bilateral salpingo-oophorectomy  11/1996    TVH BSO endometriosis/leiomyomata  . Pelvic laparoscopy  1992/1996    endometriosis  . Colposcopy    . Cholecystectomy N/A 08/17/2015    Procedure: LAPAROSCOPIC CHOLECYSTECTOMY WITH INTRAOPERATIVE CHOLANGIOGRAM;  Surgeon: Abigail Miyamotoouglas Blackman, MD;  Location: [facility] OR;  Service: General;  Laterality: N/A;     reports that she has never smoked. She does not have any smokeless tobacco history on file. She reports that she does not drink alcohol or use illicit drugs.  Allergies  Allergen Reactions  . Sulfur Hives  . Sulfa Antibiotics Other (See Comments)    Headaches   . Dexilant [Dexlansoprazole] Rash    Family History  Problem Relation Age of  Onset  . Heart disease Mother   . Seizures Mother   . Heart disease Brother       Prior to Admission medications   Medication Sig Start Date End Date Taking? Authorizing Provider  ALPRAZolam (XANAX) 0.5 MG tablet Take 1 tablet 30 minutes- 1 hour before MRI. Do not drive. Can take an additional tablet. 06/17/16   Anson FretAntonia B Ahern, MD  Ascorbic Acid (VITAMIN C) 500 MG CAPS Take 500 capsules by mouth.    Historical Provider, MD  BIOTIN W/ VITAMINS C & E PO Take 100 Units by mouth.    Historical Provider, MD  Calcium Carbonate-Vitamin D (CALCIUM 600+D) 600-200 MG-UNIT TABS Take by mouth.    Historical Provider, MD  cetirizine (ZYRTEC) 10 MG tablet Take 10 mg by mouth daily.    Historical Provider, MD  clindamycin (CLEOCIN) 2 % vaginal cream Place 1 Applicatorful vaginally at bedtime. 01/04/16   Dara Lordsimothy P Fontaine, MD  Cranberry 250 MG CAPS Take 250 mg by mouth daily.    Historical Provider, MD  cyanocobalamin 100 MCG tablet Take 100 mcg by mouth daily.    Historical Provider, MD  Flaxseed, Linseed, (FLAXSEED OIL) 1000 MG CAPS Take by mouth.    Historical Provider, MD  fluticasone (FLONASE) 50 MCG/ACT nasal spray Place into  both nostrils daily.    Historical Provider, MD  Ginkgo Biloba 120 MG CAPS Take 120 mg by mouth daily.    Historical Provider, MD  Glucosamine HCl 1500 MG TABS Take by mouth.    Historical Provider, MD  ibuprofen (ADVIL,MOTRIN) 200 MG tablet Take 200 mg by mouth every 6 (six) hours as needed.    Historical Provider, MD  loratadine (CLARITIN) 10 MG tablet Take 10 mg by mouth daily.    Historical Provider, MD  Melatonin 3 MG TABS Take 3 mg by mouth at bedtime as needed.    Historical Provider, MD  Multiple Vitamin (MULTIVITAMIN WITH MINERALS) TABS tablet Take 1 tablet by mouth daily.    Historical Provider, MD  nortriptyline (PAMELOR) 25 MG capsule Take 1 capsule by mouth daily. 10/14/14   Historical Provider, MD  polyethylene glycol (MIRALAX / GLYCOLAX) packet Take 17 g by mouth  daily.    Historical Provider, MD  ranitidine (ZANTAC) 150 MG tablet Take 150 mg by mouth daily as needed for heartburn.    Historical Provider, MD  UNABLE TO FIND estroven otc    Historical Provider, MD  vitamin E 100 UNIT capsule Take by mouth daily.    Historical Provider, MD    Physical Exam: Filed Vitals:   06/17/16 1844 06/17/16 1847 06/17/16 1919 06/17/16 1921  BP:  167/72  149/82  Pulse:  86  84  Temp:  98.3 F (36.8 C)    TempSrc:  Oral    Resp:  13  14  Height:   (1.626 m)    Weight: 74.6 kg (164 lb 7.4 oz) 74.6 kg (164 lb 7.4 oz)    SpO2: 99% 99% 99% 99%      Constitutional: NAD, calm, comfortable Eyes: PERRL, lids and conjunctivae normal ENMT: Mucous membranes are moist. Posterior pharynx clear of any exudate or lesions.Normal dentition.  Neck: normal, supple, no masses, no thyromegaly Respiratory: clear to auscultation bilaterally, no wheezing, no crackles. Normal respiratory effort. No accessory muscle use.  Cardiovascular: Regular rate and rhythm, no murmurs / rubs / gallops. No extremity edema. 2+ pedal pulses. No carotid bruits.  Abdomen: no tenderness, no masses palpated. No hepatosplenomegaly. Bowel sounds positive.  Musculoskeletal: no clubbing / cyanosis. No joint deformity upper and lower extremities. Good ROM, no contractures. Normal muscle tone.  Skin: no rashes, lesions, ulcers. No induration Neurologic: CN 2-12 grossly intact. Sensation intact, DTR normal. Strength 5/5 in all 4.  Psychiatric: Normal judgment and insight. Alert and oriented x 3. Normal mood.    Labs on Admission: I have personally reviewed following labs and imaging studies  CBC:  Recent Labs Lab 06/17/16 1842  WBC 8.8  NEUTROABS 2.5  HGB 12.7  13.6  HCT 40.3  40.0  MCV 89.8  PLT 375   Basic Metabolic Panel:  Recent Labs Lab 06/17/16 1842  NA 137  138  K 4.2  4.2  CL 105  106  CO2 22  GLUCOSE 159*  161*  BUN 11  13  CREATININE 0.73  0.70  CALCIUM 9.1     GFR: Estimated Creatinine Clearance: 69.4 mL/min (by C-G formula based on Cr of 0.73). Liver Function Tests:  Recent Labs Lab 06/17/16 1842  AST 35  ALT 26  ALKPHOS 80  BILITOT 0.6  PROT 7.1  ALBUMIN 3.9   No results for input(s): LIPASE, AMYLASE in the last 168 hours. No results for input(s): AMMONIA in the last 168 hours. Coagulation Profile:  Recent Labs Lab 06/17/16  1842  INR 0.91   Cardiac Enzymes: No results for input(s): CKTOTAL, CKMB, CKMBINDEX, TROPONINI in the last 168 hours. BNP (last 3 results) No results for input(s): PROBNP in the last 8760 hours. HbA1C: No results for input(s): HGBA1C in the last 72 hours. CBG:  Recent Labs Lab 06/17/16 1846  GLUCAP 131*   Lipid Profile: No results for input(s): CHOL, HDL, LDLCALC, TRIG, CHOLHDL, LDLDIRECT in the last 72 hours. Thyroid Function Tests: No results for input(s): TSH, T4TOTAL, FREET4, T3FREE, THYROIDAB in the last 72 hours. Anemia Panel: No results for input(s): VITAMINB12, FOLATE, FERRITIN, TIBC, IRON, RETICCTPCT in the last 72 hours. Urine analysis:    Component Value Date/Time   COLORURINE YELLOW 01/04/2016 1219   APPEARANCEUR CLEAR 01/04/2016 1219   LABSPEC <1.005 01/04/2016 1219   PHURINE 5.0 01/04/2016 1219   GLUCOSEU NEGATIVE 01/04/2016 1219   HGBUR NEGATIVE 01/04/2016 1219   BILIRUBINUR NEGATIVE 01/04/2016 1219   KETONESUR NEGATIVE 01/04/2016 1219   PROTEINUR NEGATIVE 01/04/2016 1219   UROBILINOGEN 0.2 08/15/2015 0739   NITRITE NEGATIVE 01/04/2016 1219   LEUKOCYTESUR NEGATIVE 01/04/2016 1219   Sepsis Labs: @LABRCNTIP (procalcitonin:4,lacticidven:4) )No results found for this or any previous visit (from the past 240 hour(s)).   Radiological Exams on Admission: Ct Head Code Stroke W/o Cm  06/17/2016  CLINICAL DATA:  Code stroke.  Difficulty speaking EXAM: CT HEAD WITHOUT CONTRAST TECHNIQUE: Contiguous axial images were obtained from the base of the skull through the vertex without  intravenous contrast. COMPARISON:  None. FINDINGS: Ventricle size normal.  Cerebral volume normal for age. Negative for acute or chronic infarction. Negative for intracranial hemorrhage. No shift of the midline structures Negative calvarium. Calcific density adjacent to the inner table left frontal lobe may be a calcified meningioma or benign exostosis. IMPRESSION: No acute abnormality. These results were called by telephone at the time of interpretation on 06/17/2016 at 6:52 pm to Dr. Fredric DineEshragi, who verbally acknowledged these results. Electronically Signed   By: Marlan Palauharles  Clark M.D.   On: 06/17/2016 18:51    EKG: Independently reviewed.  Assessment/Plan Active Problems:   TIA (transient ischemic attack)   TIA -  Stroke pathway  MRI pending  PT/OT  ASA 325 and Plavix 75 daily ordered   DVT prophylaxis: Lovenox Code Status: Full Family Communication: Family at bedside Consults called: neuro has already seen patient Admission status: Admit to obs   Kerstyn Coryell, Heywood IlesJARED M. DO [facility] Pager [phone] from 7PM-7AM  If 7AM-7PM, please contact the day physician for the patient [email or web address] Password [email or web address]  06/17/2016, 7:56 PM

## 2016-06-17 NOTE — ED Notes (Signed)
Pt. Coming from home via [facility] for right arm tingling. Pt. Seen at neurologist this morning for similar symptoms since Wednesday. Pt. Had sudden onset at 1820 of right facial droop and difficulty speaking. Pt. Denies thinners, but does report taking 1 81mg  ASA today. Stroke team at bedside.

## 2016-06-17 NOTE — ED Notes (Signed)
Pt. Symptoms have seemed to resolved. Pt. Wheeled to restroom with nurse tech and was able to independently use restroom.

## 2016-06-17 NOTE — Patient Instructions (Signed)
Overall you are doing fairly well but I do want to suggest a few things today:   Remember to drink plenty of fluid, eat healthy meals and do not skip any meals. Try to eat protein with a every meal and eat a healthy snack such as fruit or nuts in between meals. Try to keep a regular sleep-wake schedule and try to exercise daily, particularly in the form of walking, 20-30 minutes a day, if you can.   As far as your medications are concerned, I would like to suggest: Continue Aspirin 81mg . Will request ldl and may need a statin  As far as diagnostic testing: MRI of the brain, MRA of the head, labs, carotid dopplers  I would like to see you back in 3 months, sooner if we need to. Please call us with any interim questions, concerns, problems, updates or refill requests.   Please also call us for any test results so we can go over those with you on the phone.  My clinical assistant and will answer any of your questions and relay your messages to me and also relay most of my messages to you.   Our phone number is [phone]. We also have an after hours call service for urgent matters and there is a physician on-call for urgent questions. For any emergencies you know to call 911 or go to the nearest emergency room  Alprazolam tablets What is this medicine? ALPRAZOLAM (al PRAY zoe lam) is a benzodiazepine. It is used to treat anxiety and panic attacks. This medicine may be used for other purposes; ask your health care provider or pharmacist if you have questions. What should I tell my health care provider before I take this medicine? They need to know if you have any of these conditions: -an alcohol or drug abuse problem -bipolar disorder, depression, psychosis or other mental health conditions -glaucoma -kidney or liver disease -lung or breathing disease -myasthenia gravis -Parkinson's disease -porphyria -seizures or a history of seizures -suicidal thoughts -an unusual or allergic reaction  to alprazolam, other benzodiazepines, foods, dyes, or preservatives -pregnant or trying to get pregnant -breast-feeding How should I use this medicine? Take this medicine by mouth with a glass of water. Follow the directions on the prescription label. Take your medicine at regular intervals. Do not take it more often than directed. If you have been taking this medicine regularly for some time, do not suddenly stop taking it. You must gradually reduce the dose or you may get severe side effects. Ask your doctor or health care professional for advice. Even after you stop taking this medicine it can still affect your body for several days. Talk to your pediatrician regarding the use of this medicine in children. Special care may be needed. Overdosage: If you think you have taken too much of this medicine contact a poison control center or emergency room at once. NOTE: This medicine is only for you. Do not share this medicine with others. What if I miss a dose? If you miss a dose, take it as soon as you can. If it is almost time for your next dose, take only that dose. Do not take double or extra doses. What may interact with this medicine? Do not take this medicine with any of the following medications: -certain medicines for HIV infection or AIDS -ketoconazole -itraconazole This medicine may also interact with the following medications: -birth control pills -certain macrolide antibiotics like clarithromycin, erythromycin, troleandomycin -cimetidine -cyclosporine -ergotamine -grapefruit juice -herbal or dietary  supplements like kava kava, melatonin, dehydroepiandrosterone, DHEA, St. John's Wort or valerian -imatinib, STI-571 -isoniazid -levodopa -medicines for depression, anxiety, or psychotic disturbances -prescription pain medicines -rifampin, rifapentine, or rifabutin -some medicines for blood pressure or heart problems -some medicines for seizures like carbamazepine, oxcarbazepine,  phenobarbital, phenytoin, primidone This list may not describe all possible interactions. Give your health care provider a list of all the medicines, herbs, non-prescription drugs, or dietary supplements you use. Also tell them if you smoke, drink alcohol, or use illegal drugs. Some items may interact with your medicine. What should I watch for while using this medicine? Visit your doctor or health care professional for regular checks on your progress. Your body can become dependent on this medicine. Ask your doctor or health care professional if you still need to take it. You may get drowsy or dizzy. Do not drive, use machinery, or do anything that needs mental alertness until you know how this medicine affects you. To reduce the risk of dizzy and fainting spells, do not stand or sit up quickly, especially if you are an older patient. Alcohol may increase dizziness and drowsiness. Avoid alcoholic drinks. Do not treat yourself for coughs, colds or allergies without asking your doctor or health care professional for advice. Some ingredients can increase possible side effects. What side effects may I notice from receiving this medicine? Side effects that you should report to your doctor or health care professional as soon as possible: -allergic reactions like skin rash, itching or hives, swelling of the face, lips, or tongue -confusion, forgetfulness -depression -difficulty sleeping -difficulty speaking -feeling faint or lightheaded, falls -mood changes, excitability or aggressive behavior -muscle cramps -trouble passing urine or change in the amount of urine -unusually weak or tired Side effects that usually do not require medical attention (report to your doctor or health care professional if they continue or are bothersome): -change in sex drive or performance -changes in appetite This list may not describe all possible side effects. Call your doctor for medical advice about side effects. You  may report side effects to FDA at 1-800-FDA-1088. Where should I keep my medicine? Keep out of the reach of children. This medicine can be abused. Keep your medicine in a safe place to protect it from theft. Do not share this medicine with anyone. Selling or giving away this medicine is dangerous and against the law. Store at room temperature between 20 and 25 degrees C (68 and 77 degrees F). This medicine may cause accidental overdose and death if taken by other adults, children, or pets. Mix any unused medicine with a substance like cat litter or coffee grounds. Then throw the medicine away in a sealed container like a sealed bag or a coffee can with a lid. Do not use the medicine after the expiration date. NOTE: This sheet is a summary. It may not cover all possible information. If you have questions about this medicine, talk to your doctor, pharmacist, or health care provider.    2016, [employer]. (2014-08-26 14:51:36)

## 2016-06-17 NOTE — ED Provider Notes (Signed)
CSN: [id number]     Arrival date & time 06/17/16  1832 History   First MD Initiated Contact with Patient 06/17/16 1834     Chief Complaint  Patient presents with  . Code Stroke    An emergency department physician performed an initial assessment on this suspected stroke patient at 6. (Consider location/radiation/quality/duration/timing/severity/associated sxs/prior Treatment) HPI Comments: Patient presents to the ED with a chief complaint of facial numbness, difficulty speaking, and right upper and lower extremity numbness.  Patient was being seen by her neurologist after being referred for dizziness by her PCP.  She was brought by EMS and code stroke activated based on waxing and waning symptoms.  Patient denies any additional symptoms.  Denies any pain.  States that she still has some numbness sensation in her right hand and foot.  The history is provided by the patient. No language interpreter was used.    Past Medical History  Diagnosis Date  . IBD (inflammatory bowel disease)   . Arthritis   . Carpal tunnel syndrome   . Osteopenia 12/2015    T score -1.9 FRAX 3.2%/ 0.2%  . Fibromyalgia   . Allergic rhinitis   . Pancreatitis, gallstone    Past Surgical History  Procedure Laterality Date  . Appendectomy    . Hand surgery      bilateral  . Knee arthroscopy      rt  . Neck surgery    . Tubal ligation    . 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 Vaginal hysterectomy bilateral salpingo-oophorectomy  11/1996    [facility] BSO endometriosis/leiomyomata  . Pelvic laparoscopy  1992/1996    endometriosis  . Colposcopy    . Cholecystectomy N/A 08/17/2015    Procedure: LAPAROSCOPIC CHOLECYSTECTOMY WITH INTRAOPERATIVE CHOLANGIOGRAM;  Surgeon: Abigail Miyamoto, MD;  Location: [facility] OR;  Service: General;  Laterality: N/A;   Family History  Problem Relation Age of Onset  . Heart disease Mother   . Seizures Mother   . Heart disease Brother    Social History   Substance Use Topics  . Smoking status: Never Smoker   . Smokeless tobacco: None  . Alcohol Use: No   OB History    Gravida Para Term Preterm AB TAB SAB Ectopic Multiple Living   Review of Systems  Constitutional: Negative for fever and chills.  Respiratory: Negative for shortness of breath.   Cardiovascular: Negative for chest pain.  Gastrointestinal: Negative for nausea, vomiting, diarrhea and constipation.  Genitourinary: Negative for dysuria.  Neurological: Positive for numbness.  All other systems reviewed and are negative.     Allergies  Sulfur and Dexilant  Home Medications   Prior to Admission medications   Medication Sig Start Date End Date Taking? Authorizing Provider  ALPRAZolam (XANAX) 0.5 MG tablet Take 1 tablet 30 minutes- 1 hour before MRI. Do not drive. Can take an additional tablet. 06/17/16   Anson Fret, MD  Ascorbic Acid (VITAMIN C) 500 MG CAPS Take 500 capsules by mouth.    Historical Provider, MD  BIOTIN W/ VITAMINS C & E PO Take 100 Units by mouth.    Historical Provider, MD  Calcium Carbonate-Vitamin D (CALCIUM 600+D) 600-200 MG-UNIT TABS Take by mouth.    Historical Provider, MD  cetirizine (ZYRTEC) 10 MG tablet Take 10 mg by mouth daily.    Historical Provider, MD  clindamycin (CLEOCIN) 2 %  vaginal cream Place 1 Applicatorful vaginally at bedtime. 01/04/16   Dara Lordsimothy P Fontaine, MD  Cranberry 250 MG CAPS Take 250 mg by mouth daily.    Historical Provider, MD  cyanocobalamin 100 MCG tablet Take 100 mcg by mouth daily.    Historical Provider, MD  Flaxseed, Linseed, (FLAXSEED OIL) 1000 MG CAPS Take by mouth.    Historical Provider, MD  fluticasone (FLONASE) 50 MCG/ACT nasal spray Place into both nostrils daily.    Historical Provider, MD  Ginkgo Biloba 120 MG CAPS Take 120 mg by mouth daily.    Historical Provider, MD  Glucosamine HCl 1500 MG TABS Take by mouth.    Historical Provider, MD  ibuprofen (ADVIL,MOTRIN) 200 MG tablet Take  200 mg by mouth every 6 (six) hours as needed.    Historical Provider, MD  loratadine (CLARITIN) 10 MG tablet Take 10 mg by mouth daily.    Historical Provider, MD  Melatonin 3 MG TABS Take 3 mg by mouth at bedtime as needed.    Historical Provider, MD  Multiple Vitamin (MULTIVITAMIN WITH MINERALS) TABS tablet Take 1 tablet by mouth daily.    Historical Provider, MD  nortriptyline (PAMELOR) 25 MG capsule Take 1 capsule by mouth daily. 10/14/14   Historical Provider, MD  polyethylene glycol (MIRALAX / GLYCOLAX) packet Take 17 g by mouth daily.    Historical Provider, MD  ranitidine (ZANTAC) 150 MG tablet Take 150 mg by mouth daily as needed for heartburn.    Historical Provider, MD  UNABLE TO FIND estroven otc    Historical Provider, MD  vitamin E 100 UNIT capsule Take by mouth daily.    Historical Provider, MD   BP 167/72 mmHg  Pulse 86  Temp(Src) 98.3 F (36.8 C) (Oral)  Resp 13  Ht 5\' 4"  (1.626 m)  Wt 74.6 kg  BMI 28.22 kg/m2  SpO2 99% Physical Exam  Constitutional: She is oriented to person, place, and time. She appears well-developed and well-nourished.  HENT:  Head: Normocephalic and atraumatic.  Eyes: Conjunctivae and EOM are normal. Pupils are equal, round, and reactive to light.  Neck: Normal range of motion. Neck supple.  Cardiovascular: Normal rate and regular rhythm.  Exam reveals no gallop and no friction rub.   No murmur heard. Pulmonary/Chest: Effort normal and breath sounds normal. No respiratory distress. She has no wheezes. She has no rales. She exhibits no tenderness.  Abdominal: Soft. Bowel sounds are normal. She exhibits no distension and no mass. There is no tenderness. There is no rebound and no guarding.  Musculoskeletal: Normal range of motion. She exhibits no edema or tenderness.  Neurological: She is alert and oriented to person, place, and time.  Subjective numbness in right upper and lower extremity Speech is clear Movements goal oriented  Skin: Skin is  warm and dry.  Psychiatric: She has a normal mood and affect. Her behavior is normal. Judgment and thought content normal.  Nursing note and vitals reviewed.   ED Course  Procedures (including critical care time) Labs Review Labs Reviewed  DIFFERENTIAL - Abnormal; Notable for the following:    Lymphs Abs 5.3 (*)    All other components within normal limits  CBG MONITORING, ED - Abnormal; Notable for the following:    Glucose-Capillary 131 (*)    All other components within normal limits  I-STAT CHEM 8, ED - Abnormal; Notable for the following:    Glucose, Bld 161 (*)    Calcium, Ion 0.96 (*)    All other  components within normal limits  PROTIME-INR  APTT  CBC  COMPREHENSIVE METABOLIC PANEL  I-STAT TROPOININ, ED    Imaging Review Ct Head Code Stroke W/o Cm  06/17/2016  CLINICAL DATA:  Code stroke.  Difficulty speaking EXAM: CT HEAD WITHOUT CONTRAST TECHNIQUE: Contiguous axial images were obtained from the base of the skull through the vertex without intravenous contrast. COMPARISON:  None. FINDINGS: Ventricle size normal.  Cerebral volume normal for age. Negative for acute or chronic infarction. Negative for intracranial hemorrhage. No shift of the midline structures Negative calvarium. Calcific density adjacent to the inner table left frontal lobe may be a calcified meningioma or benign exostosis. IMPRESSION: No acute abnormality. These results were called by telephone at the time of interpretation on 06/17/2016 at 6:52 pm to Dr. Fredric DineEshragi, who verbally acknowledged these results. Electronically Signed   By: Marlan Palauharles  Clark M.D.   On: 06/17/2016 18:51   I have personally reviewed and evaluated these images and lab results as part of my medical decision-making.   EKG Interpretation   Date/Time:  Friday June 17 2016 18:51:43 EDT Ventricular Rate:  86 PR Interval:    QRS Duration: 100 QT Interval:  381 QTC Calculation: 456 R Axis:   52 Text Interpretation:  Sinus rhythm Biatrial  enlargement No acute changes  No significant change since last tracing Confirmed by NANAVATI, MD, Janey GentaANKIT  [phone]) on 06/17/2016 7:45:10 PM      MDM   Final diagnoses:  Transient cerebral ischemia, unspecified transient cerebral ischemia type    Patient seen as code stroke in conjunction with neurology.  Per neurology not a candidate for tpa or interventional based on intermittent symptoms.  Neurology recommends TIA workup and admission to medicine.  Discussed with Dr. Rhunette CroftNanavati.  Appreciate Dr. Julian ReilGardner for admitting the patient.    Roxy Horsemanobert Meshawn Oconnor, PA-C 06/17/16 2049  Derwood KaplanAnkit Nanavati, MD 06/18/16 [phone]

## 2016-06-17 NOTE — ED Notes (Signed)
Patient transported to MRI 

## 2016-06-18 DIAGNOSIS — E785 Hyperlipidemia, unspecified: Secondary | ICD-10-CM | POA: Diagnosis not present

## 2016-06-18 DIAGNOSIS — K219 Gastro-esophageal reflux disease without esophagitis: Secondary | ICD-10-CM | POA: Diagnosis not present

## 2016-06-18 DIAGNOSIS — G458 Other transient cerebral ischemic attacks and related syndromes: Secondary | ICD-10-CM | POA: Diagnosis not present

## 2016-06-18 DIAGNOSIS — R208 Other disturbances of skin sensation: Secondary | ICD-10-CM

## 2016-06-18 DIAGNOSIS — R202 Paresthesia of skin: Secondary | ICD-10-CM

## 2016-06-18 DIAGNOSIS — R200 Anesthesia of skin: Secondary | ICD-10-CM | POA: Diagnosis not present

## 2016-06-18 DIAGNOSIS — G459 Transient cerebral ischemic attack, unspecified: Secondary | ICD-10-CM | POA: Diagnosis not present

## 2016-06-18 LAB — LIPID PANEL
CHOL/HDL RATIO: 3.4 ratio
Cholesterol: 175 mg/dL (ref 0–200)
HDL: 51 mg/dL (ref >40)
LDL CALC: 100 mg/dL — AB (ref 0–99)
Triglycerides: 120 mg/dL (ref <150)
VLDL: 24 mg/dL (ref 0–40)

## 2016-06-18 LAB — HEMOGLOBIN A1C
Est. average glucose Bld gHb Est-mCnc: 146 mg/dL
Hgb A1c MFr Bld: 6.7 % — ABNORMAL HIGH (ref 4.8–5.6)

## 2016-06-18 MED ORDER — SIMVASTATIN 10 MG PO TABS
10.0000 mg | ORAL_TABLET | Freq: Every day | ORAL | Status: DC
  Administered 2016-06-18 – 2016-06-20 (×3): 10 mg via ORAL
  Filled 2016-06-18 (×3): qty 1

## 2016-06-18 NOTE — Progress Notes (Signed)
PROGRESS NOTE    Rhonda Norris  ZOX:[id number] DOB: 01-26-1951 DOA: 06/17/2016 PCP: Mickie HillierLITTLE,KEVIN LORNE, MD    Brief Narrative:  Rhonda Norris is a 65 y.o. female who presents to the ED with c/o intermittent R sided face and arm numbness off and on over the past 2 days. BP elevated today at 167/75. No known PMH of HTN previously. Saw neurologist as outpatient who put her on ASA 3081 which she took earlier today. EMS called today for persistently reoccurring symptoms.  ED Course: Seen by neurology who recs admit and work up as well as ASA 325+plavix.  Assessment & Plan:   Active Problems:   TIA (transient ischemic attack)   Right facial numbness   Numbness and tingling of right upper extremity   Hyperlipidemia  #1 right facial numbness/right upper extremity numbness and tingling/?? Recurrent TIAs Differential of patient's symptoms include recurrent TIAs versus complex migraines versus seizure disorder. Patient denies any headaches. MRI/MRA of the head done was negative for any acute infarct. Patient with resolution of deficits on presentation to the ED and assessed TPA was not administered. Stroke workup underway including carotid Dopplers and 2-D echo. Fasting lipid panel with LDL of 100. Hemoglobin A1c was 6.7. EEG has been ordered and is pending. Will start on a small dose statin. Aspirin has been changed to Plavix per neurology recommendations. Neurology following and appreciate input and recommendations.  #2 hyperlipidemia LDL of 100. Will start a small dose statin.  #3 elevated blood pressure Improved. Follow.   DVT prophylaxis: Lovenox Code Status: Full Family Communication: Updated patient and family at bedside. Disposition Plan: Home once workup completed and patient clinically improved.   Consultants:    Neurology: Dr.Eshraghi  Procedures:   MRI brain MRI head 06/17/2016  Chest x-ray 06/17/2016  Antimicrobials:   None   Subjective: Patient states  right facial and upper extremity numbness and tingling improved. No chest pain. No shortness of breath.  OgbjectiveCeasar Mons: Filed Vitals:   06/18/16 0357 06/18/16 0611 06/18/16 0838 06/18/16 1538  BP: 128/85 126/73 128/72 136/73  Pulse: 79 71 70 81  Temp: 98 F (36.7 C) 98 F (36.7 C) 97.6 F (36.4 C) 98.3 F (36.8 C)  TempSrc: Oral Oral Oral Oral  Resp: 16 18 12 17   Height:      Weight:      SpO2: 100% 97% 96% 98%    Intake/Output Summary (Last 24 hours) at 06/18/16 1705 Last data filed at 06/18/16 0926  Gross per 24 hour  Intake    200 ml  Output      0 ml  Net    200 ml   Filed Weights   06/17/16 1844 06/17/16 1847  Weight: 74.6 kg (164 lb 7.4 oz) 74.6 kg (164 lb 7.4 oz)    Examination:  General exam: Appears calm and comfortable  Respiratory system: Clear to auscultation. Respiratory effort normal. Cardiovascular system: S1 & S2 heard, RRR. No JVD, murmurs, rubs, gallops or clicks. No pedal edema. Gastrointestinal system: Abdomen is nondistended, soft and nontender. No organomegaly or masses felt. Normal bowel sounds heard. Central nervous system: Alert and oriented. No focal neurological deficits. Extremities: Symmetric 5 x 5 power. Skin: No rashes, lesions or ulcers Psychiatry: Judgement and insight appear normal. Mood & affect appropriate.     Data Reviewed: I have personally reviewed following labs and imaging studies  CBC:  Recent Labs Lab 06/17/16 1842  WBC 8.8  NEUTROABS 2.5  HGB 12.7  13.6  HCT 40.3  40.0  MCV 89.8  PLT 375   Basic Metabolic Panel:  Recent Labs Lab 06/17/16 1842  NA 137  138  K 4.2  4.2  CL 105  106  CO2 22  GLUCOSE 159*  161*  BUN 11  13  CREATININE 0.73  0.70  CALCIUM 9.1   GFR: Estimated Creatinine Clearance: 69.4 mL/min (by C-G formula based on Cr of 0.73). Liver Function Tests:  Recent Labs Lab 06/17/16 1842  AST 35  ALT 26  ALKPHOS 80  BILITOT 0.6  PROT 7.1  ALBUMIN 3.9   No results for input(s):  LIPASE, AMYLASE in the last 168 hours. No results for input(s): AMMONIA in the last 168 hours. Coagulation Profile:  Recent Labs Lab 06/17/16 1842  INR 0.91   Cardiac Enzymes: No results for input(s): CKTOTAL, CKMB, CKMBINDEX, TROPONINI in the last 168 hours. BNP (last 3 results) No results for input(s): PROBNP in the last 8760 hours. HbA1C:  Recent Labs  06/17/16 1117  HGBA1C 6.7*   CBG:  Recent Labs Lab 06/17/16 1846  GLUCAP 131*   Lipid Profile:  Recent Labs  06/18/16 0447  CHOL 175  HDL 51  LDLCALC 100*  TRIG 120  CHOLHDL 3.4   Thyroid Function Tests: No results for input(s): TSH, T4TOTAL, FREET4, T3FREE, THYROIDAB in the last 72 hours. Anemia Panel: No results for input(s): VITAMINB12, FOLATE, FERRITIN, TIBC, IRON, RETICCTPCT in the last 72 hours. Sepsis Labs: No results for input(s): PROCALCITON, LATICACIDVEN in the last 168 hours.  No results found for this or any previous visit (from the past 240 hour(s)).       Radiology Studies: Dg Chest 2 View  06/17/2016  CLINICAL DATA:  Right-sided weakness 3 days. EXAM: CHEST  2 VIEW COMPARISON:  None. FINDINGS: Lungs are somewhat hypoinflated without focal consolidation or effusion. Cardiomediastinal silhouette is within normal. There is mild calcified plaque over the aortic arch. There mild degenerate changes of the spine. Anterior fusion hardware partially visualized over the cervical spine. IMPRESSION: Hypoinflation without acute cardiopulmonary disease. Electronically Signed   By: Elberta Fortis M.D.   On: 06/17/2016 20:18   Mr Brain Wo Contrast  06/17/2016  CLINICAL DATA:  64 year old female with intermittent right-sided facial and arm numbness off and on for the past 2 days. Elevated blood pressure. Subsequent encounter. EXAM: MRI HEAD WITHOUT CONTRAST MRA HEAD WITHOUT CONTRAST TECHNIQUE: Multiplanar, multiecho pulse sequences of the brain and surrounding structures were obtained without intravenous  contrast. Angiographic images of the head were obtained using MRA technique without contrast. COMPARISON:  06/17/2016 head CT.  No comparison MR. FINDINGS: MRI HEAD FINDINGS No acute infarct or intracranial hemorrhage. Minimal chronic microvascular changes. No hydrocephalus. Anterior left frontal 1.2 cm calcification may represent a small meningioma without surrounding vasogenic edema. Major intracranial vascular structures are patent. Cervical spondylotic changes with narrowing ventral thecal sac C2-3 and C3-4 with minimal cord flattening C3-4 level. Cervical medullary junction unremarkable. Prominent perivascular spaces incidentally noted. No acute orbital abnormality. MRA HEAD FINDINGS Anterior circulation without medium or large size vessel significant stenosis or occlusion. Slightly prominent infundibulum without aneurysm noted. Right vertebral artery is dominant. Slight narrowing distal left vertebral artery. Slight irregularity of the basilar artery without significant narrowing. Poor delineation of the posterior inferior cerebellar arteries. Only small portion of the anterior inferior cerebellar arteries are visualized. Slight irregularity of portions of the posterior cerebral arteries without high-grade stenosis. IMPRESSION: MRI HEAD No acute infarct or intracranial hemorrhage. Minimal chronic microvascular changes. Anterior left frontal  1.2 cm calcification may represent a small meningioma without surrounding vasogenic edema. Cervical spondylotic changes with narrowing ventral thecal sac C2-3 and C3-4 with minimal cord flattening C3-4 level. MRA HEAD Anterior circulation without medium or large size vessel significant stenosis or occlusion. Slight narrowing distal left vertebral artery. Slight irregularity of the basilar artery without significant narrowing. Poor delineation of the posterior inferior cerebellar arteries. Only small portion of the anterior inferior cerebellar arteries are visualized.  Electronically Signed   By: Lacy Duverney M.D.   On: 06/17/2016 22:18   Mr Maxine Glenn Head/brain Wo Cm  06/17/2016  CLINICAL DATA:  65 year old female with intermittent right-sided facial and arm numbness off and on for the past 2 days. Elevated blood pressure. Subsequent encounter. EXAM: MRI HEAD WITHOUT CONTRAST MRA HEAD WITHOUT CONTRAST TECHNIQUE: Multiplanar, multiecho pulse sequences of the brain and surrounding structures were obtained without intravenous contrast. Angiographic images of the head were obtained using MRA technique without contrast. COMPARISON:  06/17/2016 head CT.  No comparison MR. FINDINGS: MRI HEAD FINDINGS No acute infarct or intracranial hemorrhage. Minimal chronic microvascular changes. No hydrocephalus. Anterior left frontal 1.2 cm calcification may represent a small meningioma without surrounding vasogenic edema. Major intracranial vascular structures are patent. Cervical spondylotic changes with narrowing ventral thecal sac C2-3 and C3-4 with minimal cord flattening C3-4 level. Cervical medullary junction unremarkable. Prominent perivascular spaces incidentally noted. No acute orbital abnormality. MRA HEAD FINDINGS Anterior circulation without medium or large size vessel significant stenosis or occlusion. Slightly prominent infundibulum without aneurysm noted. Right vertebral artery is dominant. Slight narrowing distal left vertebral artery. Slight irregularity of the basilar artery without significant narrowing. Poor delineation of the posterior inferior cerebellar arteries. Only small portion of the anterior inferior cerebellar arteries are visualized. Slight irregularity of portions of the posterior cerebral arteries without high-grade stenosis. IMPRESSION: MRI HEAD No acute infarct or intracranial hemorrhage. Minimal chronic microvascular changes. Anterior left frontal 1.2 cm calcification may represent a small meningioma without surrounding vasogenic edema. Cervical spondylotic  changes with narrowing ventral thecal sac C2-3 and C3-4 with minimal cord flattening C3-4 level. MRA HEAD Anterior circulation without medium or large size vessel significant stenosis or occlusion. Slight narrowing distal left vertebral artery. Slight irregularity of the basilar artery without significant narrowing. Poor delineation of the posterior inferior cerebellar arteries. Only small portion of the anterior inferior cerebellar arteries are visualized. Electronically Signed   By: Lacy Duverney M.D.   On: 06/17/2016 22:18   Ct Head Code Stroke W/o Cm  06/17/2016  CLINICAL DATA:  Code stroke.  Difficulty speaking EXAM: CT HEAD WITHOUT CONTRAST TECHNIQUE: Contiguous axial images were obtained from the base of the skull through the vertex without intravenous contrast. COMPARISON:  None. FINDINGS: Ventricle size normal.  Cerebral volume normal for age. Negative for acute or chronic infarction. Negative for intracranial hemorrhage. No shift of the midline structures Negative calvarium. Calcific density adjacent to the inner table left frontal lobe may be a calcified meningioma or benign exostosis. IMPRESSION: No acute abnormality. These results were called by telephone at the time of interpretation on 06/17/2016 at 6:52 pm to Dr. Fredric Dine, who verbally acknowledged these results. Electronically Signed   By: Marlan Palau M.D.   On: 06/17/2016 18:51        Scheduled Meds: . clopidogrel  75 mg Oral Daily  . enoxaparin (LOVENOX) injection  40 mg Subcutaneous Q24H  . famotidine  20 mg Oral Daily  . multivitamin with minerals  1 tablet Oral Daily  . nortriptyline  25 mg Oral QHS  . polyethylene glycol  17 g Oral Daily  . simvastatin  10 mg Oral q1800   Continuous Infusions:       Time spent: 35 minutes    Rhonda Cafaro, MD [facility] Pager [phone]  If 7PM-7AM, please contact night-coverage [email or web address] Password [email or web address] 06/18/2016, 5:05 PM

## 2016-06-18 NOTE — Progress Notes (Signed)
Occupational Therapy Evaluation Patient Details Name: Rhonda Norris MRN: [id number] DOB: 1951-11-02 Today's Date: 06/18/2016    History of Present Illness 65 y.o. female admitted for Possible recurrent TIAs..   Clinical Impression   Pt appears back to baseline regarding ADL and mobility. Educated pt on signs/symptoms of CVA using FAST. Pt verbalized understanding. Pt safe to D/C home when medically stable.     Follow Up Recommendations  No OT follow up;Supervision - Intermittent    Equipment Recommendations  None recommended by OT    Recommendations for Other Services       Precautions / Restrictions Precautions Precautions: None Restrictions Weight Bearing Restrictions: No      Mobility Bed Mobility Overal bed mobility: Independent                Transfers Overall transfer level: Modified independent                    Balance Overall balance assessment: No apparent balance deficits (not formally assessed) (able to retrieve items from floor)                                          ADL Overall ADL's : At baseline                                             Vision Vision Assessment?: No apparent visual deficits Additional Comments: Pt staes vision was blurry when "it was happening, but appers normal now"   Perception     Praxis Praxis Praxis tested?: Within functional limits    Pertinent Vitals/Pain Pain Assessment: No/denies pain     Hand Dominance Right   Extremity/Trunk Assessment Upper Extremity Assessment Upper Extremity Assessment: Overall WFL for tasks assessed   Lower Extremity Assessment Lower Extremity Assessment: Overall WFL for tasks assessed   Cervical / Trunk Assessment Cervical / Trunk Assessment: Normal   Communication Communication Communication: No difficulties   Cognition Arousal/Alertness: Awake/alert Behavior During Therapy: WFL for tasks assessed/performed Overall  Cognitive Status: Within Functional Limits for tasks assessed                     General Comments       Exercises       Shoulder Instructions      Home Living Family/patient expects to be discharged to:: Private residence Living Arrangements: Spouse/significant other;Children Available Help at Discharge: Family;Available 24 hours/day Type of Home: House Home Access: Stairs to enter Entergy CorporationEntrance Stairs-Number of Steps: 4 Entrance Stairs-Rails: Right Home Layout: Multi-level Alternate Level Stairs-Number of Steps: 12 Alternate Level Stairs-Rails: Left Bathroom Shower/Tub: Walk-in shower;Tub/shower unit   Teacher, early years/preBathroom Toilet: Standard Bathroom Accessibility: Yes How Accessible: Accessible via walker Home Equipment: None          Prior Functioning/Environment Level of Independence: Independent             OT Diagnosis: Generalized weakness   OT Problem List: Decreased knowledge of precautions   OT Treatment/Interventions:      OT Goals(Current goals can be found in the care plan section) Acute Rehab OT Goals Patient Stated Goal: to find out whats causing problems OT Goal Formulation: All assessment and education complete, DC therapy  OT Frequency:     Barriers to D/C:  Co-evaluation              End of Session Nurse Communication: Mobility status  Activity Tolerance: Patient tolerated treatment well Patient left: in bed;with call bell/phone within reach;with bed alarm set   Time: [id number] OT Time Calculation (min): 19 min Charges:  OT General Charges $OT Visit: 1 Procedure OT Evaluation $OT Eval Low Complexity: 1 Procedure G-Codes: OT G-codes **NOT FOR INPATIENT CLASS** Functional Assessment Tool Used: clinical judgement Functional Limitation: Self care Self Care Current Status (V4098(G8987): 0 percent impaired, limited or restricted Self Care Goal Status (J1914(G8988): 0 percent impaired, limited or restricted Self Care Discharge Status  (N8295(G8989): 0 percent impaired, limited or restricted  Rhonda Norris,Rhonda Norris 06/18/2016, 12:51 PM   [facility] Center[facility] Roxanna Norris, OTR/L  [phone] 06/18/2016

## 2016-06-18 NOTE — Progress Notes (Signed)
STROKE TEAM PROGRESS NOTE   HISTORY OF PRESENT ILLNESS (per record) Rhonda Norris is an 65 y.o. female who has had stuttering right face and arm numbness and tingling over the last few days. Her BP was 167/75 today. She has no known HTN before. She saw a neurologist as outpatient when this all started and was put on ASA 81 mg which she took earlier today. The symptoms went away today when EMS arrived but recurred at 6:20 pm enroute and resolved in the ER and have been on and off since.   SUBJECTIVE (INTERVAL HISTORY) The patient's husband was at the bedside. The patient had seen Dr. Lucia GaskinsAhern in the office yesterday. Dr Pearlean BrownieSethi discussed the possibility that the symptoms may be partial seizures or atypical migraines. An EEG was ordered.   OBJECTIVE Temp:  [97.6 F (36.4 C)-98.3 F (36.8 C)] 98.3 F (36.8 C) (07/01 1538) Pulse Rate:  [70-86] 81 (07/01 1538) Cardiac Rhythm:  [-] Normal sinus rhythm (07/01 0700) Resp:  [12-18] 17 (07/01 1538) BP: (112-167)/(66-85) 136/73 mmHg (07/01 1538) SpO2:  [96 %-100 %] 98 % (07/01 1538) Weight:  [74.6 kg (164 lb 7.4 oz)] 74.6 kg (164 lb 7.4 oz) (06/30 1847)  CBC:   Recent Labs Lab 06/17/16 1842  WBC 8.8  NEUTROABS 2.5  HGB 12.7  13.6  HCT 40.3  40.0  MCV 89.8  PLT 375    Basic Metabolic Panel:   Recent Labs Lab 06/17/16 1842  NA 137  138  K 4.2  4.2  CL 105  106  CO2 22  GLUCOSE 159*  161*  BUN 11  13  CREATININE 0.73  0.70  CALCIUM 9.1    Lipid Panel:     Component Value Date/Time   CHOL 175 06/18/2016 0447   TRIG 120 06/18/2016 0447   HDL 51 06/18/2016 0447   CHOLHDL 3.4 06/18/2016 0447   VLDL 24 06/18/2016 0447   LDLCALC 100* 06/18/2016 0447   HgbA1c:  Lab Results  Component Value Date   HGBA1C 6.7* 06/17/2016   Urine Drug Screen: No results found for: LABOPIA, COCAINSCRNUR, LABBENZ, AMPHETMU, THCU, LABBARB    IMAGING  Dg Chest 2 View 06/17/2016   Hypoinflation without acute cardiopulmonary  disease.   Mr Maxine GlennMra Head/brain Wo Cm 06/17/2016    MRI HEAD  No acute infarct or intracranial hemorrhage. Minimal chronic microvascular changes. Anterior left frontal 1.2 cm calcification may represent a small meningioma without surrounding vasogenic edema. Cervical spondylotic changes with narrowing ventral thecal sac C2-3 and C3-4 with minimal cord flattening C3-4 level.   MRA HEAD  Anterior circulation without medium or large size vessel significant stenosis or occlusion. Slight narrowing distal left vertebral artery. Slight irregularity of the basilar artery without significant narrowing. Poor delineation of the posterior inferior cerebellar arteries. Only small portion of the anterior inferior cerebellar arteries are visualized.     Ct Head Code Stroke W/o Cm 06/17/2016   No acute abnormality.     PHYSICAL EXAM Pleasant middle aged Caucasian lady not in distress. . Afebrile. Head is nontraumatic. Neck is supple without bruit.    Cardiac exam no murmur or gallop. Lungs are clear to auscultation. Distal pulses are well felt.  Neurological Exam ;  Awake  Alert oriented x 3. Normal speech and language.eye movements full without nystagmus.fundi were not visualized. Vision acuity and fields appear normal. Hearing is normal. Palatal movements are normal. Face symmetric. Tongue midline. Normal strength, tone, reflexes and coordination. Normal sensation. Gait deferred.  ASSESSMENT/PLAN Rhonda Norris is a 65 y.o. female with history of fibromyalgia and arthritis presenting with intermittent right face and arm numbness and tingling. She did not receive IV t-PA due to late presentation.  Possible TIAs:  Dominant secondary to small vessel disease.  Resultant - resolution of deficits  MRI - no infarct  MRA - no significant findings  Carotid Doppler - pending  2D Echo - pending  EEG - pending  LDL - 100  HgbA1c - 6.7  VTE prophylaxis - Lovenox Diet Heart Room service  appropriate?: Yes; Fluid consistency:: Thin  aspirin 81 mg daily prior to admission, now on clopidogrel 75 mg daily  Patient counseled to be compliant with her antithrombotic medications  Ongoing aggressive stroke risk factor management  Therapy recommendations: No follow-up PT or OT recommended  Disposition: Pending  Hypertension  Stable  Permissive hypertension (OK if < 220/120) but gradually normalize in 5-7 days  Long-term BP goal normotensive  Hyperlipidemia  Home meds:  Omega-3 fatty acids prior to admission  LDL 100, goal < 70  Now on simvastatin 10 mg daily  Continue statin at discharge    Other Stroke Risk Factors  Advanced age  Obesity, Body mass index is 28.22 kg/(m^2)., recommend weight loss, diet and exercise as appropriate     Other Active Problems  Somewhat elevated hemoglobin A1c  Cervical spondylotic changes - possible etiology of patient's symptoms ?  EEG pending - consider Depakote or Topamax therapy.  Hospital day #   Delton SeeDavid Rinehuls PA-C [facility] Pager [phone] 06/18/2016, 5:02 PM I have personally examined this patient, reviewed notes, independently viewed imaging studies, participated in medical decision making and plan of care. I have made any additions or clarifications directly to the above note. Agree with note above. She presented with recurrent flitting paresthesias on right side  Of unclear etiology possibly small vessel disease TIA versus atypical migraine or sensory seizures    and remains at risk for neurological worsening, recurrent stroke/TIA/seizures and needs ongoing stroke evaluation and aggressive risk factor modification.Chjeck EEG.Agree with Plavix for stroke prevention.May consider trial of Topamax if symptoms recur. I spent a total of 35   Minutesin face to face in clinical consultation, greater than 50% of which was counseling/coordinating care of care about TIA,migraine and seizures.D/w daughter and  medical hospitalist.   Delia HeadyPramod Misael Mcgaha, MD Medical Director [facility] Pager: [phone] 06/18/2016 7:16 PM     To contact Stroke Continuity provider, please refer to [email or web address]. After hours, contact General Neurology

## 2016-06-18 NOTE — Evaluation (Signed)
Physical Therapy Evaluation and Discharge Patient Details Name: Venita LickSaundra B Scalzo MRN: [id number] DOB: [birth year] Today's Date: 06/18/2016   History of Present Illness  65 y.o. female admitted for Possible recurrent TIAs..  Clinical Impression  Patient evaluated by Physical Therapy with no further acute PT needs identified. All education has been completed and the patient has no further questions. Ambulates without significant deviations. Reports she feels back to baseline. Tolerates higher level dynamic gait tasks without loss of balance. No follow-up or equipment needs recommended. PT is signing off. Thank you for this referral.     Follow Up Recommendations No PT follow up    Equipment Recommendations  None recommended by PT    Recommendations for Other Services       Precautions / Restrictions Precautions Precautions: None Restrictions Weight Bearing Restrictions: No      Mobility  Bed Mobility Overal bed mobility: Independent                Transfers Overall transfer level: Modified independent               General transfer comment: extra time no assist  Ambulation/Gait Ambulation/Gait assistance: Modified independent (Device/Increase time) Ambulation Distance (Feet): 200 Feet Assistive device: None Gait Pattern/deviations: Step-through pattern Gait velocity: decreased Gait velocity interpretation: Below normal speed for age/gender General Gait Details: Slow and cautious but without any overt loss of balance or notable gait abnormalities. Able to perform dynamic gait tasks safely including, variable speeds, quick turns, high marches, and changes in head position.  Stairs            Wheelchair Mobility    Modified Rankin (Stroke Patients Only) Modified Rankin (Stroke Patients Only) Pre-Morbid Rankin Score: No symptoms Modified Rankin: No significant disability     Balance Overall balance assessment: Independent                                           Pertinent Vitals/Pain Pain Assessment: No/denies pain    Home Living Family/patient expects to be discharged to:: Private residence Living Arrangements: Spouse/significant other;Children Available Help at Discharge: Family;Available 24 hours/day Type of Home: House Home Access: Stairs to enter Entrance Stairs-Rails: Right Entrance Stairs-Number of Steps: 4 Home Layout: Multi-level Home Equipment: None      Prior Function Level of Independence: Independent               Hand Dominance   Dominant Hand: Right    Extremity/Trunk Assessment   Upper Extremity Assessment: Defer to OT evaluation           Lower Extremity Assessment: Overall WFL for tasks assessed (No focal deficits, normal light touch sensation & strength)         Communication   Communication: No difficulties  Cognition Arousal/Alertness: Awake/alert Behavior During Therapy: WFL for tasks assessed/performed Overall Cognitive Status: Within Functional Limits for tasks assessed                      General Comments General comments (skin integrity, edema, etc.): Reviewed FAST acronym including modifiable risk factors for CVA/TIA.    Exercises        Assessment/Plan    PT Assessment Patent does not need any further PT services  PT Diagnosis Abnormality of gait   PT Problem List    PT Treatment Interventions     PT Goals (Current goals  can be found in the Care Plan section) Acute Rehab PT Goals Patient Stated Goal: No more symtoms PT Goal Formulation: All assessment and education complete, DC therapy    Frequency     Barriers to discharge        Co-evaluation               End of Session   Activity Tolerance: Patient tolerated treatment well Patient left: in bed;with call bell/phone within reach;with bed alarm set;with family/visitor present Nurse Communication: Mobility status    Functional Assessment Tool Used: clinical  observation Functional Limitation: Mobility: Walking and moving around Mobility: Walking and Moving Around Current Status [phone](G8978): 0 percent impaired, limited or restricted Mobility: Walking and Moving Around Goal Status [phone](G8979): 0 percent impaired, limited or restricted Mobility: Walking and Moving Around Discharge Status [phone](G8980): 0 percent impaired, limited or restricted    Time: [id number] PT Time Calculation (min) (ACUTE ONLY): 14 min   Charges:   PT Evaluation $PT Eval Low Complexity: 1 Procedure     PT G Codes:   PT G-Codes **NOT FOR INPATIENT CLASS** Functional Assessment Tool Used: clinical observation Functional Limitation: Mobility: Walking and moving around Mobility: Walking and Moving Around Current Status ([phone]([id number]): 0 percent impaired, limited or restricted Mobility: Walking and Moving Around Goal Status ([phone](G8979): 0 percent impaired, limited or restricted Mobility: Walking and Moving Around Discharge Status [phone]([phone]): 0 percent impaired, limited or restricted    Berton MountBarbour, Ytzel Gubler S 06/18/2016, 9:40 AM  Charlsie MerlesLogan Secor Lakea Mittelman, PT [phone]

## 2016-06-18 NOTE — Progress Notes (Signed)
Rhonda Norris is a 65 y.o. female patient admitted from ED awake, alert - oriented  X 4 - no acute distress noted.  VSS - Blood pressure 144/78, pulse 74, temperature 98.2 F (36.8 C), temperature source Oral, resp. rate 18, height 5\' 4"  (1.626 m), weight 74.6 kg (164 lb 7.4 oz), SpO2 99 %.    IV in place, occlusive dsg intact without redness.  Orientation to room, and floor completed with information packet given to patient/family.  Patient declined safety video at this time.  Admission INP armband ID verified with patient/family, and in place.  SR up x 2, fall assessment complete, with patient and family able to verbalize understanding of risk associated with falls, and verbalized understanding to call nsg before up out of bed.  Call light within reach, patient able to voice, and demonstrate understanding.  Skin, clean-dry- intact without evidence of bruising, or skin tears.  No evidence of skin break down noted on exam.     Will continue to evaluate and treat per MD orders.  Otis DialsCassandra J Shafter Jupin, RN 06/18/2016 12:24 AM

## 2016-06-19 ENCOUNTER — Observation Stay (HOSPITAL_BASED_OUTPATIENT_CLINIC_OR_DEPARTMENT_OTHER): Payer: Medicare Other

## 2016-06-19 DIAGNOSIS — G459 Transient cerebral ischemic attack, unspecified: Secondary | ICD-10-CM

## 2016-06-19 DIAGNOSIS — I6789 Other cerebrovascular disease: Secondary | ICD-10-CM | POA: Diagnosis not present

## 2016-06-19 DIAGNOSIS — G458 Other transient cerebral ischemic attacks and related syndromes: Secondary | ICD-10-CM | POA: Diagnosis not present

## 2016-06-19 DIAGNOSIS — R200 Anesthesia of skin: Secondary | ICD-10-CM | POA: Diagnosis not present

## 2016-06-19 DIAGNOSIS — R208 Other disturbances of skin sensation: Secondary | ICD-10-CM | POA: Diagnosis not present

## 2016-06-19 LAB — VAS US CAROTID
LEFT ECA DIAS: -12 cm/s
LEFT VERTEBRAL DIAS: -16 cm/s
Left CCA dist dias: -31 cm/s
Left CCA dist sys: -131 cm/s
Left CCA prox dias: 27 cm/s
Left CCA prox sys: 114 cm/s
Left ICA dist dias: -27 cm/s
Left ICA dist sys: -88 cm/s
Left ICA prox dias: -26 cm/s
Left ICA prox sys: -93 cm/s
RIGHT ECA DIAS: -16 cm/s
RIGHT VERTEBRAL DIAS: 15 cm/s
Right CCA prox dias: 14 cm/s
Right CCA prox sys: 103 cm/s
Right cca dist sys: -89 cm/s

## 2016-06-19 LAB — ECHOCARDIOGRAM COMPLETE
E decel time: 239 msec
E/e' ratio: 7.89
FS: 42 % (ref 28–44)
Height: 64 in
IVS/LV PW RATIO, ED: 1.55
LA ID, A-P, ES: 26 mm
LA diam end sys: 26 mm
LA diam index: 1.4 cm/m2
LA vol A4C: 24.5 ml
LA vol index: 15.4 mL/m2
LA vol: 28.6 mL
LV E/e' medial: 7.89
LV E/e'average: 7.89
LV PW d: 7.88 mm — AB (ref 0.6–1.1)
LV e' LATERAL: 7.07 cm/s
LVOT SV: 52 mL
LVOT VTI: 22.7 cm
LVOT area: 2.27 cm2
LVOT diameter: 17 mm
LVOT peak grad rest: 5 mmHg
LVOT peak vel: 112 cm/s
MV Dec: 239
MV pk A vel: 78.2 m/s
MV pk E vel: 55.8 m/s
TAPSE: 16.3 mm
TDI e' lateral: 7.07
TDI e' medial: 5.22
Weight: 2631.41 oz

## 2016-06-19 LAB — BASIC METABOLIC PANEL
Anion gap: 8 (ref 5–15)
BUN: 15 mg/dL (ref 6–20)
CALCIUM: 9.4 mg/dL (ref 8.9–10.3)
CO2: 26 mmol/L (ref 22–32)
CREATININE: 0.72 mg/dL (ref 0.44–1.00)
Chloride: 104 mmol/L (ref 101–111)
GFR calc Af Amer: >60 mL/min (ref >60)
GFR calc non Af Amer: >60 mL/min (ref >60)
GLUCOSE: 103 mg/dL — AB (ref 65–99)
Potassium: 4.1 mmol/L (ref 3.5–5.1)
Sodium: 138 mmol/L (ref 135–145)

## 2016-06-19 MED ORDER — PERFLUTREN LIPID MICROSPHERE
1.0000 mL | INTRAVENOUS | Status: AC | PRN
  Administered 2016-06-19: 2 mL via INTRAVENOUS
  Filled 2016-06-19: qty 10

## 2016-06-19 NOTE — Progress Notes (Signed)
VASCULAR LAB PRELIMINARY  PRELIMINARY  PRELIMINARY  PRELIMINARY  Carotid duplex completed.    Preliminary report:  1-39% ICA plaquing.  Vertebral artery flow is antegrade.   Brennley Curtice, RVT 06/19/2016, 3:02 PM

## 2016-06-19 NOTE — Progress Notes (Signed)
  Echocardiogram 2D Echocardiogram with Definity has been performed.  Leta JunglingCooper, Evoleht Hovatter M 06/19/2016, 8:43 AM

## 2016-06-19 NOTE — Progress Notes (Signed)
PROGRESS NOTE    Rhonda Norris  NWG:[id number] DOB: October 10, 1951 DOA: 06/17/2016 PCP: Mickie Hillier, MD    Brief Narrative:  Rhonda Norris is a 65 y.o. female who presents to the ED with c/o intermittent R sided face and arm numbness off and on over the past 2 days. BP elevated today at 167/75. No known PMH of HTN previously. Saw neurologist as outpatient who put her on ASA 60 which she took earlier today. EMS called today for persistently reoccurring symptoms.  ED Course: Seen by neurology who recs admit and work up as well as ASA 325+plavix.  Assessment & Plan:   Active Problems:   TIA (transient ischemic attack)   Right facial numbness   Numbness and tingling of right upper extremity   Hyperlipidemia  #1 right facial numbness/right upper extremity numbness and tingling/?? Recurrent TIAs Differential of patient's symptoms include recurrent TIAs versus complex migraines versus seizure disorder. Patient denies any headaches. MRI/MRA of the head done was negative for any acute infarct. Patient with resolution of deficits on presentation to the ED and assessed TPA was not administered. Stroke workup underway including carotid Dopplers and 2-D echo. Fasting lipid panel with LDL of 100. Hemoglobin A1c was 6.7. EEG has been ordered and is pending. Continue statin. Aspirin has been changed to Plavix per neurology recommendations. Neurology following and appreciate input and recommendations.  #2 hyperlipidemia LDL of 100. Patient started on Zocor 10 mg daily. Goal LDL less than 70.   #3 elevated blood pressure Improved. Follow.   DVT prophylaxis: Lovenox Code Status: Full Family Communication: Updated patient and family at bedside. Disposition Plan: Home once workup completed and patient clinically improved.   Consultants:    Neurology: Dr.Eshraghi  Procedures:   MRI brain MRI head 06/17/2016  Chest x-ray 06/17/2016  Antimicrobials:    None   Subjective: Patient states right facial and upper extremity numbness and tingling improved. No further numbness or tingling. No chest pain. No shortness of breath.  Ogbjective: Filed Vitals:   06/18/16 1538 06/18/16 2202 06/19/16 0230 06/19/16 0410  BP: 136/73 115/77 115/70 145/74  Pulse: 81 94 76 67  Temp: 98.3 F (36.8 C) 98.9 F (37.2 C) 98.2 F (36.8 C) 97.9 F (36.6 C)  TempSrc: Oral Oral Oral Oral  Resp: 17 16 16 14   Height:      Weight:      SpO2: 98% 96% 100% 97%    Intake/Output Summary (Last 24 hours) at 06/19/16 1252 Last data filed at 06/19/16 1000  Gross per 24 hour  Intake    230 ml  Output      0 ml  Net    230 ml   Filed Weights   06/17/16 1844 06/17/16 1847  Weight: 74.6 kg (164 lb 7.4 oz) 74.6 kg (164 lb 7.4 oz)    Examination:  General exam: Appears calm and comfortable  Respiratory system: Clear to auscultation. Respiratory effort normal. Cardiovascular system: S1 & S2 heard, RRR. No JVD, murmurs, rubs, gallops or clicks. No pedal edema. Gastrointestinal system: Abdomen is nondistended, soft and nontender. No organomegaly or masses felt. Normal bowel sounds heard. Central nervous system: Alert and oriented. No focal neurological deficits. Extremities: Symmetric 5 x 5 power. Skin: No rashes, lesions or ulcers Psychiatry: Judgement and insight appear normal. Mood & affect appropriate.     Data Reviewed: I have personally reviewed following labs and imaging studies  CBC:  Recent Labs Lab 06/17/16 1842  WBC 8.8  NEUTROABS 2.5  HGB 12.7  13.6  HCT 40.3  40.0  MCV 89.8  PLT 375   Basic Metabolic Panel:  Recent Labs Lab 06/17/16 1842 06/19/16 0425  NA 137  138 138  K 4.2  4.2 4.1  CL 105  106 104  CO2 22 26  GLUCOSE 159*  161* 103*  BUN 11  13 15   CREATININE 0.73  0.70 0.72  CALCIUM 9.1 9.4   GFR: Estimated Creatinine Clearance: 69.4 mL/min (by C-G formula based on Cr of 0.72). Liver Function  Tests:  Recent Labs Lab 06/17/16 1842  AST 35  ALT 26  ALKPHOS 80  BILITOT 0.6  PROT 7.1  ALBUMIN 3.9   No results for input(s): LIPASE, AMYLASE in the last 168 hours. No results for input(s): AMMONIA in the last 168 hours. Coagulation Profile:  Recent Labs Lab 06/17/16 1842  INR 0.91   Cardiac Enzymes: No results for input(s): CKTOTAL, CKMB, CKMBINDEX, TROPONINI in the last 168 hours. BNP (last 3 results) No results for input(s): PROBNP in the last 8760 hours. HbA1C:  Recent Labs  06/17/16 1117  HGBA1C 6.7*   CBG:  Recent Labs Lab 06/17/16 1846  GLUCAP 131*   Lipid Profile:  Recent Labs  06/18/16 0447  CHOL 175  HDL 51  LDLCALC 100*  TRIG 120  CHOLHDL 3.4   Thyroid Function Tests: No results for input(s): TSH, T4TOTAL, FREET4, T3FREE, THYROIDAB in the last 72 hours. Anemia Panel: No results for input(s): VITAMINB12, FOLATE, FERRITIN, TIBC, IRON, RETICCTPCT in the last 72 hours. Sepsis Labs: No results for input(s): PROCALCITON, LATICACIDVEN in the last 168 hours.  No results found for this or any previous visit (from the past 240 hour(s)).       Radiology Studies: Dg Chest 2 View  06/17/2016  CLINICAL DATA:  Right-sided weakness 3 days. EXAM: CHEST  2 VIEW COMPARISON:  None. FINDINGS: Lungs are somewhat hypoinflated without focal consolidation or effusion. Cardiomediastinal silhouette is within normal. There is mild calcified plaque over the aortic arch. There mild degenerate changes of the spine. Anterior fusion hardware partially visualized over the cervical spine. IMPRESSION: Hypoinflation without acute cardiopulmonary disease. Electronically Signed   By: Elberta Fortisaniel  Boyle M.D.   On: 06/17/2016 20:18   Mr Brain Wo Contrast  06/17/2016  CLINICAL DATA:  65 year old female with intermittent right-sided facial and arm numbness off and on for the past 2 days. Elevated blood pressure. Subsequent encounter. EXAM: MRI HEAD WITHOUT CONTRAST MRA HEAD WITHOUT  CONTRAST TECHNIQUE: Multiplanar, multiecho pulse sequences of the brain and surrounding structures were obtained without intravenous contrast. Angiographic images of the head were obtained using MRA technique without contrast. COMPARISON:  06/17/2016 head CT.  No comparison MR. FINDINGS: MRI HEAD FINDINGS No acute infarct or intracranial hemorrhage. Minimal chronic microvascular changes. No hydrocephalus. Anterior left frontal 1.2 cm calcification may represent a small meningioma without surrounding vasogenic edema. Major intracranial vascular structures are patent. Cervical spondylotic changes with narrowing ventral thecal sac C2-3 and C3-4 with minimal cord flattening C3-4 level. Cervical medullary junction unremarkable. Prominent perivascular spaces incidentally noted. No acute orbital abnormality. MRA HEAD FINDINGS Anterior circulation without medium or large size vessel significant stenosis or occlusion. Slightly prominent infundibulum without aneurysm noted. Right vertebral artery is dominant. Slight narrowing distal left vertebral artery. Slight irregularity of the basilar artery without significant narrowing. Poor delineation of the posterior inferior cerebellar arteries. Only small portion of the anterior inferior cerebellar arteries are visualized. Slight irregularity of portions of the posterior cerebral arteries without  high-grade stenosis. IMPRESSION: MRI HEAD No acute infarct or intracranial hemorrhage. Minimal chronic microvascular changes. Anterior left frontal 1.2 cm calcification may represent a small meningioma without surrounding vasogenic edema. Cervical spondylotic changes with narrowing ventral thecal sac C2-3 and C3-4 with minimal cord flattening C3-4 level. MRA HEAD Anterior circulation without medium or large size vessel significant stenosis or occlusion. Slight narrowing distal left vertebral artery. Slight irregularity of the basilar artery without significant narrowing. Poor delineation  of the posterior inferior cerebellar arteries. Only small portion of the anterior inferior cerebellar arteries are visualized. Electronically Signed   By: Lacy DuverneySteven  Olson M.D.   On: 06/17/2016 22:18   Mr Maxine GlennMra Head/brain Wo Cm  06/17/2016  CLINICAL DATA:  65 year old female with intermittent right-sided facial and arm numbness off and on for the past 2 days. Elevated blood pressure. Subsequent encounter. EXAM: MRI HEAD WITHOUT CONTRAST MRA HEAD WITHOUT CONTRAST TECHNIQUE: Multiplanar, multiecho pulse sequences of the brain and surrounding structures were obtained without intravenous contrast. Angiographic images of the head were obtained using MRA technique without contrast. COMPARISON:  06/17/2016 head CT.  No comparison MR. FINDINGS: MRI HEAD FINDINGS No acute infarct or intracranial hemorrhage. Minimal chronic microvascular changes. No hydrocephalus. Anterior left frontal 1.2 cm calcification may represent a small meningioma without surrounding vasogenic edema. Major intracranial vascular structures are patent. Cervical spondylotic changes with narrowing ventral thecal sac C2-3 and C3-4 with minimal cord flattening C3-4 level. Cervical medullary junction unremarkable. Prominent perivascular spaces incidentally noted. No acute orbital abnormality. MRA HEAD FINDINGS Anterior circulation without medium or large size vessel significant stenosis or occlusion. Slightly prominent infundibulum without aneurysm noted. Right vertebral artery is dominant. Slight narrowing distal left vertebral artery. Slight irregularity of the basilar artery without significant narrowing. Poor delineation of the posterior inferior cerebellar arteries. Only small portion of the anterior inferior cerebellar arteries are visualized. Slight irregularity of portions of the posterior cerebral arteries without high-grade stenosis. IMPRESSION: MRI HEAD No acute infarct or intracranial hemorrhage. Minimal chronic microvascular changes. Anterior left  frontal 1.2 cm calcification may represent a small meningioma without surrounding vasogenic edema. Cervical spondylotic changes with narrowing ventral thecal sac C2-3 and C3-4 with minimal cord flattening C3-4 level. MRA HEAD Anterior circulation without medium or large size vessel significant stenosis or occlusion. Slight narrowing distal left vertebral artery. Slight irregularity of the basilar artery without significant narrowing. Poor delineation of the posterior inferior cerebellar arteries. Only small portion of the anterior inferior cerebellar arteries are visualized. Electronically Signed   By: Lacy DuverneySteven  Olson M.D.   On: 06/17/2016 22:18   Ct Head Code Stroke W/o Cm  06/17/2016  CLINICAL DATA:  Code stroke.  Difficulty speaking EXAM: CT HEAD WITHOUT CONTRAST TECHNIQUE: Contiguous axial images were obtained from the base of the skull through the vertex without intravenous contrast. COMPARISON:  None. FINDINGS: Ventricle size normal.  Cerebral volume normal for age. Negative for acute or chronic infarction. Negative for intracranial hemorrhage. No shift of the midline structures Negative calvarium. Calcific density adjacent to the inner table left frontal lobe may be a calcified meningioma or benign exostosis. IMPRESSION: No acute abnormality. These results were called by telephone at the time of interpretation on 06/17/2016 at 6:52 pm to Dr. Fredric DineEshragi, who verbally acknowledged these results. Electronically Signed   By: Marlan Palauharles  Clark M.D.   On: 06/17/2016 18:51        Scheduled Meds: . clopidogrel  75 mg Oral Daily  . enoxaparin (LOVENOX) injection  40 mg Subcutaneous Q24H  . famotidine  20 mg Oral Daily  . multivitamin with minerals  1 tablet Oral Daily  . nortriptyline  25 mg Oral QHS  . polyethylene glycol  17 g Oral Daily  . simvastatin  10 mg Oral q1800   Continuous Infusions:       Time spent: 35 minutes    Dorette Hartel, MD [facility] Pager [phone]  If  7PM-7AM, please contact night-coverage [email or web address] Password [email or web address] 06/19/2016, 12:52 PM

## 2016-06-19 NOTE — Progress Notes (Signed)
STROKE TEAM PROGRESS NOTE   HISTORY OF PRESENT ILLNESS (per record) Rhonda Norris is an 65 y.o. female who has had stuttering right face and arm numbness and tingling over the last few days. Her BP was 167/75 today. She has no known HTN before. She saw a neurologist as outpatient when this all started and was put on ASA 81 mg which she took earlier today. The symptoms went away today when EMS arrived but recurred at 6:20 pm enroute and resolved in the ER and have been on and off since.   SUBJECTIVE (INTERVAL HISTORY) The patient's daughter and husband  were at the bedside.She has not had any recurrent paresthesias.. An EEG was ordered but not yet done..   OBJECTIVE Temp:  [97.9 F (36.6 C)-98.9 F (37.2 C)] 97.9 F (36.6 C) (07/02 0410) Pulse Rate:  [67-94] 67 (07/02 0410) Cardiac Rhythm:  [-] Normal sinus rhythm (07/02 0704) Resp:  [14-17] 14 (07/02 0410) BP: (115-145)/(70-77) 145/74 mmHg (07/02 0410) SpO2:  [96 %-100 %] 97 % (07/02 0410)  CBC:   Recent Labs Lab 06/17/16 1842  WBC 8.8  NEUTROABS 2.5  HGB 12.7  13.6  HCT 40.3  40.0  MCV 89.8  PLT 375    Basic Metabolic Panel:   Recent Labs Lab 06/17/16 1842 06/19/16 0425  NA 137  138 138  K 4.2  4.2 4.1  CL 105  106 104  CO2 22 26  GLUCOSE 159*  161* 103*  BUN 11  13 15   CREATININE 0.73  0.70 0.72  CALCIUM 9.1 9.4    Lipid Panel:     Component Value Date/Time   CHOL 175 06/18/2016 0447   TRIG 120 06/18/2016 0447   HDL 51 06/18/2016 0447   CHOLHDL 3.4 06/18/2016 0447   VLDL 24 06/18/2016 0447   LDLCALC 100* 06/18/2016 0447   HgbA1c:  Lab Results  Component Value Date   HGBA1C 6.7* 06/17/2016   Urine Drug Screen: No results found for: LABOPIA, COCAINSCRNUR, LABBENZ, AMPHETMU, THCU, LABBARB    IMAGING  Dg Chest 2 View 06/17/2016   Hypoinflation without acute cardiopulmonary disease.   Mr Maxine GlennMra Head/brain Wo Cm 06/17/2016    MRI HEAD  No acute infarct or intracranial hemorrhage.  Minimal chronic microvascular changes. Anterior left frontal 1.2 cm calcification may represent a small meningioma without surrounding vasogenic edema. Cervical spondylotic changes with narrowing ventral thecal sac C2-3 and C3-4 with minimal cord flattening C3-4 level.   MRA HEAD  Anterior circulation without medium or large size vessel significant stenosis or occlusion. Slight narrowing distal left vertebral artery. Slight irregularity of the basilar artery without significant narrowing. Poor delineation of the posterior inferior cerebellar arteries. Only small portion of the anterior inferior cerebellar arteries are visualized.     Ct Head Code Stroke W/o Cm 06/17/2016   No acute abnormality.     PHYSICAL EXAM Pleasant middle aged Caucasian lady not in distress. . Afebrile. Head is nontraumatic. Neck is supple without bruit.    Cardiac exam no murmur or gallop. Lungs are clear to auscultation. Distal pulses are well felt.  Neurological Exam ;  Awake  Alert oriented x 3. Normal speech and language.eye movements full without nystagmus.fundi were not visualized. Vision acuity and fields appear normal. Hearing is normal. Palatal movements are normal. Face symmetric. Tongue midline. Normal strength, tone, reflexes and coordination. Normal sensation. Gait deferred.      ASSESSMENT/PLAN Ms. Rhonda Norris is a 65 y.o. female with history of fibromyalgia and  arthritis presenting with intermittent right face and arm numbness and tingling. She did not receive IV t-PA due to late presentation.  Possible TIAs:  Dominant secondary to small vessel disease.  Resultant - resolution of deficits  MRI - no infarct  MRA - no significant findings  Carotid Doppler - pending 2D Echo - normal LV systolic function; grade 1 diastolic   dysfunction; sclerotic aortic valve with mild AI.  EEG - pending  LDL - 100  HgbA1c - 6.7  VTE prophylaxis - Lovenox Diet Heart Room service appropriate?:  Yes; Fluid consistency:: Thin  aspirin 81 mg daily prior to admission, now on clopidogrel 75 mg daily  Patient counseled to be compliant with her antithrombotic medications  Ongoing aggressive stroke risk factor management  Therapy recommendations: No follow-up PT or OT recommended  Disposition: Pending  Hypertension  Stable  Permissive hypertension (OK if < 220/120) but gradually normalize in 5-7 days  Long-term BP goal normotensive  Hyperlipidemia  Home meds:  Omega-3 fatty acids prior to admission  LDL 100, goal < 70  Now on simvastatin 10 mg daily  Continue statin at discharge    Other Stroke Risk Factors  Advanced age  Obesity, Body mass index is 28.22 kg/(m^2)., recommend weight loss, diet and exercise as appropriate     Other Active Problems  Somewhat elevated hemoglobin A1c  Cervical spondylotic changes - possible etiology of patient's symptoms ?  EEG pending - consider Depakote or Topamax therapy.  Hospital day #     I have personally examined this patient, reviewed notes, independently viewed imaging studies, participated in medical decision making and plan of care. I have made any additions or clarifications directly to the above note  She presented with recurrent flitting paresthesias on right side  Of unclear etiology possibly small vessel disease TIA versus atypical migraine or sensory seizures    and remains at risk for neurological worsening, recurrent stroke/TIA/seizures  .Continue ongoing stroke evaluation and aggressive risk factor modification.Continue ongoing stroke eval and EEG.Agree with Plavix for stroke prevention.May consider trial of Topamax if symptoms recur. I spent a total of 15   Minutesin face to face in clinical consultation, greater than 50% of which was counseling/coordinating care of care about TIA,migraine and seizures.D/w daughter and medical hospitalist.   Delia HeadyPramod Connor Meacham, MD Medical Director [facility] Pager:  [phone] 06/19/2016 12:21 PM     To contact Stroke Continuity provider, please refer to [email or web address]. After hours, contact General Neurology

## 2016-06-20 ENCOUNTER — Observation Stay (HOSPITAL_COMMUNITY): Payer: Medicare Other

## 2016-06-20 ENCOUNTER — Telehealth: Payer: Self-pay | Admitting: *Deleted

## 2016-06-20 DIAGNOSIS — K219 Gastro-esophageal reflux disease without esophagitis: Secondary | ICD-10-CM | POA: Diagnosis not present

## 2016-06-20 DIAGNOSIS — E785 Hyperlipidemia, unspecified: Secondary | ICD-10-CM | POA: Diagnosis not present

## 2016-06-20 DIAGNOSIS — R202 Paresthesia of skin: Secondary | ICD-10-CM

## 2016-06-20 DIAGNOSIS — R200 Anesthesia of skin: Secondary | ICD-10-CM | POA: Diagnosis not present

## 2016-06-20 DIAGNOSIS — G43909 Migraine, unspecified, not intractable, without status migrainosus: Secondary | ICD-10-CM

## 2016-06-20 DIAGNOSIS — R208 Other disturbances of skin sensation: Secondary | ICD-10-CM | POA: Diagnosis not present

## 2016-06-20 LAB — HEMOGLOBIN A1C
HEMOGLOBIN A1C: 6.5 % — AB (ref 4.8–5.6)
MEAN PLASMA GLUCOSE: 140 mg/dL

## 2016-06-20 MED ORDER — CLOPIDOGREL BISULFATE 75 MG PO TABS: 75.0000 mg | ORAL_TABLET | Freq: Every day | ORAL | Status: AC

## 2016-06-20 MED ORDER — SIMVASTATIN 10 MG PO TABS: 10.0000 mg | ORAL_TABLET | Freq: Every day | ORAL | Status: DC

## 2016-06-20 NOTE — Progress Notes (Signed)
STROKE TEAM PROGRESS NOTE   SUBJECTIVE (INTERVAL HISTORY) The patient's family are at the bedside. She had episode of imbalance, dizziness last Wednesday, resolved in 4 hours. Saw PCP Thursday, and Dr. Lucia GaskinsAhern Friday. At evening of Friday, pt had episode of right facd, arm numbness and tingling, resolved in 45 min. She admitted that she has "sinus HA" in the past and also HA this Friday with the episodes. Denies any HTN, DM, HLD, smoking or heavy alcohol or illicit drugs.     OBJECTIVE Temp:  [97.7 F (36.5 C)-98.1 F (36.7 C)] 98.1 F (36.7 C) (07/03 0522) Pulse Rate:  [72-89] 72 (07/03 0522) Cardiac Rhythm:  [-] Normal sinus rhythm (07/03 0700) Resp:  [16-18] 18 (07/03 0522) BP: (112-133)/(65-75) 133/75 mmHg (07/03 0522) SpO2:  [94 %-97 %] 96 % (07/03 0522)  CBC:   Recent Labs Lab 06/17/16 1842  WBC 8.8  NEUTROABS 2.5  HGB 12.7  13.6  HCT 40.3  40.0  MCV 89.8  PLT 375    Basic Metabolic Panel:   Recent Labs Lab 06/17/16 1842 06/19/16 0425  NA 137  138 138  K 4.2  4.2 4.1  CL 105  106 104  CO2 22 26  GLUCOSE 159*  161* 103*  BUN 11  13 15   CREATININE 0.73  0.70 0.72  CALCIUM 9.1 9.4    Lipid Panel:     Component Value Date/Time   CHOL 175 06/18/2016 0447   TRIG 120 06/18/2016 0447   HDL 51 06/18/2016 0447   CHOLHDL 3.4 06/18/2016 0447   VLDL 24 06/18/2016 0447   LDLCALC 100* 06/18/2016 0447   HgbA1c:  Lab Results  Component Value Date   HGBA1C 6.5* 06/18/2016   Urine Drug Screen: No results found for: LABOPIA, COCAINSCRNUR, LABBENZ, AMPHETMU, THCU, LABBARB    IMAGING I have personally reviewed the radiological images below and agree with the radiology interpretations.  Dg Chest 2 View 06/17/2016   Hypoinflation without acute cardiopulmonary disease.   Ct Head Code Stroke W/o Cm 06/17/2016   No acute abnormality.   MRI HEAD  06/17/2016   No acute infarct or intracranial hemorrhage. Minimal chronic microvascular changes. Anterior left  frontal 1.2 cm calcification may represent a small meningioma without surrounding vasogenic edema. Cervical spondylotic changes with narrowing ventral thecal sac C2-3 and C3-4 with minimal cord flattening C3-4 level.   MRA HEAD  06/17/2016   Anterior circulation without medium or large size vessel significant stenosis or occlusion. Slight narrowing distal left vertebral artery. Slight irregularity of the basilar artery without significant narrowing. Poor delineation of the posterior inferior cerebellar arteries. Only small portion of the anterior inferior cerebellar arteries are visualized.   Carotid Doppler   There is 1-39% bilateral ICA stenosis. Vertebral artery flow is antegrade.   2D Echocardiogram  - Left ventricle: The cavity size was normal. There was mild focal basal hypertrophy of the septum. Systolic function was normal. The estimated ejection fraction was in the range of 60% to 65%. Wall motion was norma there were no regional wall motion abnormalities. Doppler parameters are consistent with abnormal left ventricular relaxation (grade 1 diastolic dysfunction). - Aortic valve: There was mild regurgitation. - Mitral valve: Calcified annulus. Impressions:   Definity used; normal LV systolic function; grade 1 diastolic dysfunction; sclerotic aortic valve with mild AI.  EEG - pending    PHYSICAL EXAM Pleasant middle aged Caucasian lady not in distress. . Afebrile. Head is nontraumatic. Neck is supple without bruit. Cardiac exam no murmur or  gallop. Lungs are clear to auscultation. Distal pulses are well felt. Neurological Exam ;  Awake  Alert oriented x 3. Normal speech and language.eye movements full without nystagmus.fundi were not visualized. Vision acuity and fields appear normal. Hearing is normal. Palatal movements are normal. Face symmetric. Tongue midline. Normal strength, tone, reflexes and coordination. Normal sensation. Gait deferred.   ASSESSMENT/PLAN Ms. Rhonda Norris  is a 65 y.o. female with history of fibromyalgia and arthritis presenting with intermittent right face and arm numbness and tingling. She did not receive IV t-PA due to lat e presentation.  Transient episode of right face and arm numbness / tingling with HA - most likely complicated migraine vs. Anxiety, although TIA can not be completely ruled out.  Resultant - resolution of deficits  MRI - no infarct  MRA - no significant findings  Carotid Doppler - No significant stenosis   2D Echo - unremarkable  EEG - done, results pending  LDL - 100  HgbA1c - 6.7  VTE prophylaxis - Lovenox Diet Heart Room service appropriate?: Yes; Fluid consistency:: Thin  aspirin 81 mg daily prior to admission, changed to  clopidogrel 75 mg daily  Patient counseled to be compliant with her antithrombotic medications  Ongoing aggressive stroke risk factor management  Therapy recommendations: No follow-up PT or OT recommended  Disposition: return home  Followed by Dr. Lucia GaskinsAhern prior to admission. Follow up with her at discharge. Order written.  Hyperlipidemia  Home meds:  Omega-3 fatty acids prior to admission  LDL 100, goal < 70  Now on simvastatin 10 mg daily  Continue statin at discharge  Other Stroke Risk Factors  Advanced age  Other Active Problems  Cervical spondylotic changes s/p surgery  Fibromyalgia  IBD  Hospital day # 1  Neurology will sign off. Please call with questions. [facility]. Thanks for the consult.  Marvel PlanJindong Jahleah Mariscal, MD PhD Stroke Neurology 06/20/2016 4:21 PM    To contact Stroke Continuity provider, please refer to [email or web address]. After hours, contact General Neurology

## 2016-06-20 NOTE — Care Management Note (Signed)
Case Management Note  Patient Details  Name: Rhonda Norris MRN: [id number] Date of Birth: [birth year]  Subjective/Objective:     Pt admitted with TIA. MRI results negative. She is from home with her spouse.                Action/Plan: No f/u per PT/OT. CM following for discharge needs.   Expected Discharge Date:                  Expected Discharge Plan:  Home/Self Care  In-House Referral:     Discharge planning Services     Post Acute Care Choice:    Choice offered to:     DME Arranged:    DME Agency:     HH Arranged:    HH Agency:     Status of Service:  In process, will continue to follow  If discussed at Long Length of Stay Meetings, dates discussed:    Additional Comments:  Kermit BaloKelli F Burns Timson, RN 06/20/2016, 2:15 PM

## 2016-06-20 NOTE — Discharge Summary (Signed)
Physician Discharge Summary  Rhonda Norris ZOX:[id number] DOB: 06/22/1951 DOA: 06/17/2016  PCP: Rhonda Hillier, MD  Admit date: 06/17/2016 Discharge date: 06/20/2016  Time spent: 65 minutes  Recommendations for Outpatient Follow-up:  1. Follow-up with Rhonda. Lucia Norris, neurology in one month. On follow-up EEG results will need to be followed up upon. Patient is transient right sided hemiparesthesia will also need to be followed up upon. 2. Follow-up with Rhonda Hillier, MD in 2 weeks. EEG results will need to be followed up upon.   Discharge Diagnoses:  Principal Problem:   Hemiparesthesia: Transient right sided hemiparesthesia  Active Problems:   GERD without esophagitis   TIA (transient ischemic attack)   Right facial numbness   Numbness and tingling of right upper extremity   Hyperlipidemia   Discharge Condition: Stable and improved  Diet recommendation: Heart healthy  Filed Weights   06/17/16 1844 06/17/16 1847  Weight: 74.6 kg (164 lb 7.4 oz) 74.6 kg (164 lb 7.4 oz)    History of present illness:  Per Rhonda Norris is a 65 y.o. female who presented to the ED with c/o intermittent R sided face and arm numbness off and on over the past 2 days. BP elevated today at 167/75. No known PMH of HTN previously. Saw neurologist as outpatient who put her on ASA 47 which she took earlier today. EMS called today for persistently reoccurring symptoms.  ED Course: Seen by neurology who recs admit and work up as well as ASA 325+plavix.  Hospital Course:  #1 transient right-sided hemiparesthesis (facial numbness/right upper extremity numbness and tingling) with headache-slightly secondary to complicated migraine versus anxiety TIA cannot be completely ruled out. Differential of patient's symptoms include recurrent TIAs versus complex migraines versus seizure disorder. Patient was hospitalized and stroke workup done. Patient initially denied headaches however did tell  neurologist she also did have some headaches as well. MRI/MRA of the head done was negative for any acute infarct. Patient with resolution of deficits on presentation to the ED and as such TPA was not administered. Stroke workup was done including carotid Dopplers which did not show any significant ICA stenosis. 2-D echo showed a normal LV systolic function of 60-65% with no wall motion abnormalities, grade 1 diastolic dysfunction, sclerotic aortic valve with mild AI with no source of emboli. Fasting lipid panel with LDL of 100. Hemoglobin A1c was 6.7. EEG was done the results were pending at the time of discharge. Patient was started on a statin. Neurology consulted to follow the patient throughout the hospitalization. Aspirin was changed to Plavix. Neurology recommendations. Patient did not have any further episodes from the hospitalization patient is discharged home in stable and improved condition. Patient will follow-up with her neurologist Rhonda. Lucia Norris 1 month postdischarge. EEG results will need to be followed up upon.   #2 hyperlipidemia LDL of 100. Patient started on Zocor 10 mg daily. Goal LDL less than 70. Outpatient follow-up  #3 elevated blood pressure Improved. Follow.  Procedures:  MRI brain MRI head 06/17/2016  Chest x-ray 06/17/2016  EEG 06/20/2016  Carotid Dopplers 06/19/2016  2-D echo 06/19/2016  Consultations: Neurology: Rhonda Norris  Discharge Exam: Filed Vitals:   06/20/16 1340 06/20/16 1346  BP: 125/74 123/75  Pulse: 86 86  Temp: 98.1 F (36.7 C) 98.1 F (36.7 C)  Resp: 20 20    General: NAD Cardiovascular: RRR Respiratory: CTAB  Discharge Instructions   Discharge Instructions    Ambulatory referral to Neurology    Complete by:  As directed  An appointment is requested in approximately: 4 weeks with Rhonda Norris whom she has seen previously. Rhonda Norris saw in the hospital. MRI neg. Suspect possible migrane or anxiety     Diet - low sodium heart  healthy    Complete by:  As directed      Discharge instructions    Complete by:  As directed   Follow up with Rhonda Norris, neurology in 1 month. Follow up with PCP in 2 weeks.     Increase activity slowly    Complete by:  As directed           Current Discharge Medication List    START taking these medications   Details  clopidogrel (PLAVIX) 75 MG tablet Take 1 tablet (75 mg total) by mouth daily. Qty: 30 tablet, Refills: 3    simvastatin (ZOCOR) 10 MG tablet Take 1 tablet (10 mg total) by mouth daily at 6 PM. Qty: 30 tablet, Refills: 3      CONTINUE these medications which have NOT CHANGED   Details  Ascorbic Acid (VITAMIN C) 500 MG CAPS Take 500 mg by mouth daily.     BIOTIN PO Take 1 tablet by mouth daily.    Black Cohosh-SoyIsoflav-Magnol (ESTROVEN MENOPAUSE RELIEF) CAPS Take 1 capsule by mouth daily.    Calcium Carbonate-Vitamin D (CALCIUM 600+D) 600-200 MG-UNIT TABS Take 1 tablet by mouth daily.     cetirizine (ZYRTEC) 10 MG tablet Take 10 mg by mouth daily.    cyanocobalamin 100 MCG tablet Take 100 mcg by mouth daily.    diphenhydrAMINE (BENADRYL) 25 MG tablet Take 25 mg by mouth every 6 (six) hours as needed for allergies.    Flaxseed, Linseed, (FLAXSEED OIL) 1000 MG CAPS Take 1,000 mg by mouth daily.     fluticasone (FLONASE) 50 MCG/ACT nasal spray Place 2 sprays into the nose daily.     Ginkgo Biloba 120 MG CAPS Take 120 mg by mouth daily.    Glucosamine HCl 1500 MG TABS Take 1,500 mg by mouth daily.     Melatonin 3 MG TABS Take 3 mg by mouth at bedtime as needed (for sleep).     Multiple Vitamin (MULTIVITAMIN WITH MINERALS) TABS tablet Take 1 tablet by mouth daily.    nortriptyline (PAMELOR) 25 MG capsule Take 25 mg by mouth at bedtime.     Omega-3 Fatty Acids (FISH OIL) 1000 MG CAPS Take 1,000 mg by mouth daily.    polyethylene glycol (MIRALAX / GLYCOLAX) packet Take 17 g by mouth daily as needed for moderate constipation.     ranitidine (ZANTAC) 150  MG tablet Take 150 mg by mouth daily as needed for heartburn.    senna-docusate (SENOKOT-S) 8.6-50 MG tablet Take 1 tablet by mouth daily.    vitamin E 100 UNIT capsule Take 100 Units by mouth daily.     ALPRAZolam (XANAX) 0.5 MG tablet Take 1 tablet 30 minutes- 1 hour before MRI. Do not drive. Can take an additional tablet. Qty: 5 tablet, Refills: 0    Cranberry 250 MG CAPS Take 250 mg by mouth daily as needed (for UTI prevention).       STOP taking these medications     aspirin EC 81 MG tablet        Allergies  Allergen Reactions  . Sulfa Antibiotics Other (See Comments)    Headaches   . Dexilant [Dexlansoprazole] Hives   Follow-up Information    Follow up with Anson FretAhern, Antonia B, MD In 1  month.   Specialty:  Neurology   Why:  Office will call you with appointment date & time   Contact information:   [address] [place] [address] [phone]       Follow up with Rhonda Hillier, MD. Schedule an appointment as soon as possible for a visit in 2 weeks.   Specialty:  Family Medicine   Contact information:   [address] [place] [address] [phone]        The results of significant diagnostics from this hospitalization (including imaging, microbiology, ancillary and laboratory) are listed below for reference.    Significant Diagnostic Studies: Dg Chest 2 View  06/17/2016  CLINICAL DATA:  Right-sided weakness 3 days. EXAM: CHEST  2 VIEW COMPARISON:  None. FINDINGS: Lungs are somewhat hypoinflated without focal consolidation or effusion. Cardiomediastinal silhouette is within normal. There is mild calcified plaque over the aortic arch. There mild degenerate changes of the spine. Anterior fusion hardware partially visualized over the cervical spine. IMPRESSION: Hypoinflation without acute cardiopulmonary disease. Electronically Signed   By: Elberta Fortis M.D.   On: 06/17/2016 20:18   Mr Brain Wo Contrast  06/17/2016  CLINICAL DATA:   65 year old female with intermittent right-sided facial and arm numbness off and on for the past 2 days. Elevated blood pressure. Subsequent encounter. EXAM: MRI HEAD WITHOUT CONTRAST MRA HEAD WITHOUT CONTRAST TECHNIQUE: Multiplanar, multiecho pulse sequences of the brain and surrounding structures were obtained without intravenous contrast. Angiographic images of the head were obtained using MRA technique without contrast. COMPARISON:  06/17/2016 head CT.  No comparison MR. FINDINGS: MRI HEAD FINDINGS No acute infarct or intracranial hemorrhage. Minimal chronic microvascular changes. No hydrocephalus. Anterior left frontal 1.2 cm calcification may represent a small meningioma without surrounding vasogenic edema. Major intracranial vascular structures are patent. Cervical spondylotic changes with narrowing ventral thecal sac C2-3 and C3-4 with minimal cord flattening C3-4 level. Cervical medullary junction unremarkable. Prominent perivascular spaces incidentally noted. No acute orbital abnormality. MRA HEAD FINDINGS Anterior circulation without medium or large size vessel significant stenosis or occlusion. Slightly prominent infundibulum without aneurysm noted. Right vertebral artery is dominant. Slight narrowing distal left vertebral artery. Slight irregularity of the basilar artery without significant narrowing. Poor delineation of the posterior inferior cerebellar arteries. Only small portion of the anterior inferior cerebellar arteries are visualized. Slight irregularity of portions of the posterior cerebral arteries without high-grade stenosis. IMPRESSION: MRI HEAD No acute infarct or intracranial hemorrhage. Minimal chronic microvascular changes. Anterior left frontal 1.2 cm calcification may represent a small meningioma without surrounding vasogenic edema. Cervical spondylotic changes with narrowing ventral thecal sac C2-3 and C3-4 with minimal cord flattening C3-4 level. MRA HEAD Anterior circulation without  medium or large size vessel significant stenosis or occlusion. Slight narrowing distal left vertebral artery. Slight irregularity of the basilar artery without significant narrowing. Poor delineation of the posterior inferior cerebellar arteries. Only small portion of the anterior inferior cerebellar arteries are visualized. Electronically Signed   By: Lacy Duverney M.D.   On: 06/17/2016 22:18   Mr Maxine Glenn Head/brain Wo Cm  06/17/2016  CLINICAL DATA:  65 year old female with intermittent right-sided facial and arm numbness off and on for the past 2 days. Elevated blood pressure. Subsequent encounter. EXAM: MRI HEAD WITHOUT CONTRAST MRA HEAD WITHOUT CONTRAST TECHNIQUE: Multiplanar, multiecho pulse sequences of the brain and surrounding structures were obtained without intravenous contrast. Angiographic images of the head were obtained using MRA technique without contrast. COMPARISON:  06/17/2016 head CT.  No  comparison MR. FINDINGS: MRI HEAD FINDINGS No acute infarct or intracranial hemorrhage. Minimal chronic microvascular changes. No hydrocephalus. Anterior left frontal 1.2 cm calcification may represent a small meningioma without surrounding vasogenic edema. Major intracranial vascular structures are patent. Cervical spondylotic changes with narrowing ventral thecal sac C2-3 and C3-4 with minimal cord flattening C3-4 level. Cervical medullary junction unremarkable. Prominent perivascular spaces incidentally noted. No acute orbital abnormality. MRA HEAD FINDINGS Anterior circulation without medium or large size vessel significant stenosis or occlusion. Slightly prominent infundibulum without aneurysm noted. Right vertebral artery is dominant. Slight narrowing distal left vertebral artery. Slight irregularity of the basilar artery without significant narrowing. Poor delineation of the posterior inferior cerebellar arteries. Only small portion of the anterior inferior cerebellar arteries are visualized. Slight  irregularity of portions of the posterior cerebral arteries without high-grade stenosis. IMPRESSION: MRI HEAD No acute infarct or intracranial hemorrhage. Minimal chronic microvascular changes. Anterior left frontal 1.2 cm calcification may represent a small meningioma without surrounding vasogenic edema. Cervical spondylotic changes with narrowing ventral thecal sac C2-3 and C3-4 with minimal cord flattening C3-4 level. MRA HEAD Anterior circulation without medium or large size vessel significant stenosis or occlusion. Slight narrowing distal left vertebral artery. Slight irregularity of the basilar artery without significant narrowing. Poor delineation of the posterior inferior cerebellar arteries. Only small portion of the anterior inferior cerebellar arteries are visualized. Electronically Signed   By: Lacy Duverney M.D.   On: 06/17/2016 22:18   Ct Head Code Stroke W/o Cm  06/17/2016  CLINICAL DATA:  Code stroke.  Difficulty speaking EXAM: CT HEAD WITHOUT CONTRAST TECHNIQUE: Contiguous axial images were obtained from the base of the skull through the vertex without intravenous contrast. COMPARISON:  None. FINDINGS: Ventricle size normal.  Cerebral volume normal for age. Negative for acute or chronic infarction. Negative for intracranial hemorrhage. No shift of the midline structures Negative calvarium. Calcific density adjacent to the inner table left frontal lobe may be a calcified meningioma or benign exostosis. IMPRESSION: No acute abnormality. These results were called by telephone at the time of interpretation on 06/17/2016 at 6:52 pm to Rhonda. Fredric Dine, who verbally acknowledged these results. Electronically Signed   By: Marlan Palau M.D.   On: 06/17/2016 18:51    Microbiology: No results found for this or any previous visit (from the past 240 hour(s)).   Labs: Basic Metabolic Panel:  Recent Labs Lab 06/17/16 1842 06/19/16 0425  NA 137  138 138  K 4.2  4.2 4.1  CL 105  106 104  CO2 22 26   GLUCOSE 159*  161* 103*  BUN CREATININE 0.73  0.70 0.72  CALCIUM 9.1 9.4   Liver Function Tests:  Recent Labs Lab 06/17/16 1842  AST 35  ALT 26  ALKPHOS 80  BILITOT 0.6  PROT 7.1  ALBUMIN 3.9   No results for input(s): LIPASE, AMYLASE in the last 168 hours. No results for input(s): AMMONIA in the last 168 hours. CBC:  Recent Labs Lab 06/17/16 1842  WBC 8.8  NEUTROABS 2.5  HGB 12.7  13.6  HCT 40.3  40.0  MCV 89.8  PLT 375   Cardiac Enzymes: No results for input(s): CKTOTAL, CKMB, CKMBINDEX, TROPONINI in the last 168 hours. BNP: BNP (last 3 results) No results for input(s): BNP in the last 8760 hours.  ProBNP (last 3 results) No results for input(s): PROBNP in the last 8760 hours.  CBG:  Recent Labs Lab 06/17/16 1846  GLUCAP 131*  SignedRamiro Harvest:  THOMPSON,DANIEL MD.  [facility] 06/20/2016, 4:41 PM

## 2016-06-20 NOTE — Telephone Encounter (Signed)
She can follow up with me after discharge thanks

## 2016-06-20 NOTE — Progress Notes (Signed)
EEG completed, results pending. 

## 2016-06-20 NOTE — Progress Notes (Signed)
ELECTROENCEPHALOGRAM REPORT  Patient: Bradly ChrisSaundra B Lashley         Age: 65 y.o.        Sex: female Referring Physician: Roda ShuttersXu Report Date:  06/20/2016        Interpreting Physician: Versie Starksimothy James Oster  History: Bradly ChrisSaundra B Plath is an 65 y.o. female admitted for an episode of dizziness and imbalance.   Indications for study:  Eval for possible seizure  Technique: This is an 18 channel routine scalp EEG performed at the bedside with bipolar and monopolar montages arranged in accordance to the international 10/20 system of electrode placement.   Description: EKG channel shows a regular rhythm with a rate of 70.  She has mildly reduced voltages diffusely. Dominant posterior rhythm consists of well-formed alpha activity of 9-11 Hz. This reacts as expected with eye opening.   No focal asymmetries are seen. No epileptiform discharges are present. No seizures are recorded.   She has a normal response to both hyperventilation and photic stimulation.   Interpretation:  This is a normal awake EEG.   Rhona Leavensimothy Oster, MD 8:27 PM

## 2016-06-20 NOTE — Telephone Encounter (Signed)
-----   Message from Anson FretAntonia B Ahern, MD sent at 06/18/2016 12:25 PM EDT ----- Hgba1c 6.7, this is in the diabetic range. She needs to concentrate on diet and exercise and discuss at next primary care appointment. 10 months ago it was 6.6 so hgba1c has worsened slightly since then. Her average glucose daily is 146 and it needs to be closer to 100 to be in the normal range. Diabetes can cause a lot of problems, even when not severe. thanks

## 2016-06-20 NOTE — Telephone Encounter (Signed)
Dr Lucia GaskinsAhern- FYI Called and spoke to daughter, Ladonna Snideshonda about lab results per Dr Lucia GaskinsAhern note. She verbalized understanding. She stated they brought her to the hospital this past Friday. She was still having sx and they were worsening. She is still in hospital being worked up. Told her to call if they need anything.

## 2016-06-20 NOTE — Progress Notes (Signed)
Pt given discharge instructions, prescriptions, and care notes. Pt verbalized understanding AEB no further questions or concerns at this time. IV was discontinued, no redness, pain, or swelling noted at this time. Telemetry discontinued and Centralized Telemetry was notified. Pt left the floor via wheelchair with staff in stable condition. 

## 2016-06-20 NOTE — Care Management Obs Status (Signed)
MEDICARE OBSERVATION STATUS NOTIFICATION   Patient Details  Name: Rhonda Norris MRN: [id number] Date of Birth: 14-Jul-1951   Medicare Observation Status Notification Given:  Yes (MRI negative)    Kermit BaloKelli F Miyah Hampshire, RN 06/20/2016, 2:50 PM

## 2016-06-22 NOTE — Telephone Encounter (Signed)
Called and spoke to pt. She stated she is feeling better since d/c'd from hospital. She denies having any more sx. Made earlier f/u per Dr Lucia GaskinsAhern request on 08/04/16 at 930am , check in 915 am.  Cx f/u that was in October. Pt verbalized understanding.

## 2016-06-22 NOTE — Telephone Encounter (Signed)
Dr Ahern- FYI 

## 2016-08-04 ENCOUNTER — Encounter: Payer: Self-pay | Admitting: Neurology

## 2016-08-04 ENCOUNTER — Ambulatory Visit (INDEPENDENT_AMBULATORY_CARE_PROVIDER_SITE_OTHER): Payer: Medicare Other | Admitting: Neurology

## 2016-08-04 VITALS — BP 142/83 | HR 85 | Ht 64.0 in | Wt 175.2 lb

## 2016-08-04 DIAGNOSIS — G458 Other transient cerebral ischemic attacks and related syndromes: Secondary | ICD-10-CM | POA: Diagnosis not present

## 2016-08-04 NOTE — Progress Notes (Signed)
[facility]    Provider:  Dr Lucia GaskinsAhern Referring Provider: Catha GosselinLittle, Kevin, MD Primary Care Physician:  Mickie HillierLITTLE,KEVIN LORNE, MD  CC:  Episode of dizziness  Interval history 08/04/2016: Patient was originally seen by me in July, shortly after our appointment she presented to the emergency room with feelings of imbalance, dizziness, right face arm numbness and tingling that resolved in 45 minutes. She was admitted to the stroke service and thoroughly evaluated. MRI of the brain showed no acute infarct or intracranial hemorrhage. MRA of the head showed no medium or large size vessel significant stenosis or occlusion. Carotid Dopplers were negative. Echocardiogram was unremarkable. EEG normal.  Diagnosed with complicated migraine versus anxiety, however TIA could not completely be ruled out. She was changed to Plavix 75 mg daily. Was encouraged to be compliant with ongoing aggressive stroke risk factor management. She was started on simvastatin for LDL 100. Reviewed the findings and workup with patient today and discussed differential. She has not had any episodes since admission. None since the hospital. She says her right arm, right leg got numb and her right side of the face "twisted". By the time she got to the hospital she improved.   LDL 100, HgbA1c 6.7,   HPI:  Rhonda Norris is a 65 y.o. female here as a referral from Dr. Clarene DukeLittle for episode of dizziness. Past medical history of pancreatitis, IBS/IBD, GERD, depression, seasonal allergic rhinitis, arthritis, fibromyalgia, pancreatitis secondary to gallstones. She got up this past Wednesday, she ws canning jelly and she had an acute onset of dizziness. She had eaten that day. She drank some orange juice. She started to stumble. She laid down on the sofa and called her son and he came over. She had to hold the wall and the room was spinning, she couldn't get up to go to the car, she called EMS and she was diagnosed with vertigo. Her  pressure was elevated to 160 systolic but she was very anxious. She still couldn't get up afterwards, he legs were wobbly and feet were not moving, no nausea or vomiting, she ate a little food and felt better. By 10pm she felt better. Dr. Clarene DukeLittle thought  It may have been a TIA. She also had a headache. The room was spinning, she couldn;t walk, no inciting events or illnesses. She was started on aspirin. She had bloodwork yesterday. This is completely new, never had it before. No personal history of strokes. Mother had strokes. No inciting events, no head trauma or illnesses or URI. She has allergies. She had eaten so not low glucose. Episode lasted 6 hours total. Getting made it worse, laying down was ok. If she moved it made it worse, she had to lay very still. No other associated symptoms, no facial drooping or problems with speech or focal weakness or sensory changes. Currently symptoms resolved. No chest pain, no palpitations, no SOB. She has had neck pain. No FHx strokes.   Reviewed notes, labs and imaging from outside physicians, which showed:   CT of the head, personally reviewed images and agree with the following: FINDINGS: Ventricle size normal. Cerebral volume normal for age.  Negative for acute or chronic infarction. Negative for intracranial hemorrhage. No shift of the midline structures  Negative calvarium. Calcific density adjacent to the inner table left frontal lobe may be a calcified meningioma or benign exostosis.  IMPRESSION: No acute abnormality   Review of Systems: Patient complains of symptoms per HPI as well as the following symptoms: Spinning sensation,  no chest pain, no shortness of breath, no fevers, no chills, no systemic signs. Pertinent negatives per HPI. All others negative.   Social History   Social History  . Marital status: Married    Spouse name: N/A  . Number of children: N/A  . Years of education: N/A   Occupational History  . Not on file.    Social History Main Topics  . Smoking status: Never Smoker  . Smokeless tobacco: Not on file  . Alcohol use No  . Drug use: No  . Sexual activity: Yes    Birth control/ protection: Surgical     Comment: 1st intercourse 43 yo-1 partner   Other Topics Concern  . Not on file   Social History Narrative  . No narrative on file    Family History  Problem Relation Age of Onset  . Heart disease Mother   . Seizures Mother   . Heart disease Brother     Past Medical History:  Diagnosis Date  . Allergic rhinitis   . Arthritis   . Carpal tunnel syndrome   . Fibromyalgia   . IBD (inflammatory bowel disease)   . Osteopenia 12/2015   T score -1.9 FRAX 3.2%/ 0.2%  . Pancreatitis, gallstone     Past Surgical History:  Procedure Laterality Date  . APPENDECTOMY    . 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 CHOLECYSTECTOMY N/A 08/17/2015   Procedure: LAPAROSCOPIC CHOLECYSTECTOMY WITH INTRAOPERATIVE CHOLANGIOGRAM;  Surgeon: Abigail Miyamoto, MD;  Location: [facility] OR;  Service: General;  Laterality: N/A;  . COLPOSCOPY    . HAND SURGERY     bilateral  . KNEE ARTHROSCOPY     rt  . NECK SURGERY    . PELVIC LAPAROSCOPY  1992/1996   endometriosis  . TUBAL LIGATION    . vaginal hysterectomy bilateral salpingo-oophorectomy  11/1996   [facility] BSO endometriosis/leiomyomata    Current Outpatient Prescriptions  Medication Sig Dispense Refill  . ALPRAZolam (XANAX) 0.5 MG tablet Take 1 tablet 30 minutes- 1 hour before MRI. Do not drive. Can take an additional tablet. 5 tablet 0  . Ascorbic Acid (VITAMIN C) 500 MG CAPS Take 500 mg by mouth daily.     Marland Kitchen BIOTIN PO Take 1 tablet by mouth daily.    . Black Cohosh-SoyIsoflav-Magnol (ESTROVEN MENOPAUSE RELIEF) CAPS Take 1 capsule by mouth daily.    . Calcium Carbonate-Vitamin D (CALCIUM 600+D) 600-200 MG-UNIT TABS Take 1 tablet by mouth daily.     . cetirizine (ZYRTEC) 10 MG tablet Take 10 mg by mouth daily.    . clopidogrel  (PLAVIX) 75 MG tablet Take 1 tablet (75 mg total) by mouth daily. 30 tablet 3  . Cranberry 250 MG CAPS Take 250 mg by mouth daily as needed (for UTI prevention).     . cyanocobalamin 100 MCG tablet Take 100 mcg by mouth daily.    . diphenhydrAMINE (BENADRYL) 25 MG tablet Take 25 mg by mouth every 6 (six) hours as needed for allergies.    . Flaxseed, Linseed, (FLAXSEED OIL) 1000 MG CAPS Take 1,000 mg by mouth daily.     . fluticasone (FLONASE) 50 MCG/ACT nasal spray Place 2 sprays into the nose daily.     . Ginkgo Biloba 120 MG CAPS Take 120 mg by mouth daily.    . Glucosamine HCl 1500 MG TABS Take 1,500 mg by mouth daily.     . Melatonin 3 MG TABS Take 3 mg by  mouth at bedtime as needed (for sleep).     . Multiple Vitamin (MULTIVITAMIN WITH MINERALS) TABS tablet Take 1 tablet by mouth daily.    . nortriptyline (PAMELOR) 25 MG capsule Take 25 mg by mouth at bedtime.     . Omega-3 Fatty Acids (FISH OIL) 1000 MG CAPS Take 1,000 mg by mouth daily.    . polyethylene glycol (MIRALAX / GLYCOLAX) packet Take 17 g by mouth daily as needed for moderate constipation.     . ranitidine (ZANTAC) 150 MG tablet Take 150 mg by mouth daily as needed for heartburn.    . senna-docusate (SENOKOT-S) 8.6-50 MG tablet Take 1 tablet by mouth daily.    . simvastatin (ZOCOR) 10 MG tablet Take 1 tablet (10 mg total) by mouth daily at 6 PM. 30 tablet 3  . vitamin E 100 UNIT capsule Take 100 Units by mouth daily.      No current facility-administered medications for this visit.     Allergies as of 08/04/2016 - Review Complete 06/17/2016  Allergen Reaction Noted  . Sulfa antibiotics Other (See Comments) 06/17/2016  . Dexilant [dexlansoprazole] Hives 01/04/2016    Vitals: There were no vitals taken for this visit. Last Weight:  Wt Readings from Last 1 Encounters:  06/17/16 164 lb 7.4 oz (74.6 kg)   Last Height:   Ht Readings from Last 1 Encounters:  06/17/16 5\' 4"  (1.626 m)       Exam: Gen: NAD, conversant,  well nourised, overweight, well groomed                     CV: RRR, no MRG. No Carotid Bruits. No peripheral edema, warm, nontender Eyes: Conjunctivae clear without exudates or hemorrhage  Neuro: Detailed Neurologic Exam  Speech:    Speech is normal; fluent and spontaneous with normal comprehension.  Cognition:    The patient is oriented to person, place, and time;     recent and remote memory intact;     language fluent;     normal attention, concentration,     fund of knowledge Cranial Nerves:    The pupils are equal, round, and reactive to light. The fundi are normal and spontaneous venous pulsations are present. Visual fields are full to finger confrontation. Extraocular movements are intact. Trigeminal sensation is intact and the muscles of mastication are normal. The face is symmetric. The palate elevates in the midline. Hearing intact. Voice is normal. Shoulder shrug is normal. The tongue has normal motion without fasciculations.   Coordination:    Normal finger to nose and heel to shin. Normal rapid alternating movements.   Gait:    Heel-toe and tandem gait are normal.   Motor Observation:    No asymmetry, no atrophy, and no involuntary movements noted. Tone:    Normal muscle tone.    Posture:    Posture is normal. normal erect    Strength:    Strength is V/V in the upper and lower limbs.      Sensation: intact to LT     Reflex Exam:  DTR's:    Deep tendon reflexes in the upper and lower extremities are normal bilaterally.   Toes:    The toes are downgoing bilaterally.   Clonus:    Clonus is absent.      Assessment/Plan:  65 year old with acute onset vertigo, also admission to [facility] for transient neurologic deficits.  Workup was negative, Diagnosed with complicated migraine versus anxiety, however TIA could not completely be  ruled out. She was changed to Plavix 75 mg daily. Was encouraged to be compliant with ongoing aggressive stroke risk factor  management. She was started on simvastatin for LDL 100.   I had a long d/w patient about her recent possible TIA, risk for recurrent TIAs, personally independently reviewed imaging studies and stroke evaluation results and answered questions.Continue Plavix and statinfor secondary stroke prevention and maintain strict control of hypertension with blood pressure goal below 130/90, diabetes with hemoglobin A1c goal below 6.5% and lipids with LDL cholesterol goal below 70 mg/dL. I also advised the patient to eat a healthy diet with plenty of whole grains, cereals, fruits and vegetables, exercise regularly and maintain ideal body weight .Followup in the future with me as needed  Cc: Dr. Royston BakeLittle  Gray Maugeri, MD  [facility] [address] [place], [place] [address]  Phone [phone] Fax [phone]  A total of 30 minutes was spent face-to-face with this patient. Over half this time was spent on counseling patient on the TIA diagnosis and different diagnostic and therapeutic options available.

## 2016-08-04 NOTE — Patient Instructions (Signed)
Remember to drink plenty of fluid, eat healthy meals and do not skip any meals. Try to eat protein with a every meal and eat a healthy snack such as fruit or nuts in between meals. Try to keep a regular sleep-wake schedule and try to exercise daily, particularly in the form of walking, 20-30 minutes a day, if you can.   As far as your medications are concerned, I would like to suggest: continue medications plavix and statin daily  As far as diagnostic testing: none  I would like to see you back as needed, sooner if we need to. Please call us with any interim questions, concerns, problems, updates or refill requests.   Our phone number is [phone]. We also have an after hours call service for urgent matters and there is a physician on-call for urgent questions. For any emergencies you know to call 911 or go to the nearest emergency room

## 2016-09-19 ENCOUNTER — Ambulatory Visit: Admitting: Neurology

## 2016-10-08 IMAGING — CT CT ABD-PELV W/ CM
2 of 5 series · 10 of 46 positions shown, 11 images · IV contrast (Iodine)
Comparison: CT abdomen pelvis -08/30/2010

CLINICAL DATA: Left-sided abdominal pain radiating to the
epigastric area. Symptoms began yesterday.

EXAM:
CT ABDOMEN AND PELVIS WITH CONTRAST
TECHNIQUE: Multidetector CT imaging of the abdomen and pelvis was performed
using the standard protocol following bolus administration of
intravenous contrast.
CONTRAST:  100mL OMNIPAQUE IOHEXOL 300 MG/ML  SOLN

[Series 201: routine, idose (2) · axial · 0.76mm/px · z∈[+156,+506]mm · 7 of 90 slices shown, 8 images]
[im 10/90  soft-tissue]
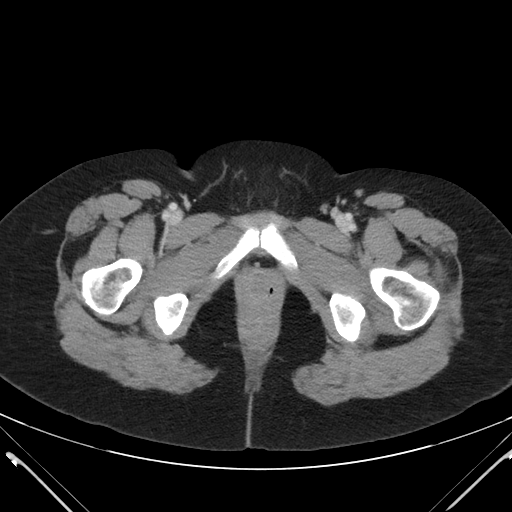
[im 10/90  bone]
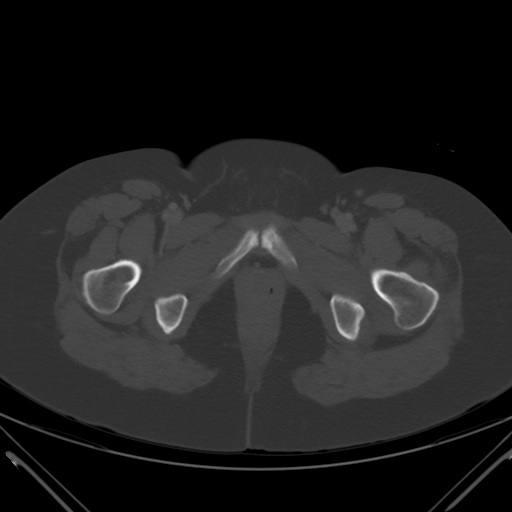
[im 19/90  soft-tissue]
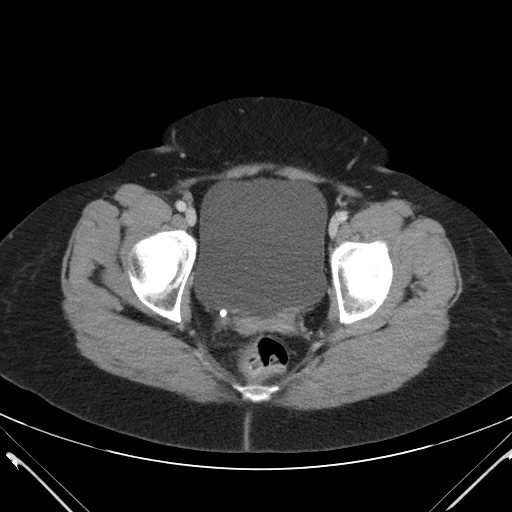
[im 33/90  soft-tissue]
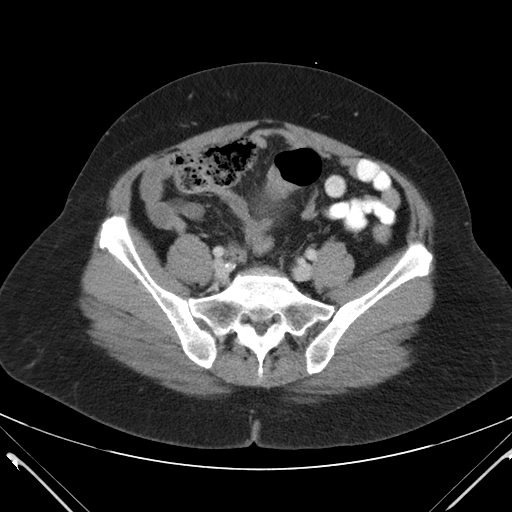
[im 47/90  soft-tissue]
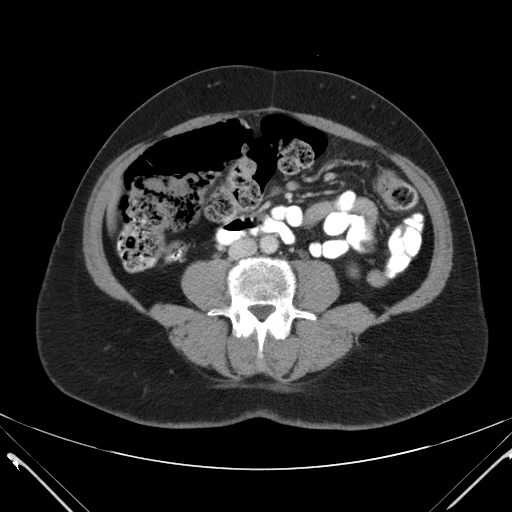
[im 57/90  soft-tissue]
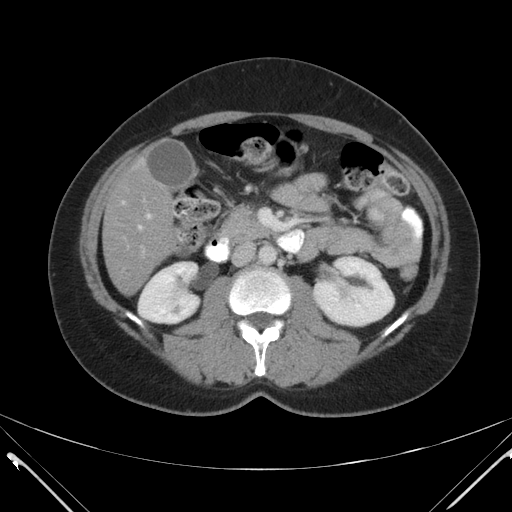
[im 71/90  soft-tissue]
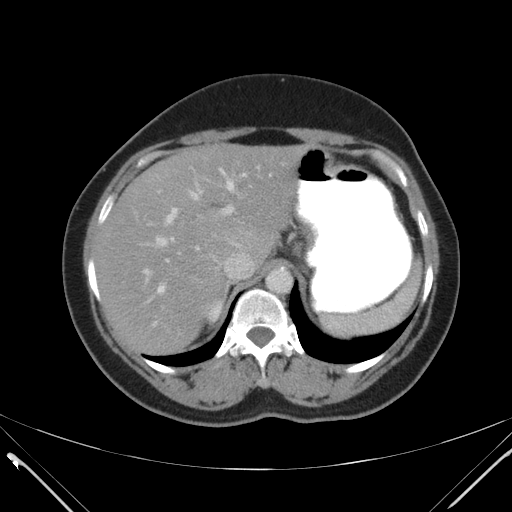
[im 80/90  soft-tissue]
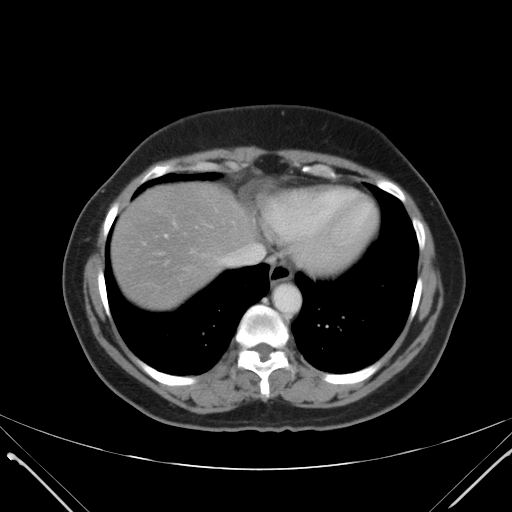

[Series 203: coronals, idose (2) · coronal · 0.45mm/px · 3 of 106 slices shown]
[im 36/106  soft-tissue]
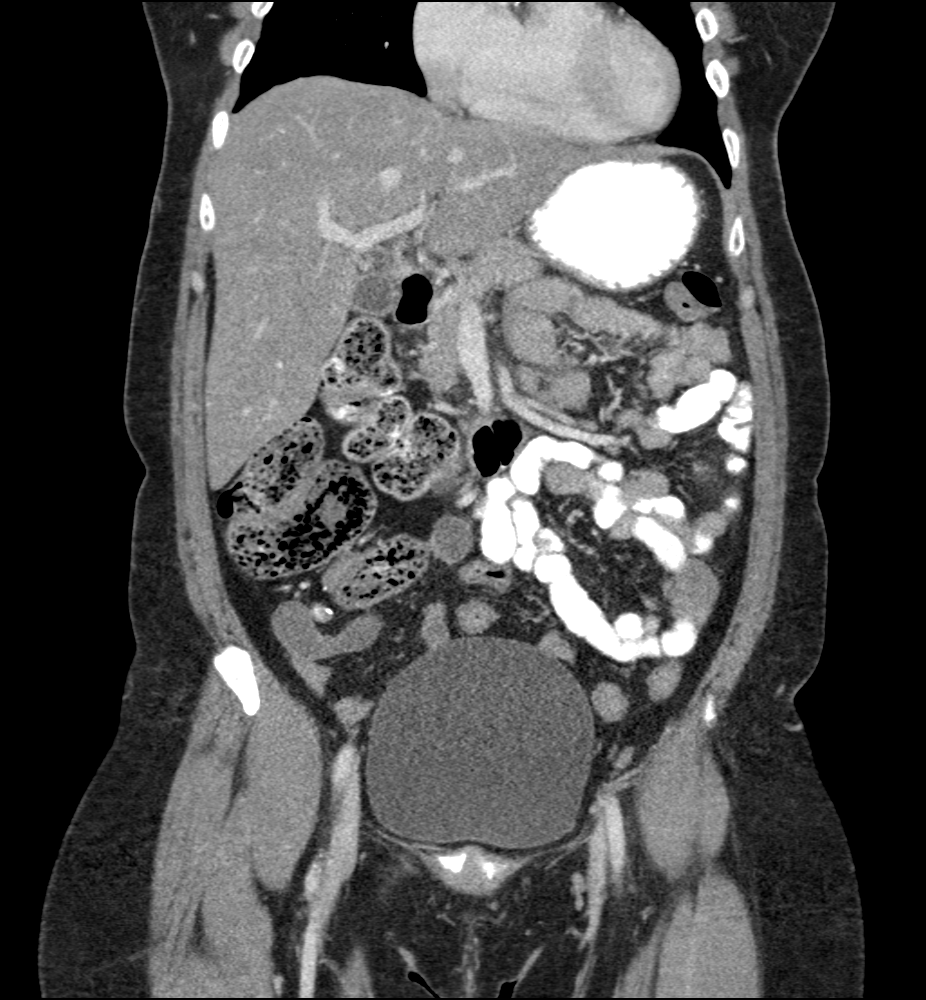
[im 47/106  soft-tissue]
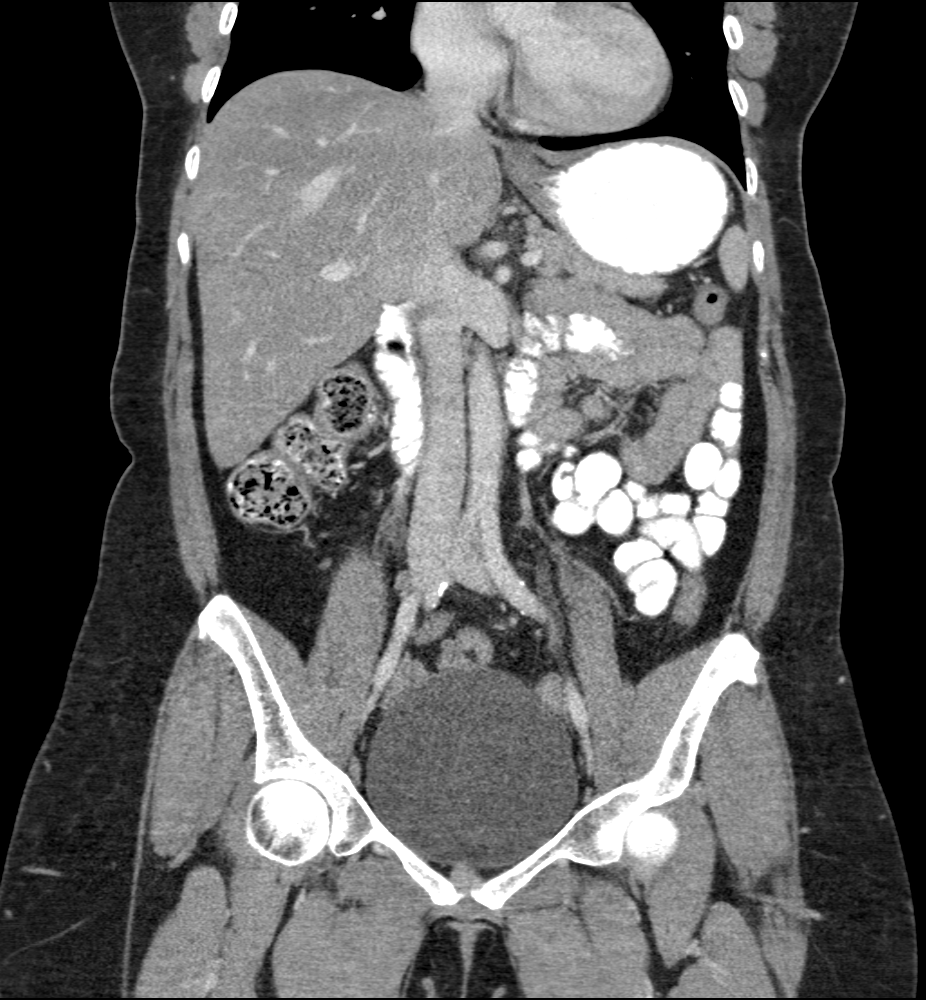
[im 59/106  soft-tissue]
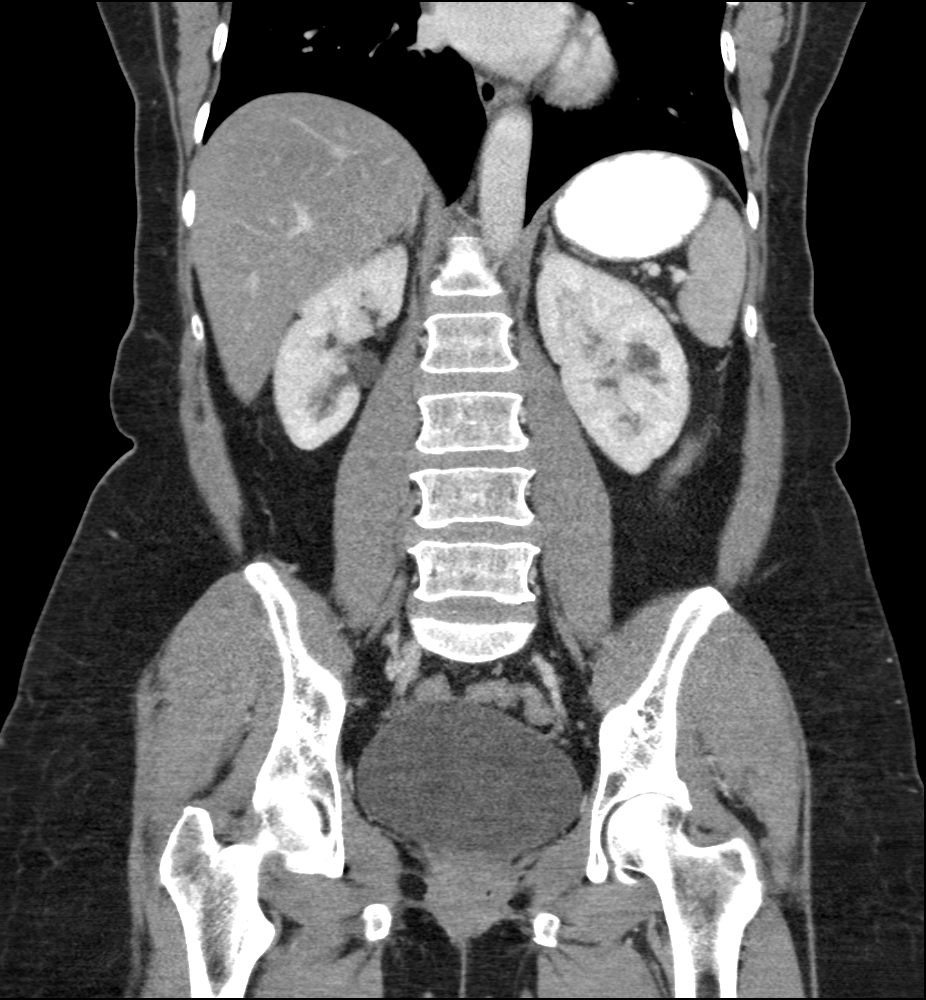

[10 of 46 positions shown; findings below may reference images not displayed]

FINDINGS: Normal hepatic contour. Punctate calcification within the left lobe
of the liver (image 25, series 201), likely the sequela of prior
granulomatous infection. No discrete hepatic lesions. Normal
appearance of the gallbladder. No radiopaque gallstones. No intra
extrahepatic biliary duct dilatation. No ascites.

There is symmetric enhancement and excretion of the bilateral
kidneys. There is apparent partial duplication of the upper poles of
the bilateral renal collecting systems, right more conspicuous than
left, with associated blunting and slightly dysmorphic appearance of
the bilateral upper pole calices without associated cortical
atrophy, unchanged to mildly progressed since the 5877 examination.
No evidence urinary obstruction or perinephric stranding. No renal
stones. Normal appearance of the bilateral adrenal glands, pancreas
and spleen.

Ingested enteric contrast extends to the level of the mid/distal
small bowel. The descending colon is underdistended. Moderate
colonic stool burden within the cecum and ascending colon. No
evidence of enteric obstruction. The appendix is not visualized
compatible with provided surgical history. No pneumoperitoneum,
pneumatosis or portal venous gas.

Scattered minimal amount of atherosclerotic plaque within a normal
caliber abdominal aorta. The major branch vessels of the abdominal
aorta appear patent on this non CTA examination.

No bulky retroperitoneal, mesenteric, pelvic or inguinal
lymphadenopathy.

Post hysterectomy. Normal appearance of the urinary bladder given
degree distention. No discrete adnexal lesion. Several phleboliths
are seen with the lower pelvis bilaterally. No free fluid within the
pelvic cul-de-sac.

Limited visualization of lower thorax demonstrates minimal dependent
subpleural ground-glass atelectasis. No discrete focal airspace
opacities. No pleural effusion.

Punctate (approximately 4 and 3 mm nodules) within the right middle
lobe (representative images 2 and 4, series 205) are unchanged since
the 5877 examination and thus of benign etiology.

Normal heart size.  No pericardial effusion.

No acute or aggressive osseous abnormalities. Mild degenerative
change of the pubic symphysis.

Regional soft tissues appear normal.
IMPRESSION: 1. No definite explanation for patient's radiating left-sided
abdominal pain.
2. Partial duplication of the bilateral upper pole renal collecting
systems with associated blunting and slightly dysmorphic appearance
of the bilateral upper lobe calices likely attributable to mild UPJ
obstruction and associated papillary necrosis, unchanged to
minimally progressed since the 5877 examination. No evidence of
acute urinary obstruction.

## 2016-11-07 ENCOUNTER — Other Ambulatory Visit: Payer: Self-pay | Admitting: Gynecology

## 2016-11-07 DIAGNOSIS — Z1231 Encounter for screening mammogram for malignant neoplasm of breast: Secondary | ICD-10-CM

## 2016-12-12 ENCOUNTER — Telehealth: Payer: Self-pay | Admitting: Neurology

## 2016-12-12 NOTE — Telephone Encounter (Signed)
Rhonda Norris, I received a note from Dr. Venita Lickahari brooks for this patient that she needs a left C5 selective nerve block and needs to be off of Plavix for 5-7 days. Would you please compose a letter stating that patient can come off her plavix, would prefer if she could stay on asa 81mg  while off of her plavix if possible and only if this does not significantly compromise her bleeding risk. There is a small risk of stroke while off of her medications. She should restart Plavix as soon as possibly after procedure thanks.

## 2016-12-14 ENCOUNTER — Encounter: Payer: Self-pay | Admitting: *Deleted

## 2016-12-14 ENCOUNTER — Ambulatory Visit
Admission: RE | Admit: 2016-12-14 | Discharge: 2016-12-14 | Disposition: A | Discharge status: Home / Self Care | Payer: Medicare Other | Source: Ambulatory Visit | Attending: Gynecology | Admitting: Gynecology

## 2016-12-14 DIAGNOSIS — Z1231 Encounter for screening mammogram for malignant neoplasm of breast: Secondary | ICD-10-CM

## 2016-12-14 NOTE — Telephone Encounter (Signed)
Dr Lucia GaskinsAhern- printed letter, awaiting your signature. Do you want it faxed to their office?

## 2016-12-14 NOTE — Telephone Encounter (Signed)
Yes please fax to his office thanks

## 2016-12-14 NOTE — Telephone Encounter (Signed)
Faxed [facility] to [facility] at [phone]. Received confirmation.

## 2016-12-16 ENCOUNTER — Other Ambulatory Visit: Payer: Self-pay | Admitting: Gynecology

## 2016-12-16 DIAGNOSIS — R928 Other abnormal and inconclusive findings on diagnostic imaging of breast: Secondary | ICD-10-CM

## 2016-12-23 ENCOUNTER — Ambulatory Visit
Admission: RE | Admit: 2016-12-23 | Discharge: 2016-12-23 | Disposition: A | Discharge status: Home / Self Care | Payer: Medicare Other | Source: Ambulatory Visit | Attending: Gynecology | Admitting: Gynecology

## 2016-12-23 ENCOUNTER — Other Ambulatory Visit: Payer: Medicare Other

## 2016-12-23 DIAGNOSIS — R928 Other abnormal and inconclusive findings on diagnostic imaging of breast: Secondary | ICD-10-CM

## 2017-01-05 ENCOUNTER — Encounter: Payer: BC Managed Care – PPO | Admitting: Gynecology

## 2017-01-26 ENCOUNTER — Ambulatory Visit (INDEPENDENT_AMBULATORY_CARE_PROVIDER_SITE_OTHER): Payer: Medicare Other | Admitting: Gynecology

## 2017-01-26 ENCOUNTER — Encounter: Payer: Self-pay | Admitting: Gynecology

## 2017-01-26 VITALS — BP 118/74 | Ht 64.0 in | Wt 176.0 lb

## 2017-01-26 DIAGNOSIS — M8580 Other specified disorders of bone density and structure, unspecified site: Secondary | ICD-10-CM

## 2017-01-26 DIAGNOSIS — N952 Postmenopausal atrophic vaginitis: Secondary | ICD-10-CM | POA: Diagnosis not present

## 2017-01-26 DIAGNOSIS — Z01411 Encounter for gynecological examination (general) (routine) with abnormal findings: Secondary | ICD-10-CM | POA: Diagnosis not present

## 2017-01-26 NOTE — Progress Notes (Signed)
    Rhonda Norris 02-25-1951 [id number]        66 y.o.  G3P3 for annual exam.    Past medical history,surgical history, problem list, medications, allergies, family history and social history were all reviewed and documented as reviewed in the EPIC chart.  ROS:  Performed with pertinent positives and negatives included in the history, assessment and plan.   Additional significant findings :  None   Exam: Kennon PortelaKim Gardner assistant Vitals:   01/26/17 0901  BP: 118/74  Weight: 176 lb (79.8 kg)  Height: 5\' 4"  (1.626 m)   Body mass index is 30.21 kg/m.  General appearance:  Normal affect, orientation and appearance. Skin: Grossly normal HEENT: Without gross lesions.  No cervical or supraclavicular adenopathy. Thyroid normal.  Lungs:  Clear without wheezing, rales or rhonchi Cardiac: RR, without RMG Abdominal:  Soft, nontender, without masses, guarding, rebound, organomegaly or hernia Breasts:  Examined lying and sitting without masses, retractions, discharge or axillary adenopathy. Pelvic:  Ext, BUS, Vagina with atrophic changes  Adnexa without masses or tenderness    Anus and perineum normal   Rectovaginal normal sphincter tone without palpated masses or tenderness.    Assessment/Plan:  66 y.o. G3P3 female for annual exam.   1. Postmenopausal/atrophic genital changes. No significant hot flushes, night sweats, vaginal dryness. Status post TVH BSO 1997 for leiomyoma and endometriosis. 2. Osteopenia.  DEXA 12/2015 T score -1.9 FRAX 3%/0.2%. We'll plan repeat DEXA never year or 2. Increased calcium vitamin D reviewed. 3. Mammography 12/2016. Continue with annual mammography when due. SBE monthly reviewed. 4. Colonoscopy 2014. Repeat at their recommended interval. 5. Pap smear 11/2014. No Pap smear done today. No history of significant abnormal Pap smears. Options to stop screening altogether based upon current recommendations and hysterectomy/age indications reviewed. Will readdress  annual basis. 6. Health maintenance. No routine lab work done as this is done elsewhere. Follow up 1 year, sooner as needed.   Dara LordsFONTAINE,[name] MD, 9:25 AM 01/26/2017

## 2017-01-26 NOTE — Patient Instructions (Signed)

## 2017-01-30 ENCOUNTER — Ambulatory Visit: Payer: Self-pay | Admitting: Physician Assistant

## 2017-02-07 NOTE — Pre-Procedure Instructions (Addendum)
    Rhonda Norris  02/07/2017      [facility] [address] - [place], [place] - [address] [place] [place] [address] Phone: [phone] Fax: [phone]    Your procedure is scheduled on Wed. Feb. 28 @ 1119 AM..  [facility] Admitting at [phone] A.M.  Call this number if you have problems the morning of surgery:  [phone]   Remember:  Do not eat food or drink liquids after midnight.  Take these medicines the morning of surgery with A SIP OF WATER cetirizine (zyrtec), diphenhydramine (benadryl), ranitidine (zantac), polyethlene glycol (miralax), simvistatin (zocor), senna-docusate (Senokot-S).  Stop taking any Vitamins or Herbal Medications. No Goody's, BC's, Aleve, Advil, Motrin, Ibuprofen or fish oil.   Do not wear jewelry, make-up or nail polish.  Do not wear lotions, powders, or perfumes, or deoderant.  Do not shave 48 hours prior to surgery.    Do not bring valuables to the hospital.  [facility] is not responsible for any belongings or valuables.  Contacts, dentures or bridgework may not be worn into surgery.  Leave your suitcase in the car.  After surgery it may be brought to your room.  For patients admitted to the hospital, discharge time will be determined by your treatment team.  Patients discharged the day of surgery will not be allowed to drive home.    Special instructions:  See attached  Please read over the following fact sheets that you were given. Pain Booklet, Coughing and Deep Breathing, MRSA Information and Surgical Site Infection Prevention

## 2017-02-08 ENCOUNTER — Encounter (HOSPITAL_COMMUNITY)
Admission: RE | Admit: 2017-02-08 | Discharge: 2017-02-08 | Disposition: A | Discharge status: Home / Self Care | Payer: Medicare Other | Source: Ambulatory Visit | Attending: Orthopedic Surgery | Admitting: Orthopedic Surgery

## 2017-02-08 ENCOUNTER — Encounter (HOSPITAL_COMMUNITY): Payer: Self-pay

## 2017-02-08 DIAGNOSIS — Z01818 Encounter for other preprocedural examination: Secondary | ICD-10-CM | POA: Insufficient documentation

## 2017-02-08 DIAGNOSIS — M50121 Cervical disc disorder at C4-C5 level with radiculopathy: Secondary | ICD-10-CM | POA: Diagnosis not present

## 2017-02-08 DIAGNOSIS — E119 Type 2 diabetes mellitus without complications: Secondary | ICD-10-CM

## 2017-02-08 HISTORY — DX: Type 2 diabetes mellitus without complications: E11.9

## 2017-02-08 HISTORY — DX: Gastro-esophageal reflux disease without esophagitis: K21.9

## 2017-02-08 LAB — BASIC METABOLIC PANEL
Anion gap: 11 (ref 5–15)
BUN: 13 mg/dL (ref 6–20)
CHLORIDE: 103 mmol/L (ref 101–111)
CO2: 25 mmol/L (ref 22–32)
CREATININE: 0.67 mg/dL (ref 0.44–1.00)
Calcium: 9.2 mg/dL (ref 8.9–10.3)
GFR calc Af Amer: >60 mL/min (ref >60)
GFR calc non Af Amer: >60 mL/min (ref >60)
Glucose, Bld: 145 mg/dL — ABNORMAL HIGH (ref 65–99)
Potassium: 4.3 mmol/L (ref 3.5–5.1)
SODIUM: 139 mmol/L (ref 135–145)

## 2017-02-08 LAB — CBC
HCT: 39.2 % (ref 36.0–46.0)
Hemoglobin: 12.5 g/dL (ref 12.0–15.0)
MCH: 28.3 pg (ref 26.0–34.0)
MCHC: 31.9 g/dL (ref 30.0–36.0)
MCV: 88.7 fL (ref 78.0–100.0)
PLATELETS: 330 10*3/uL (ref 150–400)
RBC: 4.42 MIL/uL (ref 3.87–5.11)
RDW: 13.5 % (ref 11.5–15.5)
WBC: 7.6 10*3/uL (ref 4.0–10.5)

## 2017-02-08 LAB — GLUCOSE, CAPILLARY: Glucose-Capillary: 145 mg/dL — ABNORMAL HIGH (ref 65–99)

## 2017-02-08 LAB — SURGICAL PCR SCREEN
MRSA, PCR: NEGATIVE
Staphylococcus aureus: NEGATIVE

## 2017-02-08 NOTE — Progress Notes (Addendum)
PCP: Catha GosselinKevin Little, MD  Cardiologist: pt denies  EKG: 06/2016 in EPIC  Stress test: pt denies  ECHO: 06/2016  Cardiac Cath: pt denies  Chest x-ray:

## 2017-02-09 ENCOUNTER — Telehealth: Payer: Self-pay

## 2017-02-09 LAB — HEMOGLOBIN A1C
Hgb A1c MFr Bld: 6.9 % — ABNORMAL HIGH (ref 4.8–5.6)
MEAN PLASMA GLUCOSE: 151

## 2017-02-09 NOTE — Telephone Encounter (Signed)
Sent notes to scheduling 

## 2017-02-10 ENCOUNTER — Encounter: Payer: Self-pay | Admitting: Internal Medicine

## 2017-02-13 ENCOUNTER — Ambulatory Visit (INDEPENDENT_AMBULATORY_CARE_PROVIDER_SITE_OTHER): Payer: Medicare Other | Admitting: Internal Medicine

## 2017-02-13 ENCOUNTER — Encounter: Payer: Self-pay | Admitting: Internal Medicine

## 2017-02-13 VITALS — BP 142/80 | HR 92 | Ht 64.0 in | Wt 178.8 lb

## 2017-02-13 DIAGNOSIS — E785 Hyperlipidemia, unspecified: Secondary | ICD-10-CM | POA: Diagnosis not present

## 2017-02-13 DIAGNOSIS — R739 Hyperglycemia, unspecified: Secondary | ICD-10-CM

## 2017-02-13 NOTE — Progress Notes (Signed)
Cardiology Office Note   Date:  02/13/2017   ID:  Rhonda Norris, DOB [birth year], MRN [id number]  PCP:  Rhonda HillierLITTLE,KEVIN LORNE, MD  Cardiologist:  New     Patient referred for DOE     History of Present Illness: Rhonda Norris is a 66 y.o. female with a history of HL, Type 2 DM, TIA  She is folowed by K Little  Seen in clnic on 2/21  Complained of achiness.  Also said that with 3 flights of stairs has to stop and rest   Notes increased stress   Told to stop simvistatin   LDL in June was 119  HDL wsa 62    Summer 2016   Gall bladder surgery  Post procedrue  Gky up but then better   SUmmer 2017  TIA vs seizure (dizziness, R sided numbness)  Admitted  Put on plavix  Simvistatin sttarted    Dose increased in fall  Achiness got worse   Pt noted worse breathing while on simvistatin     SInce off simvistatin achiness better but not gone  Breathing OK  No CP  No SOB now.   This weekend did yard work with breathing    Current Meds  Medication Sig  . Ascorbic Acid (VITAMIN C) 500 MG CAPS Take 500 mg by mouth daily.   Marland Kitchen. BIOTIN PO Take 1 tablet by mouth daily.  . Black Cohosh-SoyIsoflav-Magnol (ESTROVEN MENOPAUSE RELIEF) CAPS Take 1 capsule by mouth daily.  . Calcium Carbonate-Vitamin D (CALCIUM 600+D) 600-200 MG-UNIT TABS Take 1 tablet by mouth daily.   . cetirizine (ZYRTEC) 10 MG tablet Take 10 mg by mouth daily.  . clopidogrel (PLAVIX) 75 MG tablet Take 1 tablet (75 mg total) by mouth daily.  . Cranberry 250 MG CAPS Take 250 mg by mouth daily as needed (for UTI prevention).   . cyanocobalamin 100 MCG tablet Take 100 mcg by mouth daily.  . diphenhydrAMINE (BENADRYL) 25 MG tablet Take 25 mg by mouth every 6 (six) hours as needed for allergies.  . Flaxseed, Linseed, (FLAXSEED OIL) 1000 MG CAPS Take 1,000 mg by mouth daily.   . fluticasone (FLONASE) 50 MCG/ACT nasal spray Place 2 sprays into the nose daily.   . Ginkgo Biloba 120 MG CAPS Take 120 mg by mouth daily.  . Glucosamine HCl  1500 MG TABS Take 1,500 mg by mouth daily.   . Melatonin 3 MG TABS Take 3 mg by mouth at bedtime as needed (for sleep).   . Multiple Vitamin (MULTIVITAMIN WITH MINERALS) TABS tablet Take 1 tablet by mouth daily.  . naproxen sodium (ANAPROX) 220 MG tablet Take 220 mg by mouth 2 (two) times daily as needed.  . nortriptyline (PAMELOR) 25 MG capsule Take 25 mg by mouth at bedtime.   . Omega-3 Fatty Acids (FISH OIL) 1000 MG CAPS Take 1,000 mg by mouth daily.  . polyethylene glycol (MIRALAX / GLYCOLAX) packet Take 17 g by mouth daily as needed for moderate constipation.   . ranitidine (ZANTAC) 150 MG tablet Take 150 mg by mouth daily as needed for heartburn.  . senna-docusate (SENOKOT-S) 8.6-50 MG tablet Take 1 tablet by mouth daily as needed for mild constipation.   . Tetrahydrozoline HCl (VISINE OP) Apply 1 drop to eye daily.  . vitamin E 100 UNIT capsule Take 100 Units by mouth daily.      Allergies:   Sulfa antibiotics; Methocarbamol; and Dexilant [dexlansoprazole]   Past Medical History:  Diagnosis Date  . Allergic  rhinitis   . Arthritis   . Carpal tunnel syndrome   . Diabetes mellitus without complication (HCC) 02/08/2017   not on medication  . Fibromyalgia   . GERD (gastroesophageal reflux disease)   . IBD (inflammatory bowel disease)   . Osteopenia 12/2015   T score -1.9 FRAX 3.2%/ 0.2%  . Pancreatitis, gallstone   . Stroke [facility])     Past Surgical History:  Procedure Laterality Date  . APPENDECTOMY    . 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 CHOLECYSTECTOMY N/A 08/17/2015   Procedure: LAPAROSCOPIC CHOLECYSTECTOMY WITH INTRAOPERATIVE CHOLANGIOGRAM;  Surgeon: Abigail Miyamoto, MD;  Location: [facility] OR;  Service: General;  Laterality: N/A;  . COLPOSCOPY    . HAND SURGERY     bilateral  . KNEE ARTHROSCOPY     rt  . NECK SURGERY    . PELVIC LAPAROSCOPY  1992/1996   endometriosis  . TUBAL LIGATION    . vaginal hysterectomy bilateral salpingo-oophorectomy   11/1996   [facility] BSO endometriosis/leiomyomata     Social History:  The patient  reports that she has never smoked. She has never used smokeless tobacco. She reports that she does not drink alcohol or use drugs.   Family History:  The patient's family history includes Heart disease in her brother and mother; Seizures in her mother.    ROS:  Please see the history of present illness. All other systems are reviewed and  Negative to the above problem except as noted.    PHYSICAL EXAM: VS:  BP (!) 142/80   Pulse 92   Ht 5\' 4"  (1.626 m)   Wt 178 lb 12.8 oz (81.1 kg)   SpO2 95%   BMI 30.69 kg/m   GEN: Well nourished, well developed, in no acute distress  HEENT: normal  Neck: no JVD, carotid bruits, or masses Cardiac: RRR; no murmurs, rubs, or gallops,no edema  Respiratory:  clear to auscultation bilaterally, normal work of breathing GI: soft, nontender, nondistended, + BS  No hepatomegaly  MS: no deformity Moving all extremities   Skin: warm and dry, no rash Neuro:  Strength and sensation are intact Psych: euthymic mood, full affect   EKG:  EKG is ordered today. SR 70     Lipid Panel    Component Value Date/Time   CHOL 175 06/18/2016 0447   TRIG 120 06/18/2016 0447   HDL 51 06/18/2016 0447   CHOLHDL 3.4 06/18/2016 0447   VLDL 24 06/18/2016 0447   LDLCALC 100 (H) 06/18/2016 0447      Wt Readings from Last 3 Encounters:  02/13/17 178 lb 12.8 oz (81.1 kg)  02/08/17 177 lb 14.4 oz (80.7 kg)  01/26/17 176 lb (79.8 kg)      ASSESSMENT AND PLAN:  1  SOB  Imporved since stopping simvistatin  Walking up/down stairs  Worked in yard  No prblem  Follow  From a cardiac standpoint I think she is at low risk for surgery  OK to proceed without further testing   2  HL  Would stop statin  Follow   Pt says she is detinitely going to make dietary changes get more active  Disccussed CT findings of aortic plaquing  and Carotid USN findingasa of plaquing   Will plan on rechecking lipomed  panel in fall  Discuss options then   3  Hx TIA  On plavix   Current medicines are reviewed at length with the patient today.  The patient  does not have concerns regarding medicines.  Signed, Dietrich Pates, MD  02/13/2017 10:08 AM    [facility] [address] [place], [place], [place]  [address] Phone: [phone]; Fax: [phone]

## 2017-02-13 NOTE — Patient Instructions (Signed)
Your physician recommends that you continue on your current medications as directed. Please refer to the Current Medication list given to you today.  Your physician recommends that you return for lab work in: 6 months just prior to your next appointment.  Your physician wants you to follow-up in: September, 2018 with Dr. Tenny Crawoss.  You will receive a reminder letter in the mail two months in advance. If you don't receive a letter, please call our office to schedule the follow-up appointment.

## 2017-02-14 ENCOUNTER — Encounter (HOSPITAL_COMMUNITY): Payer: Self-pay | Admitting: Certified Registered Nurse Anesthetist

## 2017-02-15 ENCOUNTER — Ambulatory Visit (HOSPITAL_COMMUNITY): Payer: Medicare Other | Admitting: Certified Registered Nurse Anesthetist

## 2017-02-15 ENCOUNTER — Encounter (HOSPITAL_COMMUNITY): Payer: Self-pay | Admitting: *Deleted

## 2017-02-15 ENCOUNTER — Encounter (HOSPITAL_COMMUNITY)
Admission: RE | Disposition: A | Discharge status: Home / Self Care | Payer: Self-pay | Source: Ambulatory Visit | Attending: Orthopedic Surgery

## 2017-02-15 ENCOUNTER — Observation Stay (HOSPITAL_COMMUNITY)
Admission: RE | Admit: 2017-02-15 | Discharge: 2017-02-16 | Disposition: A | Discharge status: Home / Self Care | Payer: Medicare Other | Source: Ambulatory Visit | Attending: Orthopedic Surgery | Admitting: Orthopedic Surgery

## 2017-02-15 ENCOUNTER — Ambulatory Visit (HOSPITAL_COMMUNITY): Payer: Medicare Other

## 2017-02-15 ENCOUNTER — Observation Stay (HOSPITAL_COMMUNITY): Payer: Medicare Other

## 2017-02-15 DIAGNOSIS — M50121 Cervical disc disorder at C4-C5 level with radiculopathy: Principal | ICD-10-CM | POA: Insufficient documentation

## 2017-02-15 DIAGNOSIS — Z7951 Long term (current) use of inhaled steroids: Secondary | ICD-10-CM | POA: Diagnosis not present

## 2017-02-15 DIAGNOSIS — Z79899 Other long term (current) drug therapy: Secondary | ICD-10-CM | POA: Diagnosis not present

## 2017-02-15 DIAGNOSIS — Z981 Arthrodesis status: Secondary | ICD-10-CM | POA: Insufficient documentation

## 2017-02-15 DIAGNOSIS — Z472 Encounter for removal of internal fixation device: Secondary | ICD-10-CM | POA: Insufficient documentation

## 2017-02-15 DIAGNOSIS — Z419 Encounter for procedure for purposes other than remedying health state, unspecified: Secondary | ICD-10-CM

## 2017-02-15 DIAGNOSIS — Z8673 Personal history of transient ischemic attack (TIA), and cerebral infarction without residual deficits: Secondary | ICD-10-CM | POA: Diagnosis not present

## 2017-02-15 DIAGNOSIS — K219 Gastro-esophageal reflux disease without esophagitis: Secondary | ICD-10-CM | POA: Diagnosis not present

## 2017-02-15 DIAGNOSIS — E119 Type 2 diabetes mellitus without complications: Secondary | ICD-10-CM | POA: Insufficient documentation

## 2017-02-15 DIAGNOSIS — M542 Cervicalgia: Secondary | ICD-10-CM | POA: Diagnosis present

## 2017-02-15 HISTORY — PX: ANTERIOR CERVICAL DECOMP/DISCECTOMY FUSION: SHX1161

## 2017-02-15 LAB — GLUCOSE, CAPILLARY
GLUCOSE-CAPILLARY: 160 mg/dL — AB (ref 65–99)
Glucose-Capillary: 144 mg/dL — ABNORMAL HIGH (ref 65–99)
Glucose-Capillary: 170 mg/dL — ABNORMAL HIGH (ref 65–99)
Glucose-Capillary: 189 mg/dL — ABNORMAL HIGH (ref 65–99)

## 2017-02-15 SURGERY — ANTERIOR CERVICAL DECOMPRESSION/DISCECTOMY FUSION 1 LEVEL/HARDWARE REMOVAL
Anesthesia: General | Site: Spine Cervical

## 2017-02-15 MED ORDER — INSULIN ASPART 100 UNIT/ML ~~LOC~~ SOLN: 0.0000 [IU] | Freq: Three times a day (TID) | SUBCUTANEOUS | Status: DC

## 2017-02-15 MED ORDER — ACETAMINOPHEN 10 MG/ML IV SOLN
INTRAVENOUS | Status: AC
  Filled 2017-02-15: qty 100

## 2017-02-15 MED ORDER — HYDROMORPHONE HCL 1 MG/ML IJ SOLN
0.2500 mg | INTRAMUSCULAR | Status: DC | PRN
  Administered 2017-02-15: 0.5 mg via INTRAVENOUS

## 2017-02-15 MED ORDER — DEXAMETHASONE SODIUM PHOSPHATE 4 MG/ML IJ SOLN: 4.0000 mg | Freq: Four times a day (QID) | INTRAMUSCULAR | Status: DC

## 2017-02-15 MED ORDER — VANCOMYCIN HCL IN DEXTROSE 1-5 GM/200ML-% IV SOLN
1000.0000 mg | INTRAVENOUS | Status: AC
  Administered 2017-02-15: 1000 mg via INTRAVENOUS
  Filled 2017-02-15: qty 200

## 2017-02-15 MED ORDER — ARTIFICIAL TEARS OP OINT
TOPICAL_OINTMENT | OPHTHALMIC | Status: DC | PRN
  Administered 2017-02-15: 1 via OPHTHALMIC

## 2017-02-15 MED ORDER — SUGAMMADEX SODIUM 200 MG/2ML IV SOLN
INTRAVENOUS | Status: DC | PRN
  Administered 2017-02-15: 200 mg via INTRAVENOUS

## 2017-02-15 MED ORDER — SODIUM CHLORIDE 0.9 % IV SOLN
INTRAVENOUS | Status: DC | PRN
  Administered 2017-02-15: 13:00:00 via INTRAVENOUS

## 2017-02-15 MED ORDER — DIAZEPAM 5 MG PO TABS: 5.0000 mg | ORAL_TABLET | Freq: Three times a day (TID) | ORAL | 0 refills | Status: DC | PRN

## 2017-02-15 MED ORDER — HYDROMORPHONE HCL 1 MG/ML IJ SOLN
INTRAMUSCULAR | Status: AC
  Filled 2017-02-15: qty 0.5

## 2017-02-15 MED ORDER — SODIUM CHLORIDE 0.9% FLUSH
3.0000 mL | Freq: Two times a day (BID) | INTRAVENOUS | Status: DC
  Administered 2017-02-15: 3 mL via INTRAVENOUS

## 2017-02-15 MED ORDER — EPINEPHRINE PF 1 MG/ML IJ SOLN
INTRAMUSCULAR | Status: AC
  Filled 2017-02-15: qty 1

## 2017-02-15 MED ORDER — OXYCODONE-ACETAMINOPHEN 10-325 MG PO TABS: 1.0000 | ORAL_TABLET | ORAL | 0 refills | Status: DC | PRN

## 2017-02-15 MED ORDER — ACETAMINOPHEN 325 MG PO TABS: 650.0000 mg | ORAL_TABLET | ORAL | Status: DC | PRN

## 2017-02-15 MED ORDER — FENTANYL CITRATE (PF) 100 MCG/2ML IJ SOLN
INTRAMUSCULAR | Status: AC
  Filled 2017-02-15: qty 4

## 2017-02-15 MED ORDER — POLYETHYLENE GLYCOL 3350 17 G PO PACK: 17.0000 g | PACK | Freq: Every day | ORAL | Status: DC | PRN

## 2017-02-15 MED ORDER — LIDOCAINE HCL (CARDIAC) 20 MG/ML IV SOLN
INTRAVENOUS | Status: DC | PRN
Start: 2017-02-15 — End: 2017-02-15
  Administered 2017-02-15: 100 mg via INTRATRACHEAL

## 2017-02-15 MED ORDER — DIAZEPAM 5 MG PO TABS: 5.0000 mg | ORAL_TABLET | Freq: Four times a day (QID) | ORAL | Status: DC | PRN

## 2017-02-15 MED ORDER — NORTRIPTYLINE HCL 25 MG PO CAPS
25.0000 mg | ORAL_CAPSULE | Freq: Every day | ORAL | Status: DC
  Administered 2017-02-15: 25 mg via ORAL
  Filled 2017-02-15 (×2): qty 1

## 2017-02-15 MED ORDER — MIDAZOLAM HCL 2 MG/2ML IJ SOLN
INTRAMUSCULAR | Status: DC | PRN
  Administered 2017-02-15 (×2): 1 mg via INTRAVENOUS

## 2017-02-15 MED ORDER — DEXAMETHASONE 4 MG PO TABS
4.0000 mg | ORAL_TABLET | Freq: Four times a day (QID) | ORAL | Status: DC
  Administered 2017-02-15 – 2017-02-16 (×3): 4 mg via ORAL
  Filled 2017-02-15 (×3): qty 1

## 2017-02-15 MED ORDER — ACETAMINOPHEN 650 MG RE SUPP: 650.0000 mg | RECTAL | Status: DC | PRN

## 2017-02-15 MED ORDER — LACTATED RINGERS IV SOLN
INTRAVENOUS | Status: DC
  Administered 2017-02-15: 10:00:00 via INTRAVENOUS

## 2017-02-15 MED ORDER — DEXAMETHASONE SODIUM PHOSPHATE 10 MG/ML IJ SOLN
INTRAMUSCULAR | Status: DC | PRN
  Administered 2017-02-15: 5 mg via INTRAVENOUS

## 2017-02-15 MED ORDER — MEPERIDINE HCL 25 MG/ML IJ SOLN: 6.2500 mg | INTRAMUSCULAR | Status: DC | PRN

## 2017-02-15 MED ORDER — THROMBIN 20000 UNITS EX SOLR
CUTANEOUS | Status: AC
  Filled 2017-02-15: qty 20000

## 2017-02-15 MED ORDER — HEMOSTATIC AGENTS (NO CHARGE) OPTIME
TOPICAL | Status: DC | PRN
  Administered 2017-02-15: 1 via TOPICAL

## 2017-02-15 MED ORDER — ACETAMINOPHEN 10 MG/ML IV SOLN
1000.0000 mg | Freq: Four times a day (QID) | INTRAVENOUS | Status: DC
  Administered 2017-02-15 – 2017-02-16 (×2): 1000 mg via INTRAVENOUS
  Filled 2017-02-15 (×2): qty 100

## 2017-02-15 MED ORDER — SODIUM CHLORIDE 0.9% FLUSH: 3.0000 mL | INTRAVENOUS | Status: DC | PRN

## 2017-02-15 MED ORDER — OXYCODONE HCL 5 MG PO TABS
10.0000 mg | ORAL_TABLET | ORAL | Status: DC | PRN
  Administered 2017-02-15: 10 mg via ORAL
  Filled 2017-02-15 (×2): qty 2

## 2017-02-15 MED ORDER — LACTATED RINGERS IV SOLN: INTRAVENOUS | Status: DC

## 2017-02-15 MED ORDER — PROPOFOL 10 MG/ML IV BOLUS
INTRAVENOUS | Status: AC
  Filled 2017-02-15: qty 20

## 2017-02-15 MED ORDER — DEXAMETHASONE SODIUM PHOSPHATE 10 MG/ML IJ SOLN
INTRAMUSCULAR | Status: AC
  Filled 2017-02-15: qty 1

## 2017-02-15 MED ORDER — ONDANSETRON HCL 4 MG PO TABS: 4.0000 mg | ORAL_TABLET | Freq: Three times a day (TID) | ORAL | 0 refills | Status: DC | PRN

## 2017-02-15 MED ORDER — PROPOFOL 1000 MG/100ML IV EMUL
INTRAVENOUS | Status: AC
  Filled 2017-02-15: qty 100

## 2017-02-15 MED ORDER — ACETAMINOPHEN 10 MG/ML IV SOLN
INTRAVENOUS | Status: DC | PRN
  Administered 2017-02-15: 1000 mg via INTRAVENOUS

## 2017-02-15 MED ORDER — INSULIN ASPART 100 UNIT/ML ~~LOC~~ SOLN: 0.0000 [IU] | Freq: Every day | SUBCUTANEOUS | Status: DC

## 2017-02-15 MED ORDER — ONDANSETRON HCL 4 MG/2ML IJ SOLN
INTRAMUSCULAR | Status: DC | PRN
  Administered 2017-02-15: 4 mg via INTRAVENOUS

## 2017-02-15 MED ORDER — LIDOCAINE 2% (20 MG/ML) 5 ML SYRINGE
INTRAMUSCULAR | Status: AC
  Filled 2017-02-15: qty 5

## 2017-02-15 MED ORDER — ROCURONIUM BROMIDE 100 MG/10ML IV SOLN
INTRAVENOUS | Status: DC | PRN
  Administered 2017-02-15: 50 mg via INTRAVENOUS
  Administered 2017-02-15: 25 mg via INTRAVENOUS

## 2017-02-15 MED ORDER — ONDANSETRON HCL 4 MG/2ML IJ SOLN
INTRAMUSCULAR | Status: AC
  Filled 2017-02-15: qty 2

## 2017-02-15 MED ORDER — CEFAZOLIN SODIUM-DEXTROSE 2-4 GM/100ML-% IV SOLN
2.0000 g | Freq: Three times a day (TID) | INTRAVENOUS | Status: AC
  Administered 2017-02-15 – 2017-02-16 (×2): 2 g via INTRAVENOUS
  Filled 2017-02-15 (×2): qty 100

## 2017-02-15 MED ORDER — PROPOFOL 500 MG/50ML IV EMUL
INTRAVENOUS | Status: DC | PRN
  Administered 2017-02-15: 25 ug/kg/min via INTRAVENOUS

## 2017-02-15 MED ORDER — ONDANSETRON HCL 4 MG/2ML IJ SOLN: 4.0000 mg | INTRAMUSCULAR | Status: DC | PRN

## 2017-02-15 MED ORDER — PROPOFOL 10 MG/ML IV BOLUS
INTRAVENOUS | Status: DC | PRN
  Administered 2017-02-15: 130 mg via INTRAVENOUS

## 2017-02-15 MED ORDER — ONDANSETRON HCL 4 MG/2ML IJ SOLN: 4.0000 mg | Freq: Once | INTRAMUSCULAR | Status: DC | PRN

## 2017-02-15 MED ORDER — BUPIVACAINE-EPINEPHRINE 0.25% -1:200000 IJ SOLN
INTRAMUSCULAR | Status: DC | PRN
  Administered 2017-02-15: 4 mL

## 2017-02-15 MED ORDER — MENTHOL 3 MG MT LOZG: 1.0000 | LOZENGE | OROMUCOSAL | Status: DC | PRN

## 2017-02-15 MED ORDER — GLYCOPYRROLATE 0.2 MG/ML IV SOSY
PREFILLED_SYRINGE | INTRAVENOUS | Status: DC | PRN
  Administered 2017-02-15: .1 mg via INTRAVENOUS

## 2017-02-15 MED ORDER — PHENYLEPHRINE HCL 10 MG/ML IJ SOLN
INTRAMUSCULAR | Status: DC | PRN
  Administered 2017-02-15: 25 ug/min via INTRAVENOUS

## 2017-02-15 MED ORDER — BUPIVACAINE HCL (PF) 0.25 % IJ SOLN
INTRAMUSCULAR | Status: AC
  Filled 2017-02-15: qty 30

## 2017-02-15 MED ORDER — PHENOL 1.4 % MT LIQD
1.0000 | OROMUCOSAL | Status: DC | PRN
  Administered 2017-02-15: 1 via OROMUCOSAL
  Filled 2017-02-15: qty 177

## 2017-02-15 MED ORDER — FENTANYL CITRATE (PF) 100 MCG/2ML IJ SOLN
INTRAMUSCULAR | Status: DC | PRN
  Administered 2017-02-15: 150 ug via INTRAVENOUS
  Administered 2017-02-15: 50 ug via INTRAVENOUS

## 2017-02-15 MED ORDER — ROCURONIUM BROMIDE 50 MG/5ML IV SOSY
PREFILLED_SYRINGE | INTRAVENOUS | Status: AC
  Filled 2017-02-15: qty 5

## 2017-02-15 MED ORDER — MIDAZOLAM HCL 2 MG/2ML IJ SOLN
INTRAMUSCULAR | Status: AC
  Filled 2017-02-15: qty 2

## 2017-02-15 SURGICAL SUPPLY — 65 items
BENZOIN TINCTURE PRP APPL 2/3 (GAUZE/BANDAGES/DRESSINGS) IMPLANT
BLADE CLIPPER SURG (BLADE) IMPLANT
BONE VIVIGEN FORMABLE 1.3CC (Bone Implant) ×3 IMPLANT
BUR EGG ELITE 4.0 (BURR) IMPLANT
BUR EGG ELITE 4.0MM (BURR)
BUR MATCHSTICK NEURO 3.0 LAGG (BURR) IMPLANT
CAGE SPNL 6D 14XMED 16X7X (Cage) ×1 IMPLANT
CANISTER SUCT 3000ML PPV (MISCELLANEOUS) ×3 IMPLANT
CLOSURE STERI-STRIP 1/2X4 (GAUZE/BANDAGES/DRESSINGS) ×1
CLSR STERI-STRIP ANTIMIC 1/2X4 (GAUZE/BANDAGES/DRESSINGS) ×2 IMPLANT
COVER SURGICAL LIGHT HANDLE (MISCELLANEOUS) ×3 IMPLANT
CRADLE DONUT ADULT HEAD (MISCELLANEOUS) ×3 IMPLANT
DRAPE C-ARM 42X72 X-RAY (DRAPES) ×3 IMPLANT
DRAPE POUCH INSTRU U-SHP 10X18 (DRAPES) ×3 IMPLANT
DRAPE SURG 17X23 STRL (DRAPES) ×3 IMPLANT
DRAPE U-SHAPE 47X51 STRL (DRAPES) ×6 IMPLANT
DRSG AQUACEL AG ADV 3.5X 4 (GAUZE/BANDAGES/DRESSINGS) ×3 IMPLANT
DRSG MEPILEX BORDER 4X4 (GAUZE/BANDAGES/DRESSINGS) IMPLANT
ELECT COATED BLADE 2.86 ST (ELECTRODE) ×3 IMPLANT
ELECT PENCIL ROCKER SW 15FT (MISCELLANEOUS) ×3 IMPLANT
ELECT REM PT RETURN 9FT ADLT (ELECTROSURGICAL) ×3
ELECTRODE REM PT RTRN 9FT ADLT (ELECTROSURGICAL) ×1 IMPLANT
FILTER STRAW FLUID ASPIR (MISCELLANEOUS) ×3 IMPLANT
FUSION TCS NANOLOCK 7MM 6DEG (Cage) ×2 IMPLANT
GLOVE BIO SURGEON STRL SZ 6.5 (GLOVE) ×2 IMPLANT
GLOVE BIO SURGEONS STRL SZ 6.5 (GLOVE) ×1
GLOVE BIOGEL PI IND STRL 6.5 (GLOVE) ×1 IMPLANT
GLOVE BIOGEL PI IND STRL 8.5 (GLOVE) ×1 IMPLANT
GLOVE BIOGEL PI INDICATOR 6.5 (GLOVE) ×2
GLOVE BIOGEL PI INDICATOR 8.5 (GLOVE) ×2
GLOVE SS BIOGEL STRL SZ 8.5 (GLOVE) ×2 IMPLANT
GLOVE SUPERSENSE BIOGEL SZ 8.5 (GLOVE) ×4
GOWN STRL REUS W/ TWL XL LVL3 (GOWN DISPOSABLE) ×2 IMPLANT
GOWN STRL REUS W/TWL 2XL LVL3 (GOWN DISPOSABLE) ×6 IMPLANT
GOWN STRL REUS W/TWL XL LVL3 (GOWN DISPOSABLE) ×4
KIT BASIN OR (CUSTOM PROCEDURE TRAY) ×3 IMPLANT
KIT ROOM TURNOVER OR (KITS) ×3 IMPLANT
NEEDLE FILTER BLUNT 18X 1/2SAF (NEEDLE) ×2
NEEDLE FILTER BLUNT 18X1 1/2 (NEEDLE) ×1 IMPLANT
NEEDLE SPNL 18GX3.5 QUINCKE PK (NEEDLE) ×3 IMPLANT
NS IRRIG 1000ML POUR BTL (IV SOLUTION) ×3 IMPLANT
PACK ORTHO CERVICAL (CUSTOM PROCEDURE TRAY) ×3 IMPLANT
PACK UNIVERSAL I (CUSTOM PROCEDURE TRAY) ×3 IMPLANT
PAD ARMBOARD 7.5X6 YLW CONV (MISCELLANEOUS) ×6 IMPLANT
PATTIES SURGICAL .25X.25 (GAUZE/BANDAGES/DRESSINGS) IMPLANT
PATTIES SURGICAL .5 X.5 (GAUZE/BANDAGES/DRESSINGS) IMPLANT
PIN DISTRACTION 14 (PIN) ×3 IMPLANT
RESTRAINT LIMB HOLDER UNIV (RESTRAINTS) ×3 IMPLANT
SCREW ENDO BONE 3.8X14MM (Screw) ×6 IMPLANT
SPONGE INTESTINAL PEANUT (DISPOSABLE) ×6 IMPLANT
SPONGE LAP 4X18 X RAY DECT (DISPOSABLE) IMPLANT
SPONGE SURGIFOAM ABS GEL 100 (HEMOSTASIS) IMPLANT
SURGIFLO W/THROMBIN 8M KIT (HEMOSTASIS) ×3 IMPLANT
SUT BONE WAX W31G (SUTURE) ×6 IMPLANT
SUT MON AB 3-0 SH 27 (SUTURE) ×2
SUT MON AB 3-0 SH27 (SUTURE) ×1 IMPLANT
SUT VIC AB 2-0 CT1 36 (SUTURE) ×3 IMPLANT
SYR CONTROL 10ML LL (SYRINGE) ×3 IMPLANT
SYR TB 1ML LUER SLIP (SYRINGE) ×3 IMPLANT
TAPE CLOTH 4X10 WHT NS (GAUZE/BANDAGES/DRESSINGS) ×3 IMPLANT
TAPE UMBILICAL COTTON 1/8X30 (MISCELLANEOUS) ×6 IMPLANT
TOWEL OR 17X24 6PK STRL BLUE (TOWEL DISPOSABLE) ×3 IMPLANT
TOWEL OR 17X26 10 PK STRL BLUE (TOWEL DISPOSABLE) ×3 IMPLANT
TRAY FOLEY W/METER SILVER 16FR (SET/KITS/TRAYS/PACK) ×3 IMPLANT
WATER STERILE IRR 1000ML POUR (IV SOLUTION) IMPLANT

## 2017-02-15 NOTE — Anesthesia Procedure Notes (Signed)
Procedure Name: Intubation Date/Time: 02/15/2017 1:45 PM Performed by: Arta BruceSSEY, KEVIN Pre-anesthesia Checklist: Patient identified, Emergency Drugs available, Suction available and Patient being monitored Patient Re-evaluated:Patient Re-evaluated prior to inductionOxygen Delivery Method: Circle system utilized Preoxygenation: Pre-oxygenation with 100% oxygen Intubation Type: IV induction Ventilation: Mask ventilation without difficulty Laryngoscope Size: Glidescope (T3 view ) Grade View: Grade I Tube type: Oral Tube size: 7.0 mm Number of attempts: 1 Airway Equipment and Method: Video-laryngoscopy Placement Confirmation: ETT inserted through vocal cords under direct vision,  positive ETCO2 and breath sounds checked- equal and bilateral Secured at: 22 cm Dental Injury: Teeth and Oropharynx as per pre-operative assessment  Difficulty Due To: Difficulty was anticipated

## 2017-02-15 NOTE — Discharge Instructions (Signed)

## 2017-02-15 NOTE — Anesthesia Preprocedure Evaluation (Signed)
Anesthesia Evaluation  Patient identified by MRN, date of birth, ID band Patient awake    Reviewed: Allergy & Precautions, NPO status , Patient's Chart, lab work & pertinent test results  Airway Mallampati: II  TM Distance: >3 FB Neck ROM: Full    Dental   Pulmonary    Pulmonary exam normal        Cardiovascular Normal cardiovascular exam     Neuro/Psych CVA    GI/Hepatic GERD  Medicated and Controlled,  Endo/Other  diabetes, Type 2  Renal/GU      Musculoskeletal   Abdominal   Peds  Hematology   Anesthesia Other Findings   Reproductive/Obstetrics                             Anesthesia Physical Anesthesia Plan  ASA: III  Anesthesia Plan: General   Post-op Pain Management:    Induction: Intravenous  Airway Management Planned: Oral ETT  Additional Equipment:   Intra-op Plan:   Post-operative Plan: Extubation in OR  Informed Consent: I have reviewed the patients History and Physical, chart, labs and discussed the procedure including the risks, benefits and alternatives for the proposed anesthesia with the patient or authorized representative who has indicated his/her understanding and acceptance.     Plan Discussed with: CRNA and Surgeon  Anesthesia Plan Comments:         Anesthesia Quick Evaluation

## 2017-02-15 NOTE — Anesthesia Postprocedure Evaluation (Signed)
Anesthesia Post Note  Patient: Rhonda Norris  Procedure(s) Performed: Procedure(s) (LRB): ACDF C4-5, removal of hardware and exploration of fusion C5-6 (N/A)  Patient location during evaluation: PACU Anesthesia Type: General Level of consciousness: sedated and patient cooperative Pain management: pain level controlled Vital Signs Assessment: post-procedure vital signs reviewed and stable Respiratory status: spontaneous breathing Cardiovascular status: stable Anesthetic complications: no       Last Vitals:  Vitals:   02/15/17 1645 02/15/17 1700  BP: (!) 147/78   Pulse: 80 82  Resp: 13 12  Temp:      Last Pain:  Vitals:   02/15/17 1645  TempSrc:   PainSc: 3                  Lewie LoronJohn Jaydn Fincher

## 2017-02-15 NOTE — H&P (Signed)
History of Present Illness  The patient is a 66 year old female who comes in today for a preoperative History and Physical. The patient is scheduled for a ACDF C4-5 to be performed by Dr. Debria Garretahari D. Shon BatonBrooks, [facility] 02/15/2017 . Please see the hospital record for complete dictated history and physical. The pt carries a diagnosis of DM type 2. She is currently manageing with diet. Otherwise hx of good health.   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)  Right arm pain (M79.601)  Cervical neuropathic pain (M54.12)  S/P cervical spinal fusion (Z98.1)  Pain of left shoulder region (M25.512)  Problems Reconciled   Allergies Methocarbamol *MUSCULOSKELETAL THERAPY AGENTS*  Penicillin V Potassium *PENICILLINS*  Sulfacetamide *CHEMICALS*  Sulfapyridine *SULFONAMIDES*  Allergies Reconciled   Family History Cerebrovascular Accident  mother Congestive Heart Failure  mother Heart Disease  brother Hypertension  brother  Social History Children  3 Illicit drug use  no Current work status  retired working full time Exercise  Exercises rarely; does running / walking Exercises weekly; does running / walking Living situation  live with spouse Marital status  married Pain Contract  no Never consumed alcohol  08/03/2015: Never consumed alcohol No history of drug/alcohol rehab  Not under pain contract  Alcohol use  never consumed alcohol Copy of Drug/Alcohol Rehab (Previously)  no Number of flights of stairs before winded  4-5 Tobacco use  08/03/2015: never smoker never smoker Drug/Alcohol Rehab (Currently)  no  Medication History  Norco (5-325MG  Tablet, 1 (one) Tablet Oral po tid, Taken starting 11/29/2016) Simvastatin (10MG  Tablet, Oral) Active. Clopidogrel Bisulfate (75MG  Tablet, Oral) Active. Fluticasone Propionate (50MCG/ACT Suspension, Nasal) Active.  (QD) Nortriptyline HCl (25MG  Capsule, Oral) Active. (QD) Medications Reconciled  Pregnancy / Birth History Pregnant  no  Past Surgical History Appendectomy  Arthroscopy of Knee  right Breast Biopsy  right Foot Surgery  right Carpal Tunnel Repair  bilateral Gallbladder Surgery  laporoscopic Hysterectomy  complete (non-cancerous)  Other Problems Fibromyalgia  Irritable bowel syndrome  Gastroesophageal Reflux Disease  Oophorectomy  bilateral  Vitals  02/10/2017 8:05 AM Weight: 175 lb Height: 64in Body Surface Area: 1.85 m Body Mass Index: 30.04 kg/m  Temp.: 97.9F(Oral)  Pulse: 84 (Regular)  BP: 133/76 (Sitting, Left Arm, Standard)  General General Appearance-Not in acute distress. Orientation-Oriented X3. Build & Nutrition-Well nourished and Well developed.  Integumentary General Characteristics Surgical Scars - anterior neck surgical scarring consistent with previous cervical surgery. Cervical Spine-Skin examination of the cervical spine is without deformity, skin lesions, lacerations or abrasions.  Chest and Lung Exam Auscultation Breath sounds - Normal and Clear.  Cardiovascular Auscultation Rhythm - Regular rate and rhythm.  Peripheral Vascular Upper Extremity Palpation - Radial pulse - Bilateral - 2+.  Neurologic Sensation Upper Extremity - Left - sensation is diminished in the upper extremity (C5 dermatome). Right - sensation is intact in the upper extremity. Reflexes Biceps Reflex - Bilateral - 2+. Brachioradialis Reflex - Bilateral - 2+. Triceps Reflex - Bilateral - 2+. Hoffman's Sign - Bilateral - Hoffman's sign not present.  Musculoskeletal Spine/Ribs/Pelvis  Cervical Spine : Inspection and Palpation - Tenderness - left cervical paraspinals tender to palpation, left trapezius tender to palpation and left deltoid tender to palpation. Strength and Tone: Strength - Deltoid - left: 4/5  Right: 5/5. Biceps - Bilateral  - 5/5. Triceps - Bilateral - 5/5. Wrist Extension - Bilateral - 5/5. Hand Grip - Bilateral - 5/5. Heel walk -  Bilateral - able to heel walk without difficulty. Toe Walk - Bilateral - able to walk on toes without difficulty. Heel-Toe Walk - Bilateral - able to heel-toe walk without difficulty. ROM - Flexion - Moderately Decreased and painful. Extension - Moderately Decreased and painful. Left Lateral Flexion - Moderately Decreased and painful. Right Lateral Flexion - Moderately Decreased and painful. Left Rotation - Moderately Decreased and painful. Right Rotation - Moderately Decreased and painful. Pain - neither flexion or extension is more painful than the other. Cervical Spine - Special Testing - axial compression test negative, cross chest impingement test negative. Non-Anatomic Signs - No non-anatomic signs present. Upper Extremity Range of Motion - No truesholder pain with IR/ER of the shoulders.  MRI from 11/28/2016 shows adjacent segment degeneration at the C4-5 level with severe left foraminal narrowing due to hard disc osteophyte with compression irritation on the C5 exiting nerve root. Clinically, she does demonstrate C5 radicular pain and weakness.  Assessment & Plan Goal Of Surgery: Discussed that goal of surgery is to reduce pain and improve function and quality of life. Patient is aware that despite all appropriate treatment that there pain and function could be the same, worse, or different.  Anterior cervical fusion: Risks of surgery include, but are not limited to: Throat pain, swallowing difficulty, hoarseness or change in voice, death, stroke, paralysis, nerve root damage/injury, bleeding, blood clots, loss of bowel/bladder control, hardware failure, or mal-position, spinal fluid leak, adjacent segment disease, non-union, need for further surgery, ongoing or worse pain, infection.  Post-operative bleeding or swelling that could require emergent surgery.   Patient with adjacent segment  disease with radicular pain and weakness.  Cleared by ENT and medical team.  Plan on removal of hardware C5/6 and ACDF at C4/5.

## 2017-02-15 NOTE — Brief Op Note (Signed)
02/15/2017  3:20 PM  PATIENT:  Rhonda Norris  66 y.o. 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  PROCEDURE:  Procedure(s): ACDF C4-5, removal of hardware and exploration of fusion C5-6 (N/A)  SURGEON:  Surgeon(s) and Role:    * Venita Lickahari Umi Mainor, MD - Primary  PHYSICIAN ASSISTANT:   ASSISTANTS: Carmen Mayo   ANESTHESIA:   general  EBL:  Total I/O In: 1000 [I.V.:1000] Out: 125 [Urine:75; Blood:50]  BLOOD ADMINISTERED:none  DRAINS: none   LOCAL MEDICATIONS USED:  MARCAINE     SPECIMEN:  No Specimen  DISPOSITION OF SPECIMEN:  N/A  COUNTS:  YES  TOURNIQUET:  * No tourniquets in log *  DICTATION: .Other Dictation: Dictation Number [id number]  PLAN OF CARE: Admit for overnight observation  PATIENT DISPOSITION:  PACU - hemodynamically stable.

## 2017-02-15 NOTE — Transfer of Care (Signed)
Immediate Anesthesia Transfer of Care Note  Patient: Rhonda Norris  Procedure(s) Performed: Procedure(s): ACDF C4-5, removal of hardware and exploration of fusion C5-6 (N/A)  Patient Location: PACU  Anesthesia Type:General  Level of Consciousness: awake, alert  and oriented  Airway & Oxygen Therapy: Patient Spontanous Breathing and Patient connected to face mask oxygen  Post-op Assessment: Report given to RN, Post -op Vital signs reviewed and stable and Patient moving all extremities X 4  Post vital signs: Reviewed and stable  Last Vitals:  Vitals:   02/15/17 0923  BP: (!) 151/87  Pulse: 83  Resp: 20  Temp: 37.1 C    Last Pain:  Vitals:   02/15/17 0923  TempSrc: Oral         Complications: No apparent anesthesia complications

## 2017-02-16 ENCOUNTER — Encounter (HOSPITAL_COMMUNITY): Payer: Self-pay | Admitting: Orthopedic Surgery

## 2017-02-16 DIAGNOSIS — M50121 Cervical disc disorder at C4-C5 level with radiculopathy: Secondary | ICD-10-CM | POA: Diagnosis not present

## 2017-02-16 LAB — HEMOGLOBIN A1C
Hgb A1c MFr Bld: 6.9 % — ABNORMAL HIGH (ref 4.8–5.6)
Mean Plasma Glucose: 151 mg/dL

## 2017-02-16 LAB — GLUCOSE, CAPILLARY: Glucose-Capillary: 189 mg/dL — ABNORMAL HIGH (ref 65–99)

## 2017-02-16 NOTE — Progress Notes (Signed)
    Subjective: 1 Day Post-Op Procedure(s) (LRB): ACDF C4-5, removal of hardware and exploration of fusion C5-6 (N/A) Patient reports pain as 0 on 0-10 scale.  Denies arm pain Denies CP or SOB.  Voiding without difficulty. Positive flatus. Denies nausea with eating.  She has been walking in hall. Objective: Vital signs in last 24 hours: Temp:  [97.9 F (36.6 C)-98.8 F (37.1 C)] 98 F (36.7 C) (03/01 0400) Pulse Rate:  [74-90] 89 (03/01 0400) Resp:  [12-25] 18 (03/01 0400) BP: (141-164)/(70-87) 151/78 (03/01 0400) SpO2:  [93 %-100 %] 94 % (03/01 0400) Weight:  [80.7 kg (178 lb)] 80.7 kg (178 lb) (02/28 0931)  Intake/Output from previous day: 02/28 0701 - 03/01 0700 In: 1000 [I.V.:1000] Out: 125 [Urine:75; Blood:50] Intake/Output this shift: No intake/output data recorded.  Labs: No results for input(s): HGB in the last 72 hours. No results for input(s): WBC, RBC, HCT, PLT in the last 72 hours. No results for input(s): NA, K, CL, CO2, BUN, CREATININE, GLUCOSE, CALCIUM in the last 72 hours. No results for input(s): LABPT, INR in the last 72 hours.  Physical Exam: Neurologically intact ABD soft Sensation intact distally Dorsiflexion/Plantar flexion intact Incision: scant drainage Compartment soft Aspen collar in place Assessment/Plan: 1 Day Post-Op Procedure(s) (LRB): ACDF C4-5, removal of hardware and exploration of fusion C5-6 (N/A) Pt may DC after cleared by PT Post op medications in chart  Mayo, Baxter Kailarmen Christina for Dr. Venita Lickahari Brooks [facility] [phone] 02/16/2017, 7:24 AM    Patient ID: Rhonda Norris, female   DOB: 10/15/1951, 66 y.o.   MRN: [id number]

## 2017-02-16 NOTE — Evaluation (Signed)
Physical Therapy Evaluation and Discharge Patient Details Name: Rhonda Norris MRN: [id number] DOB: [birth year] Today's Date: 02/16/2017   History of Present Illness  Pt is a 66 y/o female s/p C4-5 ACDF. PMH includes previous CVA and DM.   Clinical Impression  Patient evaluated by Physical Therapy with no further acute PT needs identified. All education has been completed and the patient has no further questions. Pt was supervision to min guard for all mobility for safety. Pt's daughter present and able to provide assist level required at home. Reviewed precautions with pt and family and verbalized understanding. Recommending 3-in-1 for equipment at home. No follow up recommendations.  No further skilled needs at this time. PT is signing off. Thank you for this referral. If needs change, please re-consult.      Follow Up Recommendations No PT follow up;Supervision for mobility/OOB    Equipment Recommendations  3in1 (PT)    Recommendations for Other Services       Precautions / Restrictions Precautions Precautions: Cervical Precaution Comments: Precaution handout given and reviewed with pt and daughter.  Required Braces or Orthoses: Cervical Brace Cervical Brace: Hard collar Restrictions Weight Bearing Restrictions: No      Mobility  Bed Mobility Overal bed mobility: Modified Independent             General bed mobility comments: Able to elevated HOB on home bed. Reviewed log roll technique with pt to ensure she maintains precautions.   Transfers Overall transfer level: Needs assistance Equipment used: None Transfers: Sit to/from Stand Sit to Stand: Supervision         General transfer comment: Supervision for safety. Education about avoiding use of BUE during transfer to maintain precautions.   Ambulation/Gait Ambulation/Gait assistance: Supervision Ambulation Distance (Feet): 400 Feet Assistive device: None Gait Pattern/deviations: Step-through  pattern;Decreased stride length;Narrow base of support   Gait velocity interpretation: at or above normal speed for age/gender General Gait Details: Supervision for safety. Education about appropriate assist level required at home. Slightly unsteady but demonstrated no LOB during gait training.   Stairs Stairs: Yes Stairs assistance: Min guard Stair Management: One rail Left;Alternating pattern;Forwards Number of Stairs: 10 General stair comments: Min guard for safety. Education to pt and family about appropriate assist level required at home. Educated pt about using step to pattern during stair mangement to increase safety, however, pt stated she felt steady using alternating pattern and would prefer to use that method.    Wheelchair Mobility    Modified Rankin (Stroke Patients Only)       Balance Overall balance assessment: Needs assistance Sitting-balance support: No upper extremity supported;Feet supported Sitting balance-Leahy Scale: Good     Standing balance support: No upper extremity supported;During functional activity Standing balance-Leahy Scale: Good                               Pertinent Vitals/Pain Pain Assessment: Faces Faces Pain Scale: Hurts a little bit Pain Location: Neck  Pain Descriptors / Indicators: Sore;Operative site guarding Pain Intervention(s): Limited activity within patient's tolerance;Monitored during session;Repositioned    Home Living Family/patient expects to be discharged to:: Private residence Living Arrangements: Spouse/significant other;Children Available Help at Discharge: Family;Available 24 hours/day Type of Home: House Home Access: Stairs to enter Entrance Stairs-Rails: Right Entrance Stairs-Number of Steps: 5 Home Layout: Two level Home Equipment: Cane - single point;Hand held shower head      Prior Function Level of Independence: Independent  Hand Dominance   Dominant Hand: Right     Extremity/Trunk Assessment   Upper Extremity Assessment Upper Extremity Assessment: Defer to OT evaluation    Lower Extremity Assessment Lower Extremity Assessment: Overall WFL for tasks assessed    Cervical / Trunk Assessment Cervical / Trunk Assessment: Other exceptions Cervical / Trunk Exceptions: s/p surgery  Communication   Communication: No difficulties  Cognition Arousal/Alertness: Awake/alert Behavior During Therapy: WFL for tasks assessed/performed Overall Cognitive Status: Within Functional Limits for tasks assessed                      General Comments General comments (skin integrity, edema, etc.): Educated about generalized walking program to perform at home. Education about car transfers with demonstration to ensure pt maintains precautions. Daughter present throughout session.     Exercises     Assessment/Plan    PT Assessment Patent does not need any further PT services  PT Problem List         PT Treatment Interventions      PT Goals (Current goals can be found in the Care Plan section)  Acute Rehab PT Goals Patient Stated Goal: to return home  PT Goal Formulation: With patient/family Time For Goal Achievement: 02/16/17 Potential to Achieve Goals: Good    Frequency     Barriers to discharge        Co-evaluation               End of Session Equipment Utilized During Treatment: Gait belt;Cervical collar Activity Tolerance: Patient tolerated treatment well Patient left: in chair;with call bell/phone within reach;with family/visitor present Nurse Communication: Mobility status PT Visit Diagnosis: Unsteadiness on feet (R26.81)    Functional Assessment Tool Used: AM-PAC 6 Clicks Basic Mobility;Clinical judgement Functional Limitation: Mobility: Walking and moving around Mobility: Walking and Moving Around Current Status (E4540): 0 percent impaired, limited or restricted Mobility: Walking and Moving Around Goal Status (J8119): At  least 1 percent but less than 20 percent impaired, limited or restricted Mobility: Walking and Moving Around Discharge Status [phone]): At least 1 percent but less than 20 percent impaired, limited or restricted    Time: 0733-0749 PT Time Calculation (min) (ACUTE ONLY): 16 min   Charges:   PT Evaluation $PT Eval Low Complexity: 1 Procedure     PT G Codes:   PT G-Codes **NOT FOR INPATIENT CLASS** Functional Assessment Tool Used: AM-PAC 6 Clicks Basic Mobility;Clinical judgement Functional Limitation: Mobility: Walking and moving around Mobility: Walking and Moving Around Current Status (N5621): 0 percent impaired, limited or restricted Mobility: Walking and Moving Around Goal Status (H0865): At least 1 percent but less than 20 percent impaired, limited or restricted Mobility: Walking and Moving Around Discharge Status [phone]): At least 1 percent but less than 20 percent impaired, limited or restricted     Arna Snipe 02/16/2017, 10:38 AM  Margot Chimes, PT, DPT  Acute Rehabilitation Services  Pager: [phone]

## 2017-02-16 NOTE — Progress Notes (Signed)
Discharge instructions reviewed with patient/family. RXs given. All questions answered at this time. Transport home by family.   Shantera Monts, RN   

## 2017-02-16 NOTE — Op Note (Signed)
[name]:  Rhonda Norris, Rhonda Norris              ACCOUNT NO.:  [id number]  MEDICAL RECORD NO.:  [id number]  LOCATION:                                 FACILITY:  PHYSICIAN:  Majesti Gambrell D. Shon BatonBrooks, M.D. 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  OPERATIVE PROCEDURE: 1. Anterior cervical diskectomy, fusion Rhonda Norris. 2. Removal of anterior cervical hardware, Rhonda Norris. 3. Exploration of previous fusion, Rhonda Norris.  COMPLICATIONS:  None.  CONDITION:  Stable.  FIRST ASSISTANT:  Rhonda Norris, my PA.  HISTORY:  This is a pleasant 66 year old woman, who in 2009 had an anterior cervical diskectomy for radicular arm pain.  She has done exceptionally well until recently when she started having increasing radicular left arm pain.  Imaging studies confirmed an adjacent segment disk at the Rhonda Norris level.  Attempts at conservative management failed and so we elected to proceed with surgery.  OPERATIVE NOTE:  The patient was brought to the operating room and placed supine on the operating table.  After successful induction of general anesthesia and endotracheal intubation, Rhonda Norris's, Rhonda Norris, and a Rhonda Norris were inserted and the anterior cervical spine was prepped and draped in a standard fashion.  Time-out was taken confirming patient, procedure, and all other pertinent important data.  Once this was done, a transverse incision was made starting at the midline and proceeding to the right.  Sharp dissection was carried out down to the platysma. Platysma was sharply incised.  I began dissecting along the medial border of the sternocleidomastoid into the deep cervical fascia.  I then bluntly dissected through the prevertebral fascia until I could palpate the  anterior longitudinal ligament.  Thyroid retractor was placed to retract the esophagus and the trachea, and Rhonda Norris dissectors were used to expose the plate at Rhonda Norris and the Rhonda Norris disk space.  Once this was done, I used the Rhonda Norris screw removal system to remove all 4 screws and removed the plate.  This was done without any significant complications.  All screws were intact.  There were no complications after removal of the hardware.  The fusion, itself, was explored.  It was solid.  There was no evidence of a pseudoarthrosis.  There is solid bridging bone across the disk space.  At this point, I turned my attention to the Rhonda Norris level.  I mobilized the longus colli muscles out to the level of the uncovertebral joint from the midbody of C4 down to the midbody of C5.  Self-retaining retractors were placed underneath the longus colli muscle.  The endotracheal cuff was deflated.  I expanded the retractor and then reinflated the cuff.  I then removed the excess doses from the anterior aspect of the C5 and C4 vertebral bodies and then performed an annulotomy with a 15-blade scalpel.  Using pituitary rongeurs, curettes, and Kerrison rongeurs, I removed all the disk material.  I then used a fine nerve hook to dissect through the posterior annulus and developed a plane between the PLL and the thecal  sac.  Using my 1 mm Kerrison, I resected the PLL along with the overhanging osteophytes from the inferior aspect of the Rhonda Norris vertebral bodies.  Multiple small fragments of disk material were excised as well, consistent with what was seen on the preoperative MRI. At this point, I then undercut underneath the uncovertebral joint and so I had an adequate nerve root decompression.  At this point, I was pleased with my decompression.  I rasped the endplates and made sure I had bleeding subchondral bone.  I then trialed and placed the 7 nanoLOCK Zero profile Titan intervertebral cage packed with [employer].   Two locking screws 14 mm in length, 3.8 diameter were then placed and secured according to manufacturer's standards.  Distraction pins and retractors were removed.  The distraction pin holes were sealed with bone wax and the wound was copiously irrigated with normal saline.  Hemostasis was obtained with bipolar electrocautery. Final imaging studies were satisfactory.  Hardware in good position and secured.  The trachea and esophagus were then returned to midline and the platysma was closed with interrupted 2-0 Vicryl sutures, and then the skin with 3- 0 Monocryl.  Steri-Strips and a dry dressing were applied.  The patient was extubated, transferred to the PACU without incident.  At the end of the case, all needle and sponge counts were correct.  There were no adverse intraoperative events.     Lona Six D. Shon Baton, M.D.   ______________________________ Rhonda Norris. Shon Baton, M.D.    DDB/MEDQ  D:  02/15/2017  T:  02/16/2017  Job:  [id number]

## 2017-02-16 NOTE — Evaluation (Signed)
Occupational Therapy Evaluation and Discharge Patient Details Name: Rhonda Norris MRN: [id number] DOB: 1951/04/18 Today's Date: 02/16/2017    History of Present Illness Pt is a 66 y/o female s/p C4-5 ACDF. PMH includes previous CVA and DM.    Clinical Impression   PTA Pt independent in ADL and mobility. Pt currently min A for ADL and supervision for mobility. Pt familiar with compensatory strategies from previous surgeries, please see performance level below. Pt will have good family support upon discharge. No further acute OT needs, education and assessment complete. OT to sign off at this time. Thank you for the referral.     Follow Up Recommendations  No OT follow up;Supervision/Assistance - 24 hour (initially)    Equipment Recommendations  3 in 1 bedside commode    Recommendations for Other Services       Precautions / Restrictions Precautions Precautions: Cervical Precaution Comments: Pt able to recall 3/3 precautions Required Braces or Orthoses: Cervical Brace Cervical Brace: Hard collar Restrictions Weight Bearing Restrictions: No      Mobility Bed Mobility Overal bed mobility: Modified Independent             General bed mobility comments: Pt sitting OOB in recliner when OT entered  Transfers Overall transfer level: Needs assistance Equipment used: None Transfers: Sit to/from Stand Sit to Stand: Supervision         General transfer comment: Supervision for safety. Education about avoiding use of BUE during transfer to maintain precautions.     Balance Overall balance assessment: Needs assistance Sitting-balance support: No upper extremity supported;Feet supported Sitting balance-Leahy Scale: Good     Standing balance support: No upper extremity supported;During functional activity Standing balance-Leahy Scale: Good                              ADL Overall ADL's : Needs assistance/impaired Eating/Feeding: Modified  independent;Sitting Eating/Feeding Details (indicate cue type and reason): Pt enjoyiong breakfast at the end of session, eating oatmeal Grooming: Supervision/safety Grooming Details (indicate cue type and reason): educated on cup method for brushing teeth, and setting things up on the right Upper Body Bathing: Minimal assistance;With caregiver independent assisting   Lower Body Bathing: Moderate assistance;With caregiver independent assisting   Upper Body Dressing : Minimal assistance;With caregiver independent assisting Upper Body Dressing Details (indicate cue type and reason): Pt fully dressed when OT entered room, Pt and daughter able to explain how they got dressed and compensatory strategies used.  Lower Body Dressing: Minimal assistance;With caregiver independent assisting Lower Body Dressing Details (indicate cue type and reason): Pt fully dressed when OT entered room, Pt and daughter able to explain how they got dressed and compensatory strategies used.  Toilet Transfer: Min guard;Ambulation;BSC Toilet Transfer Details (indicate cue type and reason): simulated through sit<>stand on recliner     Tub/ Shower Transfer: Walk-in shower;Supervision/safety;With caregiver independent assisting;3 in 1 Tub/Shower Transfer Details (indicate cue type and reason): educated on importance of having supervision for safety, and use of 3 in 1 as shower chair Functional mobility during ADLs: Supervision/safety General ADL Comments: Pt's daughter will be present to assist (and is willing and able) with ADLs     Vision   Vision Assessment?: No apparent visual deficits     Perception     Praxis      Pertinent Vitals/Pain Pain Assessment: 0-10 Pain Score: 4  Faces Pain Scale: Hurts a little bit Pain Location: Neck, throat Pain Descriptors /  Indicators: Sore;Operative site guarding Pain Intervention(s): Limited activity within patient's tolerance;Monitored during session;Repositioned      Hand Dominance Right   Extremity/Trunk Assessment Upper Extremity Assessment Upper Extremity Assessment: Overall WFL for tasks assessed   Lower Extremity Assessment Lower Extremity Assessment: Overall WFL for tasks assessed   Cervical / Trunk Assessment Cervical / Trunk Assessment: Other exceptions Cervical / Trunk Exceptions: s/p surgery   Communication Communication Communication: No difficulties   Cognition Arousal/Alertness: Awake/alert Behavior During Therapy: WFL for tasks assessed/performed Overall Cognitive Status: Within Functional Limits for tasks assessed                     General Comments  Daughter present for session    Exercises       Shoulder Instructions      Home Living Family/patient expects to be discharged to:: Private residence Living Arrangements: Spouse/significant other;Children Available Help at Discharge: Family;Available 24 hours/day Type of Home: House Home Access: Stairs to enter Entergy CorporationEntrance Stairs-Number of Steps: 5 Entrance Stairs-Rails: Right Home Layout: Two level Alternate Level Stairs-Number of Steps: Flight Alternate Level Stairs-Rails: Right;Left;Can reach both Bathroom Shower/Tub: Producer, television/film/videoWalk-in shower   Bathroom Toilet: Standard Bathroom Accessibility: Yes How Accessible: Accessible via walker Home Equipment: Cane - single point;Hand held shower head          Prior Functioning/Environment Level of Independence: Independent                 OT Problem List: Pain;Decreased range of motion;Decreased knowledge of use of DME or AE;Decreased knowledge of precautions      OT Treatment/Interventions:      OT Goals(Current goals can be found in the care plan section) Acute Rehab OT Goals Patient Stated Goal: to return home  OT Goal Formulation: With patient/family Time For Goal Achievement: 02/23/17 Potential to Achieve Goals: Good  OT Frequency:     Barriers to D/C:            Co-evaluation               End of Session Equipment Utilized During Treatment: Cervical collar Nurse Communication: Mobility status;Precautions (equipment needs)  Activity Tolerance: Patient tolerated treatment well Patient left: in chair;with call bell/phone within reach;with family/visitor present;Other (comment) (eating breakfast)  OT Visit Diagnosis: Unsteadiness on feet (R26.81)                ADL either performed or assessed with clinical judgement  Time: 0832-0849 OT Time Calculation (min): 17 min Charges:  OT General Charges $OT Visit: 1 Procedure OT Evaluation $OT Eval Moderate Complexity: 1 Procedure G-Codes: OT G-codes **NOT FOR INPATIENT CLASS** Functional Assessment Tool Used: AM-PAC 6 Clicks Daily Activity Functional Limitation: Self care Self Care Current Status (Z6109(G8987): At least 20 percent but less than 40 percent impaired, limited or restricted Self Care Goal Status (U0454(G8988): At least 1 percent but less than 20 percent impaired, limited or restricted Self Care Discharge Status [phone](G8989): At least 20 percent but less than 40 percent impaired, limited or restricted   Sherryl MangesLaura Kalasia Crafton OTR/L [phone]   Evern BioLaura J Jelani Trueba 02/16/2017, 10:59 AM

## 2017-02-21 NOTE — Discharge Summary (Signed)
Physician Discharge Summary  Patient ID: Rhonda Norris MRN: [id number] DOB/AGE: 12-25-1950 66 y.o.  Admit date: 02/15/2017 Discharge date: 02/21/2017  Admission Diagnoses:  Cervical DDD  Discharge Diagnoses:  Active Problems:   Neck pain   Past Medical History:  Diagnosis Date  . Allergic rhinitis   . Arthritis   . Carpal tunnel syndrome   . Diabetes mellitus without complication (HCC) 02/08/2017   not on medication  . Fibromyalgia   . GERD (gastroesophageal reflux disease)   . IBD (inflammatory bowel disease)   . Osteopenia 12/2015   T score -1.9 FRAX 3.2%/ 0.2%  . Pancreatitis, gallstone   . Stroke [facility])     Surgeries: Procedure(s): ACDF C4-5, removal of hardware and exploration of fusion C5-6 on 02/15/2017   Consultants (if any):   Discharged Condition: Improved  Hospital Course: Rhonda Norris is an 66 y.o. female who was admitted 02/15/2017 with a diagnosis of Cervical DDD and went to the operating room on 02/15/2017 and underwent the above named procedures. Post op day one pt reports no arm pain.  Pt reports incisional pain  Control on oral medications.  Pt is voiding w/o difficulty.Pt has been ambulating in the hallway.  Pt cleared by PT for DC.  She was given perioperative antibiotics:  Anti-infectives    Start     Dose/Rate Route Frequency Ordered Stop   02/15/17 1900  ceFAZolin (ANCEF) IVPB 2g/100 mL premix     2 g 200 mL/hr over 30 Minutes Intravenous Every 8 hours 02/15/17 1801 02/16/17 0321   02/15/17 0917  vancomycin (VANCOCIN) IVPB 1000 mg/200 mL premix     1,000 mg 200 mL/hr over 60 Minutes Intravenous 60 min pre-op 02/15/17 0917 02/15/17 1251    .  She was given sequential compression devices, early ambulation, and TED for DVT prophylaxis.  She benefited maximally from the hospital stay and there were no complications.    Recent vital signs:  Vitals:   02/16/17 0400 02/16/17 0759  BP: (!) 151/78 (!) 148/82  Pulse: 89 84  Resp: 18 18  Temp:  98 F (36.7 C) 98.7 F (37.1 C)    Recent laboratory studies:  Lab Results  Component Value Date   HGB 12.5 02/08/2017   HGB 12.7 06/17/2016   HGB 13.6 06/17/2016   Lab Results  Component Value Date   WBC 7.6 02/08/2017   PLT 330 02/08/2017   Lab Results  Component Value Date   INR 0.91 06/17/2016   Lab Results  Component Value Date   NA 139 02/08/2017   K 4.3 02/08/2017   CL 103 02/08/2017   CO2 25 02/08/2017   BUN 13 02/08/2017   CREATININE 0.67 02/08/2017   GLUCOSE 145 (H) 02/08/2017    Discharge Medications:   Allergies as of 02/16/2017      Reactions   Dexilant [dexlansoprazole] Hives   Sulfa Antibiotics Other (See Comments)   Headaches   Methocarbamol Nausea Only      Medication List    STOP taking these medications   naproxen sodium 220 MG tablet Commonly known as:  ANAPROX     TAKE these medications   BIOTIN PO Take 1 tablet by mouth daily.   CALCIUM 600+D 600-200 MG-UNIT Tabs Generic drug:  Calcium Carbonate-Vitamin D Take 1 tablet by mouth daily.   cetirizine 10 MG tablet Commonly known as:  ZYRTEC Take 10 mg by mouth daily.   clopidogrel 75 MG tablet Commonly known as:  PLAVIX Take 1 tablet (75  mg total) by mouth daily.   Cranberry 250 MG Caps Take 250 mg by mouth daily as needed (for UTI prevention).   cyanocobalamin 100 MCG tablet Take 100 mcg by mouth daily.   diazepam 5 MG tablet Commonly known as:  VALIUM Take 1 tablet (5 mg total) by mouth every 8 (eight) hours as needed for muscle spasms.   diphenhydrAMINE 25 MG tablet Commonly known as:  BENADRYL Take 25 mg by mouth every 6 (six) hours as needed for allergies.   ESTROVEN MENOPAUSE RELIEF Caps Take 1 capsule by mouth daily.   Fish Oil 1000 MG Caps Take 1,000 mg by mouth daily.   Flaxseed Oil 1000 MG Caps Take 1,000 mg by mouth daily.   fluticasone 50 MCG/ACT nasal spray Commonly known as:  FLONASE Place 2 sprays into the nose daily.   Ginkgo Biloba 120 MG  Caps Take 120 mg by mouth daily.   Glucosamine HCl 1500 MG Tabs Take 1,500 mg by mouth daily.   Melatonin 3 MG Tabs Take 3 mg by mouth at bedtime as needed (for sleep).   multivitamin with minerals Tabs tablet Take 1 tablet by mouth daily.   nortriptyline 25 MG capsule Commonly known as:  PAMELOR Take 25 mg by mouth at bedtime.   ondansetron 4 MG tablet Commonly known as:  ZOFRAN Take 1 tablet (4 mg total) by mouth every 8 (eight) hours as needed for nausea or vomiting.   oxyCODONE-acetaminophen 10-325 MG tablet Commonly known as:  PERCOCET Take 1 tablet by mouth every 4 (four) hours as needed for pain.   polyethylene glycol packet Commonly known as:  MIRALAX / GLYCOLAX Take 17 g by mouth daily as needed for moderate constipation.   ranitidine 150 MG tablet Commonly known as:  ZANTAC Take 150 mg by mouth daily as needed for heartburn.   senna-docusate 8.6-50 MG tablet Commonly known as:  Senokot-S Take 1 tablet by mouth daily as needed for mild constipation.   VISINE OP Apply 1 drop to eye daily.   Vitamin C 500 MG Caps Take 500 mg by mouth daily.   vitamin E 100 UNIT capsule Take 100 Units by mouth daily.       Diagnostic Studies: Dg Cervical Spine 2 Or 3 Views  Result Date: 02/15/2017 CLINICAL DATA:  Post spinal fusion EXAM: CERVICAL SPINE - 2-3 VIEW COMPARISON:  Portable exam 1617 hours compared intraoperative images of 02/15/2017 FINDINGS: Views osseous demineralization. Disc prosthesis with screws at C4-C5. Prior fusion C5-C6. Vertebral body heights maintained without fracture or subluxation. LEFT carotid vascular calcifications as well as atherosclerotic calcification at aortic arch. IMPRESSION: Anterior fusion C4-C5 and prior fusion of C5-C6. No acute abnormalities. Electronically Signed   By: Ulyses SouthwardMark  Boles M.D.   On: 02/15/2017 16:31   Dg Cervical Spine 2-3 Views  Result Date: 02/15/2017 CLINICAL DATA:  Cervical anterior fixation EXAM: CERVICAL SPINE - 2-3  VIEW; DG C-ARM 61-120 MIN COMPARISON:  None. FLUOROSCOPY TIME:  Radiation Exposure Index (as provided by the fluoroscopic device): Not available If the device does not provide the exposure index: Fluoroscopy Time:  27 seconds Number of Acquired Images:  4 FINDINGS: Initial images demonstrate a surgical instrument at the anterior aspect of the C4-5 interspace. Subsequently interbody fusion at C4-5 is noted with fixation into the vertebral bodies. IMPRESSION: C4-5 fusion Electronically Signed   By: Alcide CleverMark  Lukens M.D.   On: 02/15/2017 15:02   Dg C-arm 1-60 Min  Result Date: 02/15/2017 CLINICAL DATA:  Cervical anterior fixation  EXAM: CERVICAL SPINE - 2-3 VIEW; DG C-ARM 61-120 MIN COMPARISON:  None. FLUOROSCOPY TIME:  Radiation Exposure Index (as provided by the fluoroscopic device): Not available If the device does not provide the exposure index: Fluoroscopy Time:  27 seconds Number of Acquired Images:  4 FINDINGS: Initial images demonstrate a surgical instrument at the anterior aspect of the C4-5 interspace. Subsequently interbody fusion at C4-5 is noted with fixation into the vertebral bodies. IMPRESSION: C4-5 fusion Electronically Signed   By: Alcide Clever M.D.   On: 02/15/2017 15:02    Disposition: 01-Home or Self Care Pt will present to clinic in 2 weeks Post op medications provided  Discharge Instructions    Incentive spirometry RT    Complete by:  As directed          Signed: Kirt Boys 02/21/2017, 8:41 PM

## 2017-03-29 IMAGING — US US ABDOMEN LIMITED
1 series · 14 of 25 positions shown · non-contrast
Comparison: CT of earlier in the day

CLINICAL DATA: Elevated liver function tests.

EXAM:
US ABDOMEN LIMITED - RIGHT UPPER QUADRANT

[Series 1: us abdomen limited · 0.22mm/px · 14 of 35 slices shown]
[im 1/35]
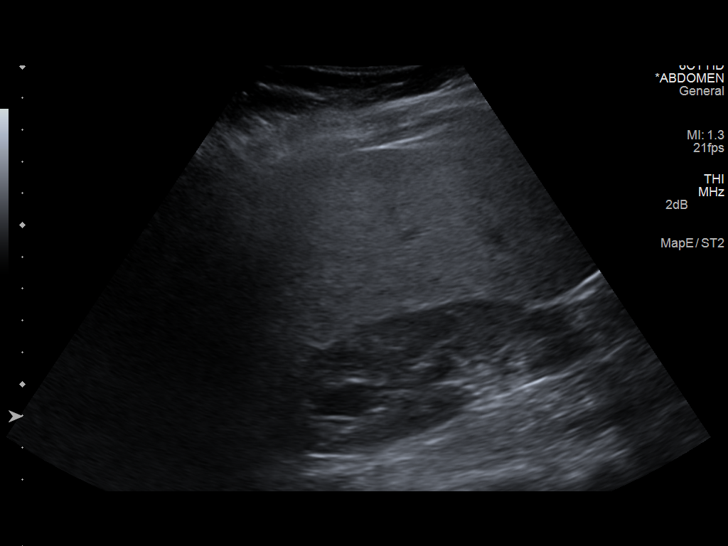
[im 3/35]
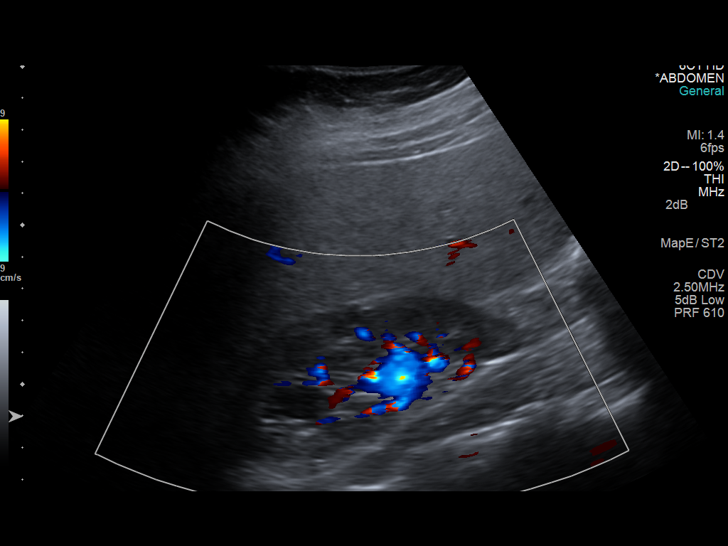
[im 6/35]
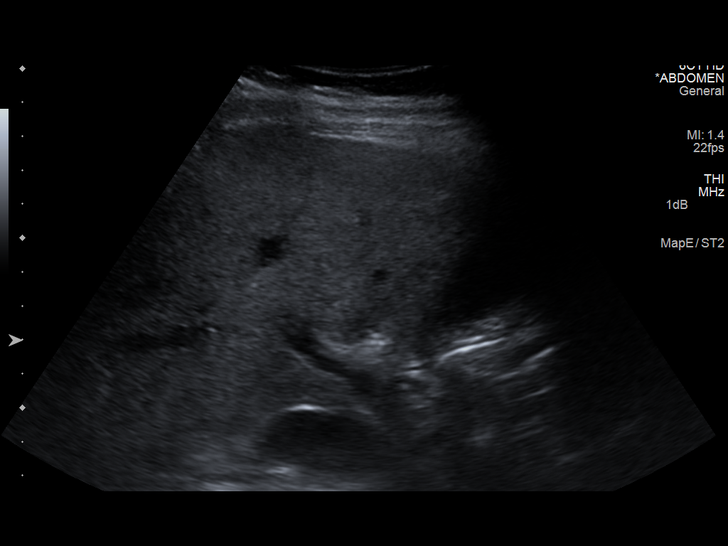
[im 9/35]
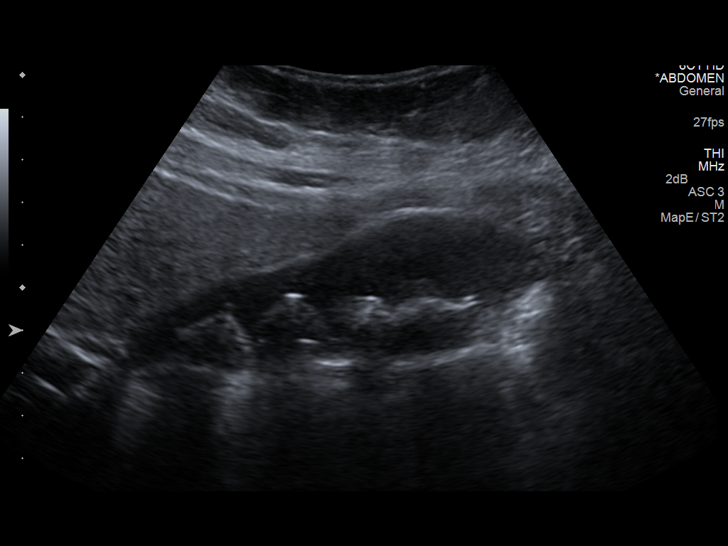
[im 12/35]
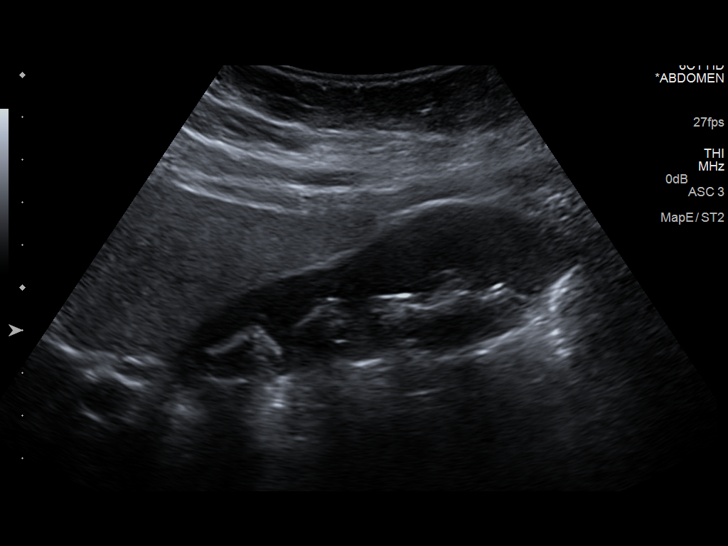
[im 13/35]
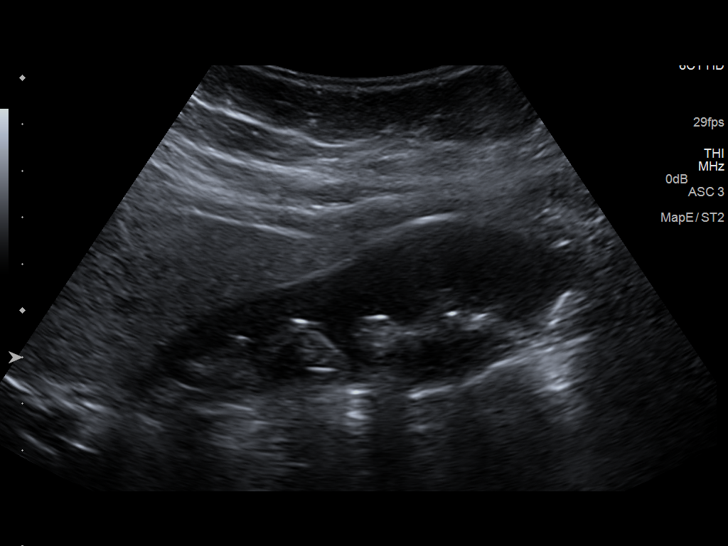
[im 16/35]
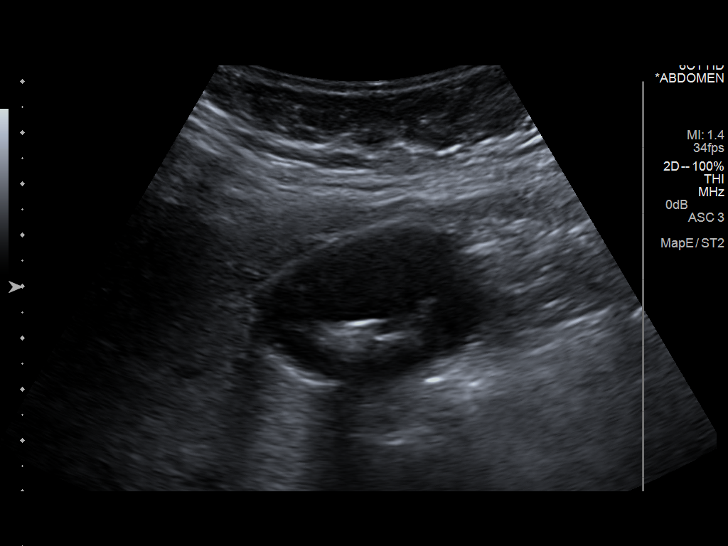
[im 19/35]
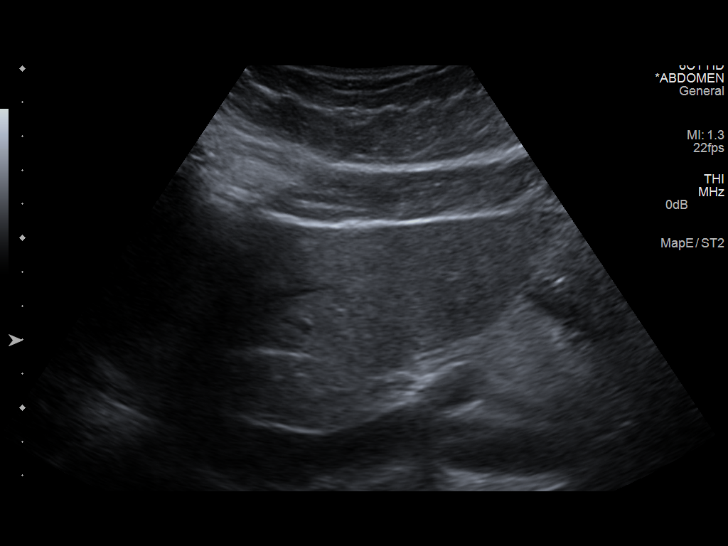
[im 22/35]
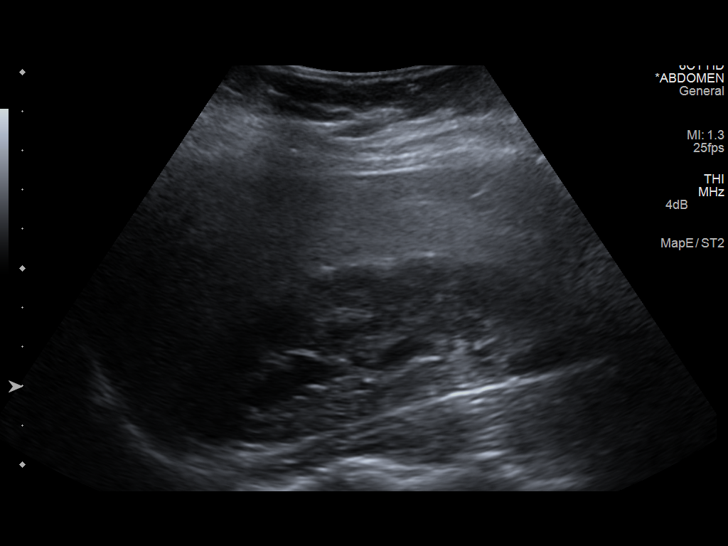
[im 23/35]
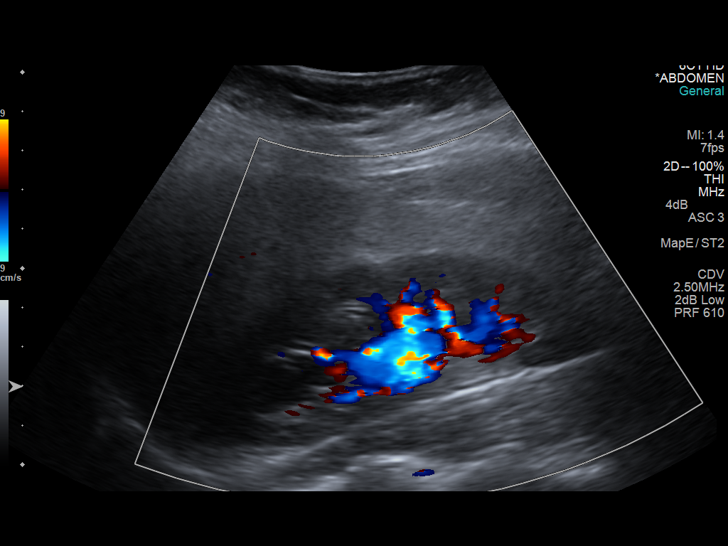
[im 26/35]
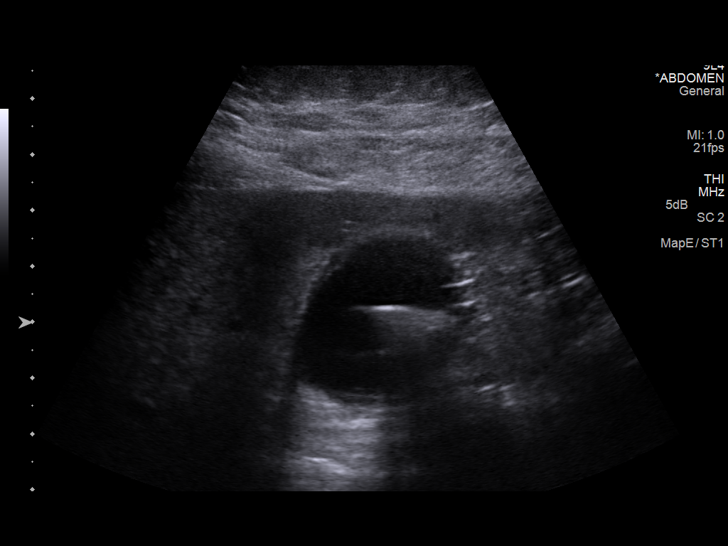
[im 29/35]
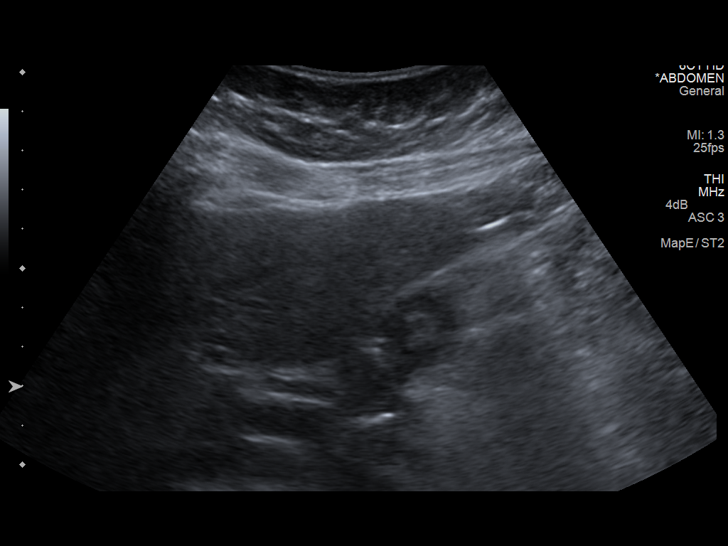
[im 32/35]
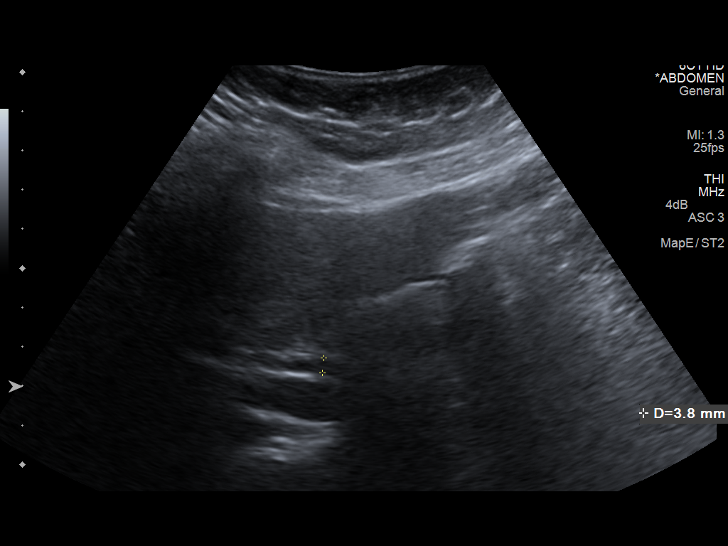
[im 35/35]
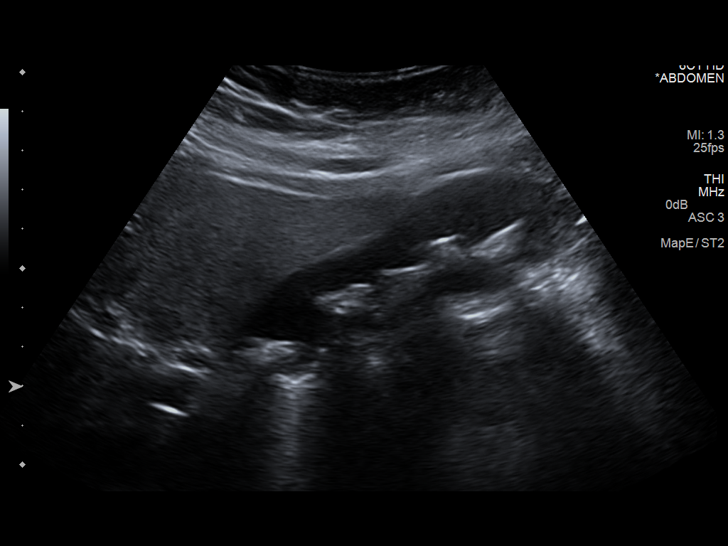

[14 of 25 positions shown; findings below may reference images not displayed]

FINDINGS: Gallbladder:

Multiple gallstones. Mild Gallbladder distension. No wall thickening
or pericholecystic fluid. Patient was pre-medicated, therefore
evaluation for sonographic Murphy's sign limited.

Common bile duct:

Diameter: Normal, 4 mm.

Liver:

Increased echogenicity, suggesting mild hepatic steatosis.

Incidental note is made of right-sided caliectasis, as described on
CT.
IMPRESSION: 1. Hepatic steatosis.
2. Cholelithiasis with gallbladder distention but no specific
evidence of acute cholecystitis. No evidence of inflammation on the
CT of earlier today.
3. No biliary duct dilatation.

## 2017-08-09 ENCOUNTER — Other Ambulatory Visit: Payer: Self-pay | Admitting: Orthopedic Surgery

## 2017-08-09 ENCOUNTER — Ambulatory Visit
Admission: RE | Admit: 2017-08-09 | Discharge: 2017-08-09 | Disposition: A | Discharge status: Home / Self Care | Payer: Medicare Other | Source: Ambulatory Visit | Attending: Orthopedic Surgery | Admitting: Orthopedic Surgery

## 2017-08-09 DIAGNOSIS — Z981 Arthrodesis status: Secondary | ICD-10-CM

## 2017-08-14 ENCOUNTER — Other Ambulatory Visit: Payer: Medicare Other | Admitting: *Deleted

## 2017-08-14 DIAGNOSIS — R739 Hyperglycemia, unspecified: Secondary | ICD-10-CM

## 2017-08-14 DIAGNOSIS — E785 Hyperlipidemia, unspecified: Secondary | ICD-10-CM

## 2017-08-15 LAB — APOLIPOPROTEIN B: APOLIPOPROTEIN B: 102 mg/dL (ref 54–133)

## 2017-08-15 LAB — HEMOGLOBIN A1C
Est. average glucose Bld gHb Est-mCnc: 206 mg/dL
Hgb A1c MFr Bld: 8.8 % — ABNORMAL HIGH (ref 4.8–5.6)

## 2017-08-15 LAB — LIPOPROTEIN A (LPA): LIPOPROTEIN (A): 180 nmol/L — AB (ref <75)

## 2017-08-15 LAB — NMR, LIPOPROFILE
Cholesterol: 189 mg/dL (ref 100–199)
HDL Cholesterol by NMR: 54 mg/dL (ref >39)
HDL PARTICLE NUMBER: 40.5 umol/L (ref >=30.5)
LDL Particle Number: 1418 nmol/L — ABNORMAL HIGH (ref <1000)
LDL SIZE: 21.6 nm (ref >20.5)
LDL-C: 104 mg/dL — ABNORMAL HIGH (ref 0–99)
LP-IR SCORE: 55 — AB (ref <=45)
SMALL LDL PARTICLE NUMBER: 454 nmol/L (ref <=527)
Triglycerides by NMR: 153 mg/dL — ABNORMAL HIGH (ref 0–149)

## 2017-08-17 ENCOUNTER — Other Ambulatory Visit: Payer: Self-pay | Admitting: *Deleted

## 2017-08-17 DIAGNOSIS — E785 Hyperlipidemia, unspecified: Secondary | ICD-10-CM

## 2017-08-17 MED ORDER — ROSUVASTATIN CALCIUM 10 MG PO TABS: 10.0000 mg | ORAL_TABLET | Freq: Every day | ORAL | 3 refills | Status: DC

## 2017-09-07 ENCOUNTER — Ambulatory Visit: Payer: Medicare Other | Admitting: Internal Medicine

## 2017-10-12 ENCOUNTER — Other Ambulatory Visit: Payer: Medicare Other | Admitting: *Deleted

## 2017-10-12 DIAGNOSIS — E785 Hyperlipidemia, unspecified: Secondary | ICD-10-CM

## 2017-10-12 LAB — LIPID PANEL
CHOL/HDL RATIO: 2.5 ratio (ref 0.0–4.4)
Cholesterol, Total: 128 mg/dL (ref 100–199)
HDL: 51 mg/dL (ref >39)
LDL CALC: 59 mg/dL (ref 0–99)
TRIGLYCERIDES: 89 mg/dL (ref 0–149)
VLDL Cholesterol Cal: 18 mg/dL (ref 5–40)

## 2017-10-19 ENCOUNTER — Encounter: Payer: Self-pay | Admitting: Internal Medicine

## 2017-10-19 ENCOUNTER — Ambulatory Visit (INDEPENDENT_AMBULATORY_CARE_PROVIDER_SITE_OTHER): Payer: Medicare Other | Admitting: Internal Medicine

## 2017-10-19 VITALS — BP 150/86 | HR 77 | Ht 64.0 in | Wt 172.0 lb

## 2017-10-19 DIAGNOSIS — I10 Essential (primary) hypertension: Secondary | ICD-10-CM

## 2017-10-19 DIAGNOSIS — R0600 Dyspnea, unspecified: Secondary | ICD-10-CM | POA: Diagnosis not present

## 2017-10-19 DIAGNOSIS — E782 Mixed hyperlipidemia: Secondary | ICD-10-CM | POA: Diagnosis not present

## 2017-10-19 DIAGNOSIS — Z8673 Personal history of transient ischemic attack (TIA), and cerebral infarction without residual deficits: Secondary | ICD-10-CM | POA: Diagnosis not present

## 2017-10-19 NOTE — Patient Instructions (Signed)
Medication Instructions:  Your physician recommends that you continue on your current medications as directed. Please refer to the Current Medication list given to you today.   Labwork: NONE ORDERED TODAY  Testing/Procedures: NONE ORDERED TODAY  Follow-Up: MAY 2019 FOLLOW UP WITH DR. ROSS; WE WILL SEND OUT A REMINDER LETTER   Any Other Special Instructions Will Be Listed Below (If Applicable).     If you need a refill on your cardiac medications before your next appointment, please call your pharmacy.

## 2017-10-19 NOTE — Progress Notes (Signed)
Cardiology Office Note   Date:  10/19/2017   ID:  Rhonda Norris, DOB 01/09/1951, MRN [id number]  PCP:  Catha Gosselin, MD  Cardiologist:  New     Patient referred for DOE     History of Present Illness: Rhonda Norris is a 66 y.o. female with a history of HL, Type 2 DM, TIA  She is folowed by K Little  Seen in clnic on 2/21  Complained of achiness.  Also said that with 3 flights of stairs has to stop and rest   Notes increased stress   Told to stop simvistatin   LDL in June was 119  HDL wsa 62    Summer 2016   Gall bladder surgery  Post procedrue  Gky up but then better   SUmmer 2017  TIA vs seizure (dizziness, R sided numbness)  Admitted  Put on plavix  Simvistatin sttarted    Dose increased in fall  Achiness got worse   Pt noted worse breathing while on simvistatin    I saw the pt in Feb 2018  Since then her breathing has been OK  She denies CP   She had neck surgery  Still with some pain No TIA like spells   Current Meds  Medication Sig  . Ascorbic Acid (VITAMIN C) 500 MG CAPS Take 500 mg by mouth daily.   Marland Kitchen BIOTIN PO Take 1 tablet by mouth daily.  . Calcium Carbonate-Vitamin D (CALCIUM 600+D) 600-200 MG-UNIT TABS Take 1 tablet by mouth daily.   . cetirizine (ZYRTEC) 10 MG tablet Take 10 mg by mouth daily.  . clopidogrel (PLAVIX) 75 MG tablet Take 1 tablet (75 mg total) by mouth daily.  . Cranberry 250 MG CAPS Take 250 mg by mouth daily as needed (for UTI prevention).   . cyanocobalamin 100 MCG tablet Take 100 mcg by mouth daily.  . diphenhydrAMINE (BENADRYL) 25 MG tablet Take 25 mg by mouth every 6 (six) hours as needed for allergies.  . Flaxseed, Linseed, (FLAXSEED OIL) 1000 MG CAPS Take 1,000 mg by mouth daily.   . fluticasone (FLONASE) 50 MCG/ACT nasal spray Place 2 sprays into the nose daily.   . Ginkgo Biloba 120 MG CAPS Take 120 mg by mouth daily.  . Glucosamine HCl 1500 MG TABS Take 1,500 mg by mouth daily.   . Melatonin 3 MG TABS Take 3 mg by mouth at  bedtime as needed (for sleep).   . Misc Natural Products (OSTEO BI-FLEX/5-LOXIN ADVANCED) TABS Take 1 tablet by mouth daily.  . mometasone (NASONEX) 50 MCG/ACT nasal spray Place 50 mcg into the nose daily.  . Multiple Vitamin (MULTIVITAMIN WITH MINERALS) TABS tablet Take 1 tablet by mouth daily.  . nortriptyline (PAMELOR) 25 MG capsule Take 25 mg by mouth at bedtime.   . Omega-3 Fatty Acids (FISH OIL) 1000 MG CAPS Take 1,000 mg by mouth daily.  . polyethylene glycol (MIRALAX / GLYCOLAX) packet Take 17 g by mouth daily as needed for moderate constipation.   . ranitidine (ZANTAC) 150 MG tablet Take 150 mg by mouth daily as needed for heartburn.  . rosuvastatin (CRESTOR) 10 MG tablet Take 1 tablet (10 mg total) by mouth daily.  . Tetrahydrozoline HCl (VISINE OP) Apply 1 drop to eye daily.  . vitamin E 100 UNIT capsule Take 100 Units by mouth daily.      Allergies:   Dexilant [dexlansoprazole]; Sulfa antibiotics; Aspirin; Caffeine; Sulfanilamide; and Methocarbamol   Past Medical History:  Diagnosis Date  .  Allergic rhinitis   . Arthritis   . Carpal tunnel syndrome   . Diabetes mellitus without complication (HCC) 02/08/2017   not on medication  . Fibromyalgia   . GERD (gastroesophageal reflux disease)   . IBD (inflammatory bowel disease)   . Osteopenia 12/2015   T score -1.9 FRAX 3.2%/ 0.2%  . Pancreatitis, gallstone   . Stroke [facility])     Past Surgical History:  Procedure Laterality Date  . ANTERIOR CERVICAL DECOMP/DISCECTOMY FUSION N/A 02/15/2017   Procedure: ACDF C4-5, removal of hardware and exploration of fusion C5-6;  Surgeon: Venita Lick, MD;  Location: [facility] OR;  Service: Orthopedics;  Laterality: N/A;  . APPENDECTOMY    . 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 CHOLECYSTECTOMY N/A 08/17/2015   Procedure: LAPAROSCOPIC CHOLECYSTECTOMY WITH INTRAOPERATIVE CHOLANGIOGRAM;  Surgeon: Abigail Miyamoto, MD;  Location: [facility] OR;  Service: General;  Laterality: N/A;  .  COLPOSCOPY    . HAND SURGERY     bilateral  . KNEE ARTHROSCOPY     rt  . NECK SURGERY    . PELVIC LAPAROSCOPY  1992/1996   endometriosis  . TUBAL LIGATION    . vaginal hysterectomy bilateral salpingo-oophorectomy  11/1996   [facility] BSO endometriosis/leiomyomata     Social History:  The patient  reports that she has never smoked. She has never used smokeless tobacco. She reports that she does not drink alcohol or use drugs.   Family History:  The patient's family history includes Heart disease in her brother and mother; Seizures in her mother.    ROS:  Please see the history of present illness. All other systems are reviewed and  Negative to the above problem except as noted.    PHYSICAL EXAM: VS:  BP (!) 150/86   Pulse 77   Ht 5\' 4"  (1.626 m)   Wt 172 lb (78 kg)   BMI 29.52 kg/m   GEN: Well nourished, well developed, in no acute distress  HEENT: normal  Neck: JVP is normal  No carotid bruits, or masses Cardiac: RRR; no murmurs, rubs, or gallops,no edema  Respiratory:  clear to auscultation bilaterally, normal work of breathing GI: soft, nontender, nondistended, + BS  No hepatomegaly  MS: no deformity Moving all extremities   Skin: warm and dry, no rash Neuro:  Strength and sensation are intact Psych: euthymic mood, full affect   EKG:  EKG is ordered today. SR  77    Lipid Panel    Component Value Date/Time   CHOL 128 10/12/2017 0744   TRIG 89 10/12/2017 0744   TRIG 153 (H) 08/14/2017 0822   HDL 51 10/12/2017 0744   HDL 54 08/14/2017 0822   CHOLHDL 2.5 10/12/2017 0744   CHOLHDL 3.4 06/18/2016 0447   VLDL 24 06/18/2016 0447   LDLCALC 59 10/12/2017 0744      Wt Readings from Last 3 Encounters:  10/19/17 172 lb (78 kg)  02/15/17 178 lb (80.7 kg)  02/13/17 178 lb 12.8 oz (81.1 kg)      ASSESSMENT AND PLAN:  1  SOB  Denies   2  HL  Pt with Hx TIA and mild plaquing  Currently on Crestor  LDL last week 59  HDL 51  Continue  3  HTN  PT says her BP is better  at home  I told her to keep a log at home  Next office visit bring cuff for calibration    3  Hx TIA  On plavix   Asypmtomatic  F/U at End of May 2019  Current medicines are reviewed at length with the patient today.  The patient does not have concerns regarding medicines.  Signed, Dietrich PatesPaula Jasper Ruminski, MD  10/19/2017 9:28 AM    [facility] [address], [place], [place]  [address] Phone: [phone]; Fax: [phone]

## 2017-10-20 ENCOUNTER — Telehealth: Payer: Self-pay | Admitting: Internal Medicine

## 2017-10-20 DIAGNOSIS — E785 Hyperlipidemia, unspecified: Secondary | ICD-10-CM

## 2017-10-20 NOTE — Telephone Encounter (Signed)
Livalo and pravastatin have the most favorable profile on glucose. Livalo is likely more expensive with her insurance. Would start pt on pravastatin 40mg  every evening and have pt monitor glucose/recheck lipids in 3 months.

## 2017-10-20 NOTE — Telephone Encounter (Signed)
Rhonda Norris is calling to see if she can be placed on a different medicine . She is currently on Crestor 10 mg and states that it is making her glucose levels go up . Would like something that will not make those levels go up . Please call  Thanks

## 2017-10-20 NOTE — Telephone Encounter (Signed)
Will route to pharmacy for recommendations.  Pt currently on rosuvastatin.  Has tried Zetia in past, stopped due to side effects per notes.

## 2017-10-27 MED ORDER — PRAVASTATIN SODIUM 40 MG PO TABS: 40.0000 mg | ORAL_TABLET | Freq: Every evening | ORAL | 3 refills | Status: DC

## 2017-10-27 NOTE — Telephone Encounter (Signed)
Spoke with patient.  Her hemoglobin A1C went up over 2 points.   She will start pravastain 40 mg and we will recheck her lipids on 01/30/17.  Appointment made.  She thinks her PCP will be checking A1C again in 3 months.

## 2017-11-23 ENCOUNTER — Other Ambulatory Visit: Payer: Self-pay | Admitting: Gynecology

## 2017-11-23 DIAGNOSIS — Z139 Encounter for screening, unspecified: Secondary | ICD-10-CM

## 2017-12-26 ENCOUNTER — Ambulatory Visit
Admission: RE | Admit: 2017-12-26 | Discharge: 2017-12-26 | Disposition: A | Discharge status: Home / Self Care | Payer: Medicare Other | Source: Ambulatory Visit | Attending: Gynecology | Admitting: Gynecology

## 2017-12-26 DIAGNOSIS — Z139 Encounter for screening, unspecified: Secondary | ICD-10-CM

## 2017-12-27 ENCOUNTER — Other Ambulatory Visit: Payer: Self-pay | Admitting: Gynecology

## 2017-12-27 DIAGNOSIS — R928 Other abnormal and inconclusive findings on diagnostic imaging of breast: Secondary | ICD-10-CM

## 2018-01-01 ENCOUNTER — Other Ambulatory Visit: Payer: Medicare Other

## 2018-01-02 ENCOUNTER — Ambulatory Visit
Admission: RE | Admit: 2018-01-02 | Discharge: 2018-01-02 | Disposition: A | Discharge status: Home / Self Care | Payer: Medicare Other | Source: Ambulatory Visit | Attending: Gynecology | Admitting: Gynecology

## 2018-01-02 DIAGNOSIS — R928 Other abnormal and inconclusive findings on diagnostic imaging of breast: Secondary | ICD-10-CM

## 2018-01-30 ENCOUNTER — Other Ambulatory Visit: Payer: Medicare Other

## 2018-01-30 DIAGNOSIS — E785 Hyperlipidemia, unspecified: Secondary | ICD-10-CM

## 2018-01-30 LAB — LIPID PANEL
CHOL/HDL RATIO: 2.4 ratio (ref 0.0–4.4)
Cholesterol, Total: 149 mg/dL (ref 100–199)
HDL: 62 mg/dL (ref >39)
LDL Calculated: 67 mg/dL (ref 0–99)
Triglycerides: 100 mg/dL (ref 0–149)
VLDL Cholesterol Cal: 20 mg/dL (ref 5–40)

## 2018-02-02 ENCOUNTER — Ambulatory Visit (HOSPITAL_BASED_OUTPATIENT_CLINIC_OR_DEPARTMENT_OTHER)
Admission: RE | Admit: 2018-02-02 | Discharge: 2018-02-02 | Disposition: A | Discharge status: Home / Self Care | Payer: Medicare Other | Source: Ambulatory Visit | Attending: Family Medicine | Admitting: Family Medicine

## 2018-02-02 ENCOUNTER — Other Ambulatory Visit (HOSPITAL_BASED_OUTPATIENT_CLINIC_OR_DEPARTMENT_OTHER): Payer: Self-pay | Admitting: Family Medicine

## 2018-02-02 DIAGNOSIS — M7989 Other specified soft tissue disorders: Secondary | ICD-10-CM

## 2018-02-10 ENCOUNTER — Emergency Department (HOSPITAL_COMMUNITY): Payer: Medicare Other

## 2018-02-10 ENCOUNTER — Other Ambulatory Visit: Payer: Self-pay

## 2018-02-10 ENCOUNTER — Encounter (HOSPITAL_COMMUNITY): Payer: Self-pay | Admitting: Emergency Medicine

## 2018-02-10 ENCOUNTER — Emergency Department (HOSPITAL_COMMUNITY)
Admission: EM | Admit: 2018-02-10 | Discharge: 2018-02-10 | Disposition: A | Discharge status: Home / Self Care | Payer: Medicare Other | Attending: Emergency Medicine | Admitting: Emergency Medicine

## 2018-02-10 DIAGNOSIS — Z79899 Other long term (current) drug therapy: Secondary | ICD-10-CM | POA: Insufficient documentation

## 2018-02-10 DIAGNOSIS — R600 Localized edema: Secondary | ICD-10-CM

## 2018-02-10 DIAGNOSIS — E119 Type 2 diabetes mellitus without complications: Secondary | ICD-10-CM | POA: Insufficient documentation

## 2018-02-10 DIAGNOSIS — Z8673 Personal history of transient ischemic attack (TIA), and cerebral infarction without residual deficits: Secondary | ICD-10-CM | POA: Insufficient documentation

## 2018-02-10 DIAGNOSIS — R2241 Localized swelling, mass and lump, right lower limb: Secondary | ICD-10-CM | POA: Diagnosis not present

## 2018-02-10 DIAGNOSIS — Z7902 Long term (current) use of antithrombotics/antiplatelets: Secondary | ICD-10-CM | POA: Diagnosis not present

## 2018-02-10 DIAGNOSIS — R2242 Localized swelling, mass and lump, left lower limb: Secondary | ICD-10-CM | POA: Diagnosis present

## 2018-02-10 LAB — CBC
HCT: 37.2 % (ref 36.0–46.0)
Hemoglobin: 11.9 g/dL — ABNORMAL LOW (ref 12.0–15.0)
MCH: 28.7 pg (ref 26.0–34.0)
MCHC: 32 g/dL (ref 30.0–36.0)
MCV: 89.9 fL (ref 78.0–100.0)
PLATELETS: 332 10*3/uL (ref 150–400)
RBC: 4.14 MIL/uL (ref 3.87–5.11)
RDW: 13.2 % (ref 11.5–15.5)
WBC: 7.8 10*3/uL (ref 4.0–10.5)

## 2018-02-10 LAB — COMPREHENSIVE METABOLIC PANEL
ALBUMIN: 3.8 g/dL (ref 3.5–5.0)
ALK PHOS: 85 U/L (ref 38–126)
ALT: 25 U/L (ref 14–54)
ANION GAP: 10 (ref 5–15)
AST: 40 U/L (ref 15–41)
BILIRUBIN TOTAL: 0.3 mg/dL (ref 0.3–1.2)
BUN: 17 mg/dL (ref 6–20)
CALCIUM: 8.8 mg/dL — AB (ref 8.9–10.3)
CO2: 24 mmol/L (ref 22–32)
CREATININE: 0.69 mg/dL (ref 0.44–1.00)
Chloride: 104 mmol/L (ref 101–111)
GFR calc Af Amer: >60 mL/min (ref >60)
GFR calc non Af Amer: >60 mL/min (ref >60)
GLUCOSE: 106 mg/dL — AB (ref 65–99)
Potassium: 4 mmol/L (ref 3.5–5.1)
Sodium: 138 mmol/L (ref 135–145)
TOTAL PROTEIN: 6.5 g/dL (ref 6.5–8.1)

## 2018-02-10 LAB — BRAIN NATRIURETIC PEPTIDE: B NATRIURETIC PEPTIDE 5: 6.7 pg/mL (ref 0.0–100.0)

## 2018-02-10 MED ORDER — FUROSEMIDE 20 MG PO TABS: 20.0000 mg | ORAL_TABLET | Freq: Once | ORAL | Status: DC

## 2018-02-10 MED ORDER — FUROSEMIDE 20 MG PO TABS
20.0000 mg | ORAL_TABLET | Freq: Once | ORAL | Status: AC
Start: 2018-02-10 — End: 2018-02-10
  Administered 2018-02-10: 20 mg via ORAL
  Filled 2018-02-10: qty 1

## 2018-02-10 NOTE — Discharge Instructions (Signed)
Please try to limit your salt intake.  Please keep your legs elevated.  Please start the prescription medication that your doctor started you on (lasix).  If you develop any fevers, chills, shortness of breath, chest pain or have any concerns please seek additional medical care.

## 2018-02-10 NOTE — ED Notes (Signed)
Pt stable, ambulatory, states understanding of discharge instructions 

## 2018-02-10 NOTE — ED Provider Notes (Signed)
[facility] Provider Note   CSN: [id number] Arrival date & time: 02/10/18  0024     History   Chief Complaint Chief Complaint  Patient presents with  . Leg Swelling    HPI Rhonda Norris is a [age] y.o. female who presents today for evaluation of bilateral leg swelling.  She reports that she has been seen by her PCP, Dr. Clarene DukeLittle, for this and had blood work.  Chart review shows that we cannot see her blood work.  She did have a bilateral venous duplex on 2/15 which did not show any blood clots.  She denies any chest or abdominal pain, no recent fevers or chills.  She has not been sick recently, no coughs.  She was switched for her fluid pill from an unknown pill to lasix but has not yet had a dose of lasix.  She reports having blood clot blood work done that showed she may have a blood clot.    HPI  Past Medical History:  Diagnosis Date  . Allergic rhinitis   . Arthritis   . Carpal tunnel syndrome   . Diabetes mellitus without complication (HCC) 02/08/2017   not on medication  . Fibromyalgia   . GERD (gastroesophageal reflux disease)   . IBD (inflammatory bowel disease)   . Osteopenia 12/2015   T score -1.9 FRAX 3.2%/ 0.2%  . Pancreatitis, gallstone   . Stroke [facility](HCC)     Patient Active Problem List   Diagnosis Date Noted  . Neck pain 02/15/2017  . Hemiparesthesia: Transient right sided hemiparesthesia  06/20/2016  . Right facial numbness 06/18/2016  . Numbness and tingling of right upper extremity 06/18/2016  . Hyperlipidemia 06/18/2016  . TIA (transient ischemic attack) 06/17/2016  . Pancreatitis 08/15/2015  . Nausea 08/15/2015  . Hyperglycemia 08/15/2015  . IBD (inflammatory bowel disease) 08/15/2015  . Transaminitis 08/15/2015  . GERD without esophagitis 08/15/2015  . Pancreatitis, acute 08/15/2015  . Acute pancreatitis     Past Surgical History:  Procedure Laterality Date  . ANTERIOR CERVICAL DECOMP/DISCECTOMY FUSION N/A  02/15/2017   Procedure: ACDF C4-5, removal of hardware and exploration of fusion C5-6;  Surgeon: Venita Lickahari Brooks, MD;  Location: [facility] OR;  Service: Orthopedics;  Laterality: N/A;  . APPENDECTOMY    . 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 CHOLECYSTECTOMY N/A 08/17/2015   Procedure: LAPAROSCOPIC CHOLECYSTECTOMY WITH INTRAOPERATIVE CHOLANGIOGRAM;  Surgeon: Abigail Miyamotoouglas Blackman, MD;  Location: [facility] OR;  Service: General;  Laterality: N/A;  . COLPOSCOPY    . HAND SURGERY     bilateral  . KNEE ARTHROSCOPY     rt  . NECK SURGERY    . PELVIC LAPAROSCOPY  1992/1996   endometriosis  . TUBAL LIGATION    . vaginal hysterectomy bilateral salpingo-oophorectomy  11/1996   TVH BSO endometriosis/leiomyomata    OB History    Gravida Para Term Preterm AB Living   3 3       3    SAB TAB Ectopic Multiple Live Births                   Home Medications    Prior to Admission medications   Medication Sig Start Date End Date Taking? Authorizing Provider  Ascorbic Acid (VITAMIN C) 500 MG CAPS Take 500 mg by mouth daily.     [provider]  BIOTIN PO Take 1 tablet by mouth daily.    [provider]  Calcium  Carbonate-Vitamin D (CALCIUM 600+D) 600-200 MG-UNIT TABS Take 1 tablet by mouth daily.     [provider]  cetirizine (ZYRTEC) 10 MG tablet Take 10 mg by mouth daily.    [provider]  clopidogrel (PLAVIX) 75 MG tablet Take 1 tablet (75 mg total) by mouth daily. 06/20/16   Rodolph Bong, MD  Cranberry 250 MG CAPS Take 250 mg by mouth daily as needed (for UTI prevention).     [provider]  cyanocobalamin 100 MCG tablet Take 100 mcg by mouth daily.    [provider]  diphenhydrAMINE (BENADRYL) 25 MG tablet Take 25 mg by mouth every 6 (six) hours as needed for allergies.    [provider]  Flaxseed, Linseed, (FLAXSEED OIL) 1000 MG CAPS Take 1,000 mg by mouth daily.     [provider]  fluticasone (FLONASE) 50  MCG/ACT nasal spray Place 2 sprays into the nose daily.     [provider]  Ginkgo Biloba 120 MG CAPS Take 120 mg by mouth daily.    [provider]  Glucosamine HCl 1500 MG TABS Take 1,500 mg by mouth daily.     [provider]  Melatonin 3 MG TABS Take 3 mg by mouth at bedtime as needed (for sleep).     [provider]  Misc Natural Products (OSTEO BI-FLEX/5-LOXIN ADVANCED) TABS Take 1 tablet by mouth daily.    [provider]  mometasone (NASONEX) 50 MCG/ACT nasal spray Place 50 mcg into the nose daily.    [provider]  Multiple Vitamin (MULTIVITAMIN WITH MINERALS) TABS tablet Take 1 tablet by mouth daily.    [provider]  nortriptyline (PAMELOR) 25 MG capsule Take 25 mg by mouth at bedtime.  10/14/14   [provider]  Omega-3 Fatty Acids (FISH OIL) 1000 MG CAPS Take 1,000 mg by mouth daily.    [provider]  polyethylene glycol (MIRALAX / GLYCOLAX) packet Take 17 g by mouth daily as needed for moderate constipation.     [provider]  pravastatin (PRAVACHOL) 40 MG tablet Take 1 tablet (40 mg total) every evening by mouth. 10/27/17   Pricilla Riffle, MD  ranitidine (ZANTAC) 150 MG tablet Take 150 mg by mouth daily as needed for heartburn.    [provider]  Tetrahydrozoline HCl (VISINE OP) Apply 1 drop to eye daily.    [provider]  vitamin E 100 UNIT capsule Take 100 Units by mouth daily.     [provider]    Family History Family History  Problem Relation Age of Onset  . Heart disease Mother   . Seizures Mother   . Heart disease Brother     Social History Social History   Tobacco Use  . Smoking status: Never Smoker  . Smokeless tobacco: Never Used  Substance Use Topics  . Alcohol use: No    Alcohol/week: 0.0 oz  . Drug use: No     Allergies   Dexilant [dexlansoprazole]; Sulfa antibiotics; Aspirin; Caffeine; Sulfanilamide; and  Methocarbamol   Review of Systems Review of Systems  Constitutional: Negative for chills, fatigue, fever and unexpected weight change.  Respiratory: Negative for cough and shortness of breath.   Cardiovascular: Positive for leg swelling. Negative for chest pain and palpitations.  Gastrointestinal: Negative for abdominal pain, diarrhea, nausea and vomiting.  Genitourinary: Negative for decreased urine volume.  Musculoskeletal: Negative for arthralgias and back pain.  All other systems reviewed and are negative.  Physical Exam Updated Vital Signs BP 129/74   Pulse 84   Temp 97.7 F (36.5 C)   Resp 15   Ht 5\' 4"  (1.626 m)   Wt 81.2 kg (179 lb)   SpO2 99%   BMI 30.73 kg/m   Physical Exam  Constitutional: She appears well-developed and well-nourished. No distress.  HENT:  Head: Normocephalic and atraumatic.  Mouth/Throat: Oropharynx is clear and moist.  Eyes: Conjunctivae are normal. Right eye exhibits no discharge. Left eye exhibits no discharge. No scleral icterus.  Neck: Normal range of motion. Neck supple.  Cardiovascular: Normal rate, regular rhythm and intact distal pulses.  There is diffuse minimally pitting edema to bilateral lower legs.  Reports bilateral legs appear symetrically swollen aside from area on upper lateral thigh that is larger on right than left (please see picture.)  Area is not red, warm or TTP.    Pulmonary/Chest: Effort normal and breath sounds normal. No stridor. No respiratory distress.  Abdominal: Soft. Bowel sounds are normal. She exhibits no distension. There is no tenderness.  Musculoskeletal: She exhibits no edema or deformity.  Neurological: She is alert. She exhibits normal muscle tone.  Skin: Skin is warm and dry. She is not diaphoretic.  Psychiatric: She has a normal mood and affect. Her behavior is normal.  Nursing note and vitals reviewed.   ED Treatments / Results  Labs (all labs ordered are listed, but only abnormal results are  displayed) Labs Reviewed  COMPREHENSIVE METABOLIC PANEL - Abnormal; Notable for the following components:      Result Value   Glucose, Bld 106 (*)    Calcium 8.8 (*)    All other components within normal limits  CBC - Abnormal; Notable for the following components:   Hemoglobin 11.9 (*)    All other components within normal limits  BRAIN NATRIURETIC PEPTIDE    EKG  EKG Interpretation  Date/Time:  Saturday February 10 2018 07:52:13 EST Ventricular Rate:  83 PR Interval:    QRS Duration: 101 QT Interval:  393 QTC Calculation: 462 R Axis:   52 Text Interpretation:  Sinus rhythm Nonspecific T wave abnormality No significant change since last tracing Confirmed by Cathren Laine ([id number]) on 02/10/2018 9:11:29 AM       Radiology Dg Chest 2 View  Result Date: 02/10/2018 CLINICAL DATA:  Bilateral thigh swelling. EXAM: CHEST  2 VIEW COMPARISON:  Chest x-ray dated June 17, 2016. FINDINGS: The heart size is borderline enlarged. Normal mediastinal contours. Normal pulmonary vascularity. Atherosclerotic calcification of the aortic arch. No focal consolidation, pleural effusion, or pneumothorax. No acute osseous abnormality. IMPRESSION: No active cardiopulmonary disease. Electronically Signed   By: Obie Dredge M.D.   On: 02/10/2018 08:17    Procedures Procedures (including critical care time)  Medications Ordered in ED Medications  furosemide (LASIX) tablet 20 mg (20 mg Oral Given 02/10/18 1007)     Initial Impression / Assessment and Plan / ED Course  I have reviewed the triage vital signs and the nursing notes.  Pertinent labs & imaging results that were available during my care of the patient were reviewed by me and considered in my medical decision making (see chart for details).  Clinical Course as of Feb 10 1610  Sat Feb 10, 2018  1308 Asked patient what her reaction to sulfa drugs is.  She reports that she has gotten a headache with sulfa antibiotics in the past.  She denies  any reactions with rashes, facial swelling, shortness of breath, or hypotension.  Confirm that she was prescribed Lasix by her PCP.  We will give her a dose here while awaiting BNP.  [EH]  1024 Called lab to follow up on BNP, is on machine now.   [EH]    Clinical Course User Index [EH] Norman Clay   Tunya B Mccullars presents today for evaluation of symmetrical bilateral lower extremity swelling times 3-4 weeks.  She previously had a DVT study that was without DVT or other cause for her symptoms.  Labs were obtained, grossly unremarkable.  BNP 6.7.  Given that she has had a DVT study during this I do not feel like it is necessary to repeat at this time, especially as her swelling is symmetric.  She was advised to follow up with her PCP, elevate her legs and restrict her salt intake.     At this time there does not appear to be any evidence of an acute emergency medical condition and the patient appears stable for discharge with appropriate outpatient follow up.Diagnosis was discussed with patient who verbalizes understanding and is agreeable to discharge. Pt case discussed with Dr. Denton Lank who agrees with my plan.    Final Clinical Impressions(s) / ED Diagnoses   Final diagnoses:  Bilateral lower extremity edema    ED Discharge Orders    None       Norman Clay 02/10/18 1613    Cathren Laine, MD 02/11/18 [phone]

## 2018-02-10 NOTE — ED Triage Notes (Signed)
Pt c/o bilateral thigh swelling. Pt has seen PCP for problem, pt reports d-dimer negative and doppler negative for DVT. Started on "fluid pill" with no change. Denies chest pain/shortness of breath. Ambulatory without difficulty.

## 2018-02-13 ENCOUNTER — Other Ambulatory Visit: Payer: Self-pay | Admitting: Family Medicine

## 2018-02-13 DIAGNOSIS — R1900 Intra-abdominal and pelvic swelling, mass and lump, unspecified site: Secondary | ICD-10-CM

## 2018-02-26 ENCOUNTER — Ambulatory Visit
Admission: RE | Admit: 2018-02-26 | Discharge: 2018-02-26 | Disposition: A | Discharge status: Home / Self Care | Payer: Medicare Other | Source: Ambulatory Visit | Attending: Family Medicine | Admitting: Family Medicine

## 2018-02-26 DIAGNOSIS — R1900 Intra-abdominal and pelvic swelling, mass and lump, unspecified site: Secondary | ICD-10-CM

## 2018-02-26 MED ORDER — IOPAMIDOL (ISOVUE-300) INJECTION 61%
100.0000 mL | Freq: Once | INTRAVENOUS | Status: AC | PRN
  Administered 2018-02-26: 100 mL via INTRAVENOUS

## 2018-03-27 ENCOUNTER — Encounter: Payer: Self-pay | Admitting: Gynecology

## 2018-03-27 ENCOUNTER — Ambulatory Visit (INDEPENDENT_AMBULATORY_CARE_PROVIDER_SITE_OTHER): Payer: Medicare Other | Admitting: Gynecology

## 2018-03-27 VITALS — BP 122/82 | Ht 64.0 in | Wt 182.0 lb

## 2018-03-27 DIAGNOSIS — M8580 Other specified disorders of bone density and structure, unspecified site: Secondary | ICD-10-CM

## 2018-03-27 DIAGNOSIS — N952 Postmenopausal atrophic vaginitis: Secondary | ICD-10-CM | POA: Diagnosis not present

## 2018-03-27 DIAGNOSIS — Z1272 Encounter for screening for malignant neoplasm of vagina: Secondary | ICD-10-CM

## 2018-03-27 DIAGNOSIS — Z01411 Encounter for gynecological examination (general) (routine) with abnormal findings: Secondary | ICD-10-CM | POA: Diagnosis not present

## 2018-03-27 NOTE — Patient Instructions (Signed)
Follow-up for the bone density as scheduled. 

## 2018-03-27 NOTE — Addendum Note (Signed)
Addended by: Dayna BarkerGARDNER, Eudelia Hiltunen K on: 03/27/2018 10:22 AM   Modules accepted: Orders

## 2018-03-27 NOTE — Progress Notes (Signed)
    Rhonda Norris [birth year] [id number]        67 y.o.  G3P3 for annual gynecologic exam.  Without gynecologic complaints  Past medical history,surgical history, problem list, medications, allergies, family history and social history were all reviewed and documented as reviewed in the EPIC chart.  ROS:  Performed with pertinent positives and negatives included in the history, assessment and plan.   Additional significant findings : None   Exam: Kennon PortelaKim Gardner assistant Vitals:   03/27/18 0931  BP: 122/82  Weight: 182 lb (82.6 kg)  Height: 5\' 4"  (1.626 m)   Body mass index is 31.24 kg/m.  General appearance:  Normal affect, orientation and appearance. Skin: Grossly normal HEENT: Without gross lesions.  No cervical or supraclavicular adenopathy. Thyroid normal.  Lungs:  Clear without wheezing, rales or rhonchi Cardiac: RR, without RMG Abdominal:  Soft, nontender, without masses, guarding, rebound, organomegaly or hernia Breasts:  Examined lying and sitting without masses, retractions, discharge or axillary adenopathy. Pelvic:  Ext, BUS, Vagina: With atrophic changes.  Pap smear of the cuff done  Adnexa: Without masses or tenderness    Anus and perineum: Normal   Rectovaginal: Normal sphincter tone without palpated masses or tenderness.    Assessment/Plan:  67 y.o. G3P3 female for annual gynecologic exam, status post TVH BSO 1997 for leiomyoma and endometriosis..   1. Postmenopausal/atrophic genital changes.  No significant hot flushes, night sweats or any vaginal dryness. 2. Pap smear 2015.  Pap smear of vaginal cuff done today.  Options to stop screening per current screening guidelines reviewed.  Will readdress on annual basis.  No history of significant abnormal Pap smears previously. 3. Mammography 12/2017.  Continue with annual mammography when due.  Breast exam normal today. 4. Osteopenia.  DEXA 2017 T score -1.9 FRAX 3% / 0.2%.  Repeat DEXA now at 2-year interval and  patient agrees to schedule in follow-up for this. 5. Colonoscopy 2014.  Repeat at their recommended interval. 6. Health maintenance.  No routine lab work done as patient does this elsewhere.  Follow-up for bone density.  Follow-up in 1 year for annual exam.   Dara Lordsimothy P Teresita Fanton MD, 10:06 AM 03/27/2018    3

## 2018-03-29 LAB — PAP IG W/ RFLX HPV ASCU

## 2018-04-16 ENCOUNTER — Ambulatory Visit (INDEPENDENT_AMBULATORY_CARE_PROVIDER_SITE_OTHER): Payer: Medicare Other

## 2018-04-16 ENCOUNTER — Encounter: Payer: Self-pay | Admitting: Gynecology

## 2018-04-16 DIAGNOSIS — Z780 Asymptomatic menopausal state: Secondary | ICD-10-CM | POA: Diagnosis not present

## 2018-04-16 DIAGNOSIS — M8588 Other specified disorders of bone density and structure, other site: Secondary | ICD-10-CM

## 2018-04-16 DIAGNOSIS — M8580 Other specified disorders of bone density and structure, unspecified site: Secondary | ICD-10-CM

## 2018-04-17 ENCOUNTER — Other Ambulatory Visit: Payer: Self-pay | Admitting: Gynecology

## 2018-04-17 DIAGNOSIS — Z780 Asymptomatic menopausal state: Secondary | ICD-10-CM

## 2018-04-17 DIAGNOSIS — M8588 Other specified disorders of bone density and structure, other site: Secondary | ICD-10-CM

## 2018-04-19 NOTE — Progress Notes (Signed)
Cardiology Office Note   Date:  04/20/2018   ID:  Rhonda Norris, DOB March 06, 1951, MRN [id number]  PCP:  Catha Gosselin, MD  Cardiologist:  New    F/U of dyspnea  History of Present Illness: Rhonda Norris is a 67 y.o. female with a history of HL, Type 2 DM, TIA fs seizure(2017), SOB, achiness  She is folowed by K Little  Hx of achiness and SOB on simvistatin    I saw the pt in Nov 2018    Not short of breath at time  BP was better at home     Seen in ED in Feb 2019   Edema   Legs    Seen by Dr Clarene Duke as well after   Rx for    Lasix given  Current Meds  Medication Sig  . Ascorbic Acid (VITAMIN C) 500 MG CAPS Take 500 mg by mouth daily.   Marland Kitchen BIOTIN PO Take 1 tablet by mouth daily.  . Calcium Carbonate-Vitamin D (CALCIUM 600+D) 600-200 MG-UNIT TABS Take 1 tablet by mouth daily.   . cetirizine (ZYRTEC) 10 MG tablet Take 10 mg by mouth daily.  . clopidogrel (PLAVIX) 75 MG tablet Take 1 tablet (75 mg total) by mouth daily.  . Cranberry 250 MG CAPS Take 250 mg by mouth daily as needed (for UTI prevention).   . cyanocobalamin 100 MCG tablet Take 100 mcg by mouth daily.  . diphenhydrAMINE (BENADRYL) 25 MG tablet Take 25 mg by mouth every 6 (six) hours as needed for allergies.  . Flaxseed, Linseed, (FLAXSEED OIL) 1000 MG CAPS Take 1,000 mg by mouth daily.   . fluticasone (FLONASE) 50 MCG/ACT nasal spray Place 2 sprays into the nose daily.   . Ginkgo Biloba 120 MG CAPS Take 120 mg by mouth daily.  . Glucosamine HCl 1500 MG TABS Take 1,500 mg by mouth daily.   . Melatonin 3 MG TABS Take 3 mg by mouth at bedtime as needed (for sleep).   . metFORMIN (GLUCOPHAGE) 500 MG tablet Take by mouth 2 (two) times daily with a meal.  . Misc Natural Products (OSTEO BI-FLEX/5-LOXIN ADVANCED) TABS Take 1 tablet by mouth daily.  . mometasone (NASONEX) 50 MCG/ACT nasal spray Place 50 mcg into the nose daily.  . Multiple Vitamin (MULTIVITAMIN WITH MINERALS) TABS tablet Take 1 tablet by mouth daily.  .  nortriptyline (PAMELOR) 25 MG capsule Take 25 mg by mouth at bedtime.   . Omega-3 Fatty Acids (FISH OIL) 1000 MG CAPS Take 1,000 mg by mouth daily.  . polyethylene glycol (MIRALAX / GLYCOLAX) packet Take 17 g by mouth daily as needed for moderate constipation.   . pravastatin (PRAVACHOL) 40 MG tablet Take 1 tablet (40 mg total) every evening by mouth.  . ranitidine (ZANTAC) 150 MG tablet Take 150 mg by mouth daily as needed for heartburn.  . Tetrahydrozoline HCl (VISINE OP) Apply 1 drop to eye daily.  . vitamin E 100 UNIT capsule Take 100 Units by mouth daily.      Allergies:   Dexilant [dexlansoprazole]; Sulfa antibiotics; Aspirin; Caffeine; Sulfanilamide; and Methocarbamol   Past Medical History:  Diagnosis Date  . Allergic rhinitis   . Arthritis   . Carpal tunnel syndrome   . Diabetes mellitus without complication (HCC) 02/08/2017   not on medication  . Elevated cholesterol   . Fibromyalgia   . GERD (gastroesophageal reflux disease)   . IBD (inflammatory bowel disease)   . Osteopenia 03/2018   T score -2.1  FRAX 3.7% / 0.5%  . Pancreatitis, gallstone   . Stroke [facility])     Past Surgical History:  Procedure Laterality Date  . ANTERIOR CERVICAL DECOMP/DISCECTOMY FUSION N/A 02/15/2017   Procedure: ACDF C4-5, removal of hardware and exploration of fusion C5-6;  Surgeon: Venita Lick, MD;  Location: [facility] OR;  Service: Orthopedics;  Laterality: N/A;  . APPENDECTOMY    . 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 CHOLECYSTECTOMY N/A 08/17/2015   Procedure: LAPAROSCOPIC CHOLECYSTECTOMY WITH INTRAOPERATIVE CHOLANGIOGRAM;  Surgeon: Abigail Miyamoto, MD;  Location: [facility] OR;  Service: General;  Laterality: N/A;  . COLPOSCOPY    . HAND SURGERY     bilateral  . KNEE ARTHROSCOPY     rt  . NECK SURGERY    . PELVIC LAPAROSCOPY  1992/1996   endometriosis  . TUBAL LIGATION    . vaginal hysterectomy bilateral salpingo-oophorectomy  11/1996   TVH BSO endometriosis/leiomyomata       Social History:  The patient  reports that she has never smoked. She has never used smokeless tobacco. She reports that she does not drink alcohol or use drugs.   Family History:  The patient's family history includes Heart disease in her brother and mother; Seizures in her mother.    ROS:  Please see the history of present illness. All other systems are reviewed and  Negative to the above problem except as noted.    PHYSICAL EXAM: VS:  BP (!) 162/94   Pulse 93   Ht  (1.626 m)   Wt 180 lb (81.6 kg)   BMI 30.90 kg/m    BP on recheck 134/86    GEN: Obese 67 yo in no acute distress  HEENT: normal  Neck: No JVD   No carotid bruits, or masses Cardiac: RRR; no murmurs, rubs, or gallops,no edema  Respiratory:  clear to auscultation bilaterally, normal work of breathing GI: soft, nontender, nondistended, + BS  No hepatomegaly  MS: no deformity Moving all extremities   Skin: warm and dry, no rash Neuro:  Strength and sensation are intact Psych: euthymic mood, full affect   EKG:  EKG is not ordered today.    Lipid Panel    Component Value Date/Time   CHOL 149 01/30/2018 0830   TRIG 100 01/30/2018 0830   TRIG 153 (H) 08/14/2017 0822   HDL 62 01/30/2018 0830   HDL 54 08/14/2017 0822   CHOLHDL 2.4 01/30/2018 0830   CHOLHDL 3.4 06/18/2016 0447   VLDL 24 06/18/2016 0447   LDLCALC 67 01/30/2018 0830      Wt Readings from Last 3 Encounters:  04/20/18 180 lb (81.6 kg)  03/27/18 182 lb (82.6 kg)  02/10/18 179 lb (81.2 kg)      ASSESSMENT AND PLAN:  1  HTN   BP is better on my check   I have asked pt to keep log  Bring cuff to clic with her in 4 to 6 wks    2  HL  Pt with Hx TIA and mild plaquing  Keep  on Crestor   3  Hx TIA  On plavix   Asypmtomatic  F/U in 1 month  Current medicines are reviewed at length with the patient today.  The patient does not have concerns regarding medicines.  Signed, Dietrich Pates, MD  04/20/2018 9:59 AM    [facility] [address] [place], [place], [place]  [address] Phone: [phone]; Fax: [phone]    

## 2018-04-20 ENCOUNTER — Encounter: Payer: Self-pay | Admitting: Internal Medicine

## 2018-04-20 ENCOUNTER — Ambulatory Visit (INDEPENDENT_AMBULATORY_CARE_PROVIDER_SITE_OTHER): Payer: Medicare Other | Admitting: Internal Medicine

## 2018-04-20 VITALS — BP 162/94 | HR 93 | Ht 64.0 in | Wt 180.0 lb

## 2018-04-20 DIAGNOSIS — E785 Hyperlipidemia, unspecified: Secondary | ICD-10-CM

## 2018-04-20 DIAGNOSIS — Z8673 Personal history of transient ischemic attack (TIA), and cerebral infarction without residual deficits: Secondary | ICD-10-CM

## 2018-04-20 DIAGNOSIS — I10 Essential (primary) hypertension: Secondary | ICD-10-CM

## 2018-04-20 NOTE — Patient Instructions (Addendum)
Your physician recommends that you continue on your current medications as directed. Please refer to the Current Medication list given to you today. Your physician recommends that you schedule a follow-up appointment in: 4-6 weeks with Dr. Tenny Craw. BRING YOUR BLOOD PRESSURE CUFF AND LIST OF READINGS WITH YOU.

## 2018-05-15 ENCOUNTER — Ambulatory Visit: Payer: Medicare Other | Admitting: Internal Medicine

## 2018-05-16 ENCOUNTER — Encounter: Payer: Self-pay | Admitting: Cardiology

## 2018-05-16 ENCOUNTER — Ambulatory Visit (INDEPENDENT_AMBULATORY_CARE_PROVIDER_SITE_OTHER): Payer: Medicare Other | Admitting: Cardiology

## 2018-05-16 VITALS — BP 138/78 | HR 85 | Ht 64.0 in | Wt 180.2 lb

## 2018-05-16 DIAGNOSIS — I10 Essential (primary) hypertension: Secondary | ICD-10-CM

## 2018-05-16 DIAGNOSIS — E785 Hyperlipidemia, unspecified: Secondary | ICD-10-CM

## 2018-05-16 MED ORDER — AMLODIPINE BESYLATE 5 MG PO TABS: 5.0000 mg | ORAL_TABLET | Freq: Every day | ORAL | 11 refills | Status: DC

## 2018-05-16 NOTE — Patient Instructions (Signed)
Medication Instructions:  Your physician has recommended you make the following change in your medication:  START: Amlodipine 5 mg (1 tablet) one time a day   If you need a refill on your cardiac medications, please contact your pharmacy first.  Labwork: None ordered   Testing/Procedures: None ordered   Follow-Up: Your physician recommends that you schedule a follow-up appointment in: 3 months with Rhonda Leyden, NP    Any Other Special Instructions Will Be Listed Below (If Applicable).  DASH Eating Plan DASH stands for "Dietary Approaches to Stop Hypertension." The DASH eating plan is a healthy eating plan that has been shown to reduce high blood pressure (hypertension). It may also reduce your risk for type 2 diabetes, heart disease, and stroke. The DASH eating plan may also help with weight loss. What are tips for following this plan? General guidelines  Avoid eating more than 2,300 mg (milligrams) of salt (sodium) a day. If you have hypertension, you may need to reduce your sodium intake to 1,500 mg a day.  Limit alcohol intake to no more than 1 drink a day for nonpregnant women and 2 drinks a day for men. One drink equals 12 oz of beer, 5 oz of wine, or 1 oz of hard liquor.  Work with your health care provider to maintain a healthy body weight or to lose weight. Ask what an ideal weight is for you.  Get at least 30 minutes of exercise that causes your heart to beat faster (aerobic exercise) most days of the week. Activities may include walking, swimming, or biking.  Work with your health care provider or diet and nutrition specialist (dietitian) to adjust your eating plan to your individual calorie needs. Reading food labels  Check food labels for the amount of sodium per serving. Choose foods with less than 5 percent of the Daily Value of sodium. Generally, foods with less than 300 mg of sodium per serving fit into this eating plan.  To find whole grains, look for the word  "whole" as the first word in the ingredient list. Shopping  Buy products labeled as "low-sodium" or "no salt added."  Buy fresh foods. Avoid canned foods and premade or frozen meals. Cooking  Avoid adding salt when cooking. Use salt-free seasonings or herbs instead of table salt or sea salt. Check with your health care provider or pharmacist before using salt substitutes.  Do not fry foods. Cook foods using healthy methods such as baking, boiling, grilling, and broiling instead.  Cook with heart-healthy oils, such as olive, canola, soybean, or sunflower oil. Meal planning   Eat a balanced diet that includes: ? 5 or more servings of fruits and vegetables each day. At each meal, try to fill half of your plate with fruits and vegetables. ? Up to 6-8 servings of whole grains each day. ? Less than 6 oz of lean meat, poultry, or fish each day. A 3-oz serving of meat is about the same size as a deck of cards. One egg equals 1 oz. ? 2 servings of low-fat dairy each day. ? A serving of nuts, seeds, or beans 5 times each week. ? Heart-healthy fats. Healthy fats called Omega-3 fatty acids are found in foods such as flaxseeds and coldwater fish, like sardines, salmon, and mackerel.  Limit how much you eat of the following: ? Canned or prepackaged foods. ? Food that is high in trans fat, such as fried foods. ? Food that is high in saturated fat, such as fatty meat. ?  Sweets, desserts, sugary drinks, and other foods with added sugar. ? Full-fat dairy products.  Do not salt foods before eating.  Try to eat at least 2 vegetarian meals each week.  Eat more home-cooked food and less restaurant, buffet, and fast food.  When eating at a restaurant, ask that your food be prepared with less salt or no salt, if possible. What foods are recommended? The items listed may not be a complete list. Talk with your dietitian about what dietary choices are best for you. Grains Whole-grain or whole-wheat  bread. Whole-grain or whole-wheat pasta. Brown rice. [place]. Bulgur. Whole-grain and low-sodium cereals. Pita bread. Low-fat, low-sodium crackers. Whole-wheat flour tortillas. Vegetables Fresh or frozen vegetables (raw, steamed, roasted, or grilled). Low-sodium or reduced-sodium tomato and vegetable juice. Low-sodium or reduced-sodium tomato sauce and tomato paste. Low-sodium or reduced-sodium canned vegetables. Fruits All fresh, dried, or frozen fruit. Canned fruit in natural juice (without added sugar). Meat and other protein foods Skinless chicken or [place]. Ground chicken or [place]. Pork with fat trimmed off. Fish and seafood. Egg whites. Dried beans, peas, or lentils. Unsalted nuts, nut butters, and seeds. Unsalted canned beans. Lean cuts of beef with fat trimmed off. Low-sodium, lean deli meat. Dairy Low-fat (1%) or fat-free (skim) milk. Fat-free, low-fat, or reduced-fat cheeses. Nonfat, low-sodium ricotta or cottage cheese. Low-fat or nonfat yogurt. Low-fat, low-sodium cheese. Fats and oils Soft margarine without trans fats. Vegetable oil. Low-fat, reduced-fat, or light mayonnaise and salad dressings (reduced-sodium). Canola, safflower, olive, soybean, and sunflower oils. Avocado. Seasoning and other foods Herbs. Spices. Seasoning mixes without salt. Unsalted popcorn and pretzels. Fat-free sweets. What foods are not recommended? The items listed may not be a complete list. Talk with your dietitian about what dietary choices are best for you. Grains Baked goods made with fat, such as croissants, muffins, or some breads. Dry pasta or rice meal packs. Vegetables Creamed or fried vegetables. Vegetables in a cheese sauce. Regular canned vegetables (not low-sodium or reduced-sodium). Regular canned tomato sauce and paste (not low-sodium or reduced-sodium). Regular tomato and vegetable juice (not low-sodium or reduced-sodium). Angie Fava. Olives. Fruits Canned fruit in a light or heavy  syrup. Fried fruit. Fruit in cream or butter sauce. Meat and other protein foods Fatty cuts of meat. Ribs. Fried meat. Berniece Salines. Sausage. Bologna and other processed lunch meats. Salami. Fatback. Hotdogs. Bratwurst. Salted nuts and seeds. Canned beans with added salt. Canned or smoked fish. Whole eggs or egg yolks. Chicken or [place] with skin. Dairy Whole or 2% milk, cream, and half-and-half. Whole or full-fat cream cheese. Whole-fat or sweetened yogurt. Full-fat cheese. Nondairy creamers. Whipped toppings. Processed cheese and cheese spreads. Fats and oils Butter. Stick margarine. Lard. Shortening. Ghee. Bacon fat. Tropical oils, such as coconut, palm kernel, or palm oil. Seasoning and other foods Salted popcorn and pretzels. Onion salt, garlic salt, seasoned salt, table salt, and sea salt. Worcestershire sauce. Tartar sauce. Barbecue sauce. Teriyaki sauce. Soy sauce, including reduced-sodium. Steak sauce. Canned and packaged gravies. Fish sauce. Oyster sauce. Cocktail sauce. Horseradish that you find on the shelf. Ketchup. Mustard. Meat flavorings and tenderizers. Bouillon cubes. Hot sauce and Tabasco sauce. Premade or packaged marinades. Premade or packaged taco seasonings. Relishes. Regular salad dressings. Where to find more information:  National Heart, Lung, and [place]: https://wilson-eaton.com/  American Heart Association: www.heart.org Summary  The DASH eating plan is a healthy eating plan that has been shown to reduce high blood pressure (hypertension). It may also reduce your risk for type 2 diabetes,  heart disease, and stroke.  With the DASH eating plan, you should limit salt (sodium) intake to 2,300 mg a day. If you have hypertension, you may need to reduce your sodium intake to 1,500 mg a day.  When on the DASH eating plan, aim to eat more fresh fruits and vegetables, whole grains, lean proteins, low-fat dairy, and heart-healthy fats.  Work with your health care provider or diet and  nutrition specialist (dietitian) to adjust your eating plan to your individual calorie needs. This information is not intended to replace advice given to you by your health care provider. Make sure you discuss any questions you have with your health care provider. Document Released: 11/24/2011 Document Revised: 11/28/2016 Document Reviewed: 11/28/2016 Elsevier Interactive Patient Education  2018 [employer].  Amlodipine tablets What is this medicine? AMLODIPINE (am LOE di peen) is a calcium-channel blocker. It affects the amount of calcium found in your heart and muscle cells. This relaxes your blood vessels, which can reduce the amount of work the heart has to do. This medicine is used to lower high blood pressure. It is also used to prevent chest pain. This medicine may be used for other purposes; ask your health care provider or pharmacist if you have questions. COMMON BRAND NAME(S): Norvasc What should I tell my health care provider before I take this medicine? They need to know if you have any of these conditions: -heart problems like heart failure or aortic stenosis -liver disease -an unusual or allergic reaction to amlodipine, other medicines, foods, dyes, or preservatives -pregnant or trying to get pregnant -breast-feeding How should I use this medicine? Take this medicine by mouth with a glass of water. Follow the directions on the prescription label. Take your medicine at regular intervals. Do not take more medicine than directed. Talk to your pediatrician regarding the use of this medicine in children. Special care may be needed. This medicine has been used in children as young as 6. Persons over 69 years old may have a stronger reaction to this medicine and need smaller doses. Overdosage: If you think you have taken too much of this medicine contact a poison control center or emergency room at once. NOTE: This medicine is only for you. Do not share this medicine with others. What  if I miss a dose? If you miss a dose, take it as soon as you can. If it is almost time for your next dose, take only that dose. Do not take double or extra doses. What may interact with this medicine? -herbal or dietary supplements -local or general anesthetics -medicines for high blood pressure -medicines for prostate problems -rifampin This list may not describe all possible interactions. Give your health care provider a list of all the medicines, herbs, non-prescription drugs, or dietary supplements you use. Also tell them if you smoke, drink alcohol, or use illegal drugs. Some items may interact with your medicine. What should I watch for while using this medicine? Visit your doctor or health care professional for regular check ups. Check your blood pressure and pulse rate regularly. Ask your health care professional what your blood pressure and pulse rate should be, and when you should contact him or her. This medicine may make you feel confused, dizzy or lightheaded. Do not drive, use machinery, or do anything that needs mental alertness until you know how this medicine affects you. To reduce the risk of dizzy or fainting spells, do not sit or stand up quickly, especially if you are an older  patient. Avoid alcoholic drinks; they can make you more dizzy. Do not suddenly stop taking amlodipine. Ask your doctor or health care professional how you can gradually reduce the dose. What side effects may I notice from receiving this medicine? Side effects that you should report to your doctor or health care professional as soon as possible: -allergic reactions like skin rash, itching or hives, swelling of the face, lips, or tongue -breathing problems -changes in vision or hearing -chest pain -fast, irregular heartbeat -swelling of legs or ankles Side effects that usually do not require medical attention (report to your doctor or health care professional if they continue or are bothersome): -dry  mouth -facial flushing -nausea, vomiting -stomach gas, pain -tired, weak -trouble sleeping This list may not describe all possible side effects. Call your doctor for medical advice about side effects. You may report side effects to FDA at 1-800-FDA-1088. Where should I keep my medicine? Keep out of the reach of children. Store at room temperature between 59 and 86 degrees F (15 and 30 degrees C). Protect from light. Keep container tightly closed. Throw away any unused medicine after the expiration date. NOTE: This sheet is a summary. It may not cover all possible information. If you have questions about this medicine, talk to your doctor, pharmacist, or health care provider.  2018 Elsevier/Gold Standard (2012-11-02 11:40:58)    Thank you for choosing [facility]    Lyda Perone, RN  [phone]  If you need a refill on your cardiac medications before your next appointment, please call your pharmacy.

## 2018-05-16 NOTE — Progress Notes (Signed)
Cardiology Office Note:    Date:  05/16/2018   ID:  Rhonda Norris, DOB 1951/02/24, MRN [id number]  PCP:  Catha Gosselin, MD  Cardiologist:  Dietrich Pates, MD  Referring MD: Catha Gosselin, MD   Chief Complaint  Patient presents with  . Hypertension    History of Present Illness:    Rhonda Norris is a 67 y.o. female with a past medical history significant for hyperlipidemia, type 2 diabetes, TIA vs seizure (2017).   Recent leg swelling, given Lasix. She discovered that it was her knee that caused her swelling.   Pt brought in home BP log with most BP's in 120's-140's/ 70's-80's. The highest SBPs were in the upper 140's-150.  Her cuff was compared to manual BP and it runs about 6 points higher.   She drinks one cup of coffee or 1 soda per day. She tries to limit caffeine and limits her sodium intake. She is limited on exercise due to knee issues. She does have a stationary bike at home but has not been using it recently.    Past Medical History:  Diagnosis Date  . Allergic rhinitis   . Arthritis   . Carpal tunnel syndrome   . Diabetes mellitus without complication (HCC) 02/08/2017   not on medication  . Elevated cholesterol   . Fibromyalgia   . GERD (gastroesophageal reflux disease)   . IBD (inflammatory bowel disease)   . Osteopenia 03/2018   T score -2.1 FRAX 3.7% / 0.5%  . Pancreatitis, gallstone   . Stroke [facility])     Past Surgical History:  Procedure Laterality Date  . ANTERIOR CERVICAL DECOMP/DISCECTOMY FUSION N/A 02/15/2017   Procedure: ACDF C4-5, removal of hardware and exploration of fusion C5-6;  Surgeon: Venita Lick, MD;  Location: [facility] OR;  Service: Orthopedics;  Laterality: N/A;  . APPENDECTOMY    . 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 CHOLECYSTECTOMY N/A 08/17/2015   Procedure: LAPAROSCOPIC CHOLECYSTECTOMY WITH INTRAOPERATIVE CHOLANGIOGRAM;  Surgeon: Abigail Miyamoto, MD;  Location: [facility] OR;  Service: General;  Laterality: N/A;  .  COLPOSCOPY    . HAND SURGERY     bilateral  . KNEE ARTHROSCOPY     rt  . NECK SURGERY    . PELVIC LAPAROSCOPY  1992/1996   endometriosis  . TUBAL LIGATION    . vaginal hysterectomy bilateral salpingo-oophorectomy  11/1996   [facility] BSO endometriosis/leiomyomata    Current Medications: Current Meds  Medication Sig  . Ascorbic Acid (VITAMIN C) 500 MG CAPS Take 500 mg by mouth daily.   Marland Kitchen BIOTIN PO Take 1 tablet by mouth daily.  . Calcium Carbonate-Vitamin D (CALCIUM 600+D) 600-200 MG-UNIT TABS Take 1 tablet by mouth daily.   . cetirizine (ZYRTEC) 10 MG tablet Take 10 mg by mouth daily.  . clopidogrel (PLAVIX) 75 MG tablet Take 1 tablet (75 mg total) by mouth daily.  . Cranberry 250 MG CAPS Take 250 mg by mouth daily as needed (for UTI prevention).   . diphenhydrAMINE (BENADRYL) 25 MG tablet Take 25 mg by mouth every 6 (six) hours as needed for allergies.  . Flaxseed, Linseed, (FLAXSEED OIL) 1000 MG CAPS Take 1,000 mg by mouth daily.   . fluticasone (FLONASE) 50 MCG/ACT nasal spray Place 2 sprays into the nose daily.   . Ginkgo Biloba 120 MG CAPS Take 120 mg by mouth daily.  . Glucosamine HCl 1500 MG TABS Take 1,500 mg by mouth daily.   Marland Kitchen  Melatonin 3 MG TABS Take 3 mg by mouth at bedtime as needed (for sleep).   . metFORMIN (GLUCOPHAGE) 500 MG tablet Take by mouth 2 (two) times daily with a meal.  . Misc Natural Products (OSTEO BI-FLEX/5-LOXIN ADVANCED) TABS Take 1 tablet by mouth daily.  . mometasone (NASONEX) 50 MCG/ACT nasal spray Place 50 mcg into the nose daily.  . Multiple Vitamin (MULTIVITAMIN WITH MINERALS) TABS tablet Take 1 tablet by mouth daily.  . nortriptyline (PAMELOR) 25 MG capsule Take 25 mg by mouth at bedtime.   . Omega-3 Fatty Acids (FISH OIL) 1000 MG CAPS Take 1,000 mg by mouth daily.  . polyethylene glycol (MIRALAX / GLYCOLAX) packet Take 17 g by mouth daily as needed for moderate constipation.   . pravastatin (PRAVACHOL) 40 MG tablet Take 1 tablet (40 mg total) every  evening by mouth.  . ranitidine (ZANTAC) 150 MG tablet Take 150 mg by mouth daily as needed for heartburn.  . Tetrahydrozoline HCl (VISINE OP) Apply 1 drop to eye daily.  . vitamin E 100 UNIT capsule Take 100 Units by mouth daily.      Allergies:   Dexilant [dexlansoprazole]; Sulfa antibiotics; Aspirin; Caffeine; Sulfanilamide; and Methocarbamol   Social History   Socioeconomic History  . Marital status: Married    Spouse name: Not on file  . Number of children: Not on file  . Years of education: Not on file  . Highest education level: Not on file  Occupational History  . Not on file  Social Needs  . Financial resource strain: Not on file  . Food insecurity:    Worry: Not on file    Inability: Not on file  . Transportation needs:    Medical: Not on file    Non-medical: Not on file  Tobacco Use  . Smoking status: Never Smoker  . Smokeless tobacco: Never Used  Substance and Sexual Activity  . Alcohol use: No    Alcohol/week: 0.0 oz  . Drug use: No  . Sexual activity: Yes    Birth control/protection: Surgical    Comment: 1st intercourse 77 yo-1 partner  Lifestyle  . Physical activity:    Days per week: Not on file    Minutes per session: Not on file  . Stress: Not on file  Relationships  . Social connections:    Talks on phone: Not on file    Gets together: Not on file    Attends religious service: Not on file    Active member of club or organization: Not on file    Attends meetings of clubs or organizations: Not on file    Relationship status: Not on file  Other Topics Concern  . Not on file  Social History Narrative  . Not on file     Family History: The patient's family history includes Heart disease in her brother and mother; Seizures in her mother. ROS:   Please see the history of present illness.     All other systems reviewed and are negative.  EKGs/Labs/Other Studies Reviewed:    The following studies were reviewed today:  Echocardiogram  06/19/2016 Study Conclusions - Left ventricle: The cavity size was normal. There was mild focal   basal hypertrophy of the septum. Systolic function was normal.   The estimated ejection fraction was in the range of 60% to 65%.   Wall motion was normal; there were no regional wall motion   abnormalities. Doppler parameters are consistent with abnormal   left ventricular relaxation (grade  1 diastolic dysfunction). - Aortic valve: There was mild regurgitation. - Mitral valve: Calcified annulus.  Impressions: - Definity used; normal LV systolic function; grade 1 diastolic   dysfunction; sclerotic aortic valve with mild AI.  Carotid duplex 06/19/2016 Summary: Bilateral: intimal wall thickening CCA. Right: Mild mixed plaque origin ICA. Left: mild to moderate mixed plaque with acoustic shadowing origin ICA. Bilateral: 1-39% ICA plaquing. Vertebral artery flow is antegrade.  EKG:  EKG is not ordered today.    Recent Labs: 02/10/2018: ALT 25; B Natriuretic Peptide 6.7; BUN 17; Creatinine, Ser 0.69; Hemoglobin 11.9; Platelets 332; Potassium 4.0; Sodium 138   Recent Lipid Panel    Component Value Date/Time   CHOL 149 01/30/2018 0830   TRIG 100 01/30/2018 0830   TRIG 153 (H) 08/14/2017 0822   HDL 62 01/30/2018 0830   HDL 54 08/14/2017 0822   CHOLHDL 2.4 01/30/2018 0830   CHOLHDL 3.4 06/18/2016 0447   VLDL 24 06/18/2016 0447   LDLCALC 67 01/30/2018 0830    Physical Exam:    VS:  BP 138/78   Pulse 85   Ht  (1.626 m)   Wt 180 lb 3.2 oz (81.7 kg)   SpO2 99%   BMI 30.93 kg/m     Wt Readings from Last 3 Encounters:  05/16/18 180 lb 3.2 oz (81.7 kg)  04/20/18 180 lb (81.6 kg)  03/27/18 182 lb (82.6 kg)     Physical Exam  Constitutional: She is oriented to person, place, and time. She appears well-developed and well-nourished. No distress.  HENT:  Head: Normocephalic and atraumatic.  Neck: Normal range of motion. Neck supple. No JVD present.  Cardiovascular: Normal rate,  regular rhythm, normal heart sounds and intact distal pulses. Exam reveals no gallop and no friction rub.  No murmur heard. Pulmonary/Chest: Effort normal and breath sounds normal. No respiratory distress. She has no wheezes. She has no rales.  Abdominal: Soft. Bowel sounds are normal.  Musculoskeletal: Normal range of motion. She exhibits no edema.  Neurological: She is alert and oriented to person, place, and time.  Skin: Skin is warm and dry.  Psychiatric: She has a normal mood and affect. Her behavior is normal. Thought content normal.  Vitals reviewed.   ASSESSMENT:    1. Essential (primary) hypertension   2. Hyperlipidemia, unspecified hyperlipidemia type    PLAN:    In order of problems listed above:  Hypertension: Stage 1. Not on medication. BP is mildly elevated 120's-140's/70's-80's with occ SBP up to upper 140's-150. We discussed lifestyle changes. Pt is advised on DASH diet, low sodium and exercise. She is limited with exercise due to right knee issues. She has a stationary bike at home and will try to get back to using it. We discussed her carotid artery disease and importance of blood pressure control to prevent progression. Goal BP < 130/80. She would like to begin medication for improved BP control. Will add amlodipine 5 mg daily and recheck in 3 months. She will continue to monitor home BP.   History of TIA on Plavix. Unclear if seizure or TIA in 2017. Has not had seizure or symptoms since.  Bilateral carotid artery plaquing with 1-39% stenosis. On Plavix.   Hyperlipidemia: On pravastatin 40 mg.  LDL was 67 in 01/2018.  Continue current therapy   Medication Adjustments/Labs and Tests Ordered: Current medicines are reviewed at length with the patient today.  Concerns regarding medicines are outlined above. Labs and tests ordered and medication changes are outlined in  the patient instructions below:  Patient Instructions  Medication Instructions:  Your physician has  recommended you make the following change in your medication:  START: Amlodipine 5 mg (1 tablet) one time a day   If you need a refill on your cardiac medications, please contact your pharmacy first.  Labwork: None ordered   Testing/Procedures: None ordered   Follow-Up: Your physician recommends that you schedule a follow-up appointment in: 3 months with Lizabeth Leyden, NP    Any Other Special Instructions Will Be Listed Below (If Applicable).  DASH Eating Plan DASH stands for "Dietary Approaches to Stop Hypertension." The DASH eating plan is a healthy eating plan that has been shown to reduce high blood pressure (hypertension). It may also reduce your risk for type 2 diabetes, heart disease, and stroke. The DASH eating plan may also help with weight loss. What are tips for following this plan? General guidelines  Avoid eating more than 2,300 mg (milligrams) of salt (sodium) a day. If you have hypertension, you may need to reduce your sodium intake to 1,500 mg a day.  Limit alcohol intake to no more than 1 drink a day for nonpregnant women and 2 drinks a day for men. One drink equals 12 oz of beer, 5 oz of wine, or 1 oz of hard liquor.  Work with your health care provider to maintain a healthy body weight or to lose weight. Ask what an ideal weight is for you.  Get at least 30 minutes of exercise that causes your heart to beat faster (aerobic exercise) most days of the week. Activities may include walking, swimming, or biking.  Work with your health care provider or diet and nutrition specialist (dietitian) to adjust your eating plan to your individual calorie needs. Reading food labels  Check food labels for the amount of sodium per serving. Choose foods with less than 5 percent of the Daily Value of sodium. Generally, foods with less than 300 mg of sodium per serving fit into this eating plan.  To find whole grains, look for the word "whole" as the first word in the ingredient  list. Shopping  Buy products labeled as "low-sodium" or "no salt added."  Buy fresh foods. Avoid canned foods and premade or frozen meals. Cooking  Avoid adding salt when cooking. Use salt-free seasonings or herbs instead of table salt or sea salt. Check with your health care provider or pharmacist before using salt substitutes.  Do not fry foods. Cook foods using healthy methods such as baking, boiling, grilling, and broiling instead.  Cook with heart-healthy oils, such as olive, canola, soybean, or sunflower oil. Meal planning   Eat a balanced diet that includes: ? 5 or more servings of fruits and vegetables each day. At each meal, try to fill half of your plate with fruits and vegetables. ? Up to 6-8 servings of whole grains each day. ? Less than 6 oz of lean meat, poultry, or fish each day. A 3-oz serving of meat is about the same size as a deck of cards. One egg equals 1 oz. ? 2 servings of low-fat dairy each day. ? A serving of nuts, seeds, or beans 5 times each week. ? Heart-healthy fats. Healthy fats called Omega-3 fatty acids are found in foods such as flaxseeds and coldwater fish, like sardines, salmon, and mackerel.  Limit how much you eat of the following: ? Canned or prepackaged foods. ? Food that is high in trans fat, such as fried foods. ? Food that  is high in saturated fat, such as fatty meat. ? Sweets, desserts, sugary drinks, and other foods with added sugar. ? Full-fat dairy products.  Do not salt foods before eating.  Try to eat at least 2 vegetarian meals each week.  Eat more home-cooked food and less restaurant, buffet, and fast food.  When eating at a restaurant, ask that your food be prepared with less salt or no salt, if possible. What foods are recommended? The items listed may not be a complete list. Talk with your dietitian about what dietary choices are best for you. Grains Whole-grain or whole-wheat bread. Whole-grain or whole-wheat pasta. Brown  rice. Oatmeal. Quinoa. Bulgur. Whole-grain and low-sodium cereals. Pita bread. Low-fat, low-sodium crackers. Whole-wheat flour tortillas. Vegetables Fresh or frozen vegetables (raw, steamed, roasted, or grilled). Low-sodium or reduced-sodium tomato and vegetable juice. Low-sodium or reduced-sodium tomato sauce and tomato paste. Low-sodium or reduced-sodium canned vegetables. Fruits All fresh, dried, or frozen fruit. Canned fruit in natural juice (without added sugar). Meat and other protein foods Skinless chicken or turkey. Ground chicken or turkey. Pork with fat trimmed off. Fish and seafood. Egg whites. Dried beans, peas, or lentils. Unsalted nuts, nut butters, and seeds. Unsalted canned beans. Lean cuts of beef with fat trimmed off. Low-sodium, lean deli meat. Dairy Low-fat (1%) or fat-free (skim) milk. Fat-free, low-fat, or reduced-fat cheeses. Nonfat, low-sodium ricotta or cottage cheese. Low-fat or nonfat yogurt. Low-fat, low-sodium cheese. Fats and oils Soft margarine without trans fats. Vegetable oil. Low-fat, reduced-fat, or light mayonnaise and salad dressings (reduced-sodium). Canola, safflower, olive, soybean, and sunflower oils. Avocado. Seasoning and other foods Herbs. Spices. Seasoning mixes without salt. Unsalted popcorn and pretzels. Fat-free sweets. What foods are not recommended? The items listed may not be a complete list. Talk with your dietitian about what dietary choices are best for you. Grains Baked goods made with fat, such as croissants, muffins, or some breads. Dry pasta or rice meal packs. Vegetables Creamed or fried vegetables. Vegetables in a cheese sauce. Regular canned vegetables (not low-sodium or reduced-sodium). Regular canned tomato sauce and paste (not low-sodium or reduced-sodium). Regular tomato and vegetable juice (not low-sodium or reduced-sodium). Pickles. Olives. Fruits Canned fruit in a light or heavy syrup. Fried fruit. Fruit in cream or butter  sauce. Meat and other protein foods Fatty cuts of meat. Ribs. Fried meat. Bacon. Sausage. Bologna and other processed lunch meats. Salami. Fatback. Hotdogs. Bratwurst. Salted nuts and seeds. Canned beans with added salt. Canned or smoked fish. Whole eggs or egg yolks. Chicken or turkey with skin. Dairy Whole or 2% milk, cream, and half-and-half. Whole or full-fat cream cheese. Whole-fat or sweetened yogurt. Full-fat cheese. Nondairy creamers. Whipped toppings. Processed cheese and cheese spreads. Fats and oils Butter. Stick margarine. Lard. Shortening. Ghee. Bacon fat. Tropical oils, such as coconut, palm kernel, or palm oil. Seasoning and other foods Salted popcorn and pretzels. Onion salt, garlic salt, seasoned salt, table salt, and sea salt. Worcestershire sauce. Tartar sauce. Barbecue sauce. Teriyaki sauce. Soy sauce, including reduced-sodium. Steak sauce. Canned and packaged gravies. Fish sauce. Oyster sauce. Cocktail sauce. Horseradish that you find on the shelf. Ketchup. Mustard. Meat flavorings and tenderizers. Bouillon cubes. Hot sauce and Tabasco sauce. Premade or packaged marinades. Premade or packaged taco seasonings. Relishes. Regular salad dressings. Where to find more information:  National Heart, Lung, and Blood Institute: www.nhlbi.nih.gov  American Heart Association: www.heart.org Summary  The DASH eating plan is a healthy eating plan that has been shown to reduce high blood pressure (hypertension).   It may also reduce your risk for type 2 diabetes, heart disease, and stroke.  With the DASH eating plan, you should limit salt (sodium) intake to 2,300 mg a day. If you have hypertension, you may need to reduce your sodium intake to 1,500 mg a day.  When on the DASH eating plan, aim to eat more fresh fruits and vegetables, whole grains, lean proteins, low-fat dairy, and heart-healthy fats.  Work with your health care provider or diet and nutrition specialist (dietitian) to adjust  your eating plan to your individual calorie needs. This information is not intended to replace advice given to you by your health care provider. Make sure you discuss any questions you have with your health care provider. Document Released: 11/24/2011 Document Revised: 11/28/2016 Document Reviewed: 11/28/2016 Elsevier Interactive Patient Education  2018 [employer].  Amlodipine tablets What is this medicine? AMLODIPINE (am LOE di peen) is a calcium-channel blocker. It affects the amount of calcium found in your heart and muscle cells. This relaxes your blood vessels, which can reduce the amount of work the heart has to do. This medicine is used to lower high blood pressure. It is also used to prevent chest pain. This medicine may be used for other purposes; ask your health care provider or pharmacist if you have questions. COMMON BRAND NAME(S): Norvasc What should I tell my health care provider before I take this medicine? They need to know if you have any of these conditions: -heart problems like heart failure or aortic stenosis -liver disease -an unusual or allergic reaction to amlodipine, other medicines, foods, dyes, or preservatives -pregnant or trying to get pregnant -breast-feeding How should I use this medicine? Take this medicine by mouth with a glass of water. Follow the directions on the prescription label. Take your medicine at regular intervals. Do not take more medicine than directed. Talk to your pediatrician regarding the use of this medicine in children. Special care may be needed. This medicine has been used in children as young as 6. Persons over 85 years old may have a stronger reaction to this medicine and need smaller doses. Overdosage: If you think you have taken too much of this medicine contact a poison control center or emergency room at once. NOTE: This medicine is only for you. Do not share this medicine with others. What if I miss a dose? If you miss a dose,  take it as soon as you can. If it is almost time for your next dose, take only that dose. Do not take double or extra doses. What may interact with this medicine? -herbal or dietary supplements -local or general anesthetics -medicines for high blood pressure -medicines for prostate problems -rifampin This list may not describe all possible interactions. Give your health care provider a list of all the medicines, herbs, non-prescription drugs, or dietary supplements you use. Also tell them if you smoke, drink alcohol, or use illegal drugs. Some items may interact with your medicine. What should I watch for while using this medicine? Visit your doctor or health care professional for regular check ups. Check your blood pressure and pulse rate regularly. Ask your health care professional what your blood pressure and pulse rate should be, and when you should contact him or her. This medicine may make you feel confused, dizzy or lightheaded. Do not drive, use machinery, or do anything that needs mental alertness until you know how this medicine affects you. To reduce the risk of dizzy or fainting spells, do not sit  or stand up quickly, especially if you are an older patient. Avoid alcoholic drinks; they can make you more dizzy. Do not suddenly stop taking amlodipine. Ask your doctor or health care professional how you can gradually reduce the dose. What side effects may I notice from receiving this medicine? Side effects that you should report to your doctor or health care professional as soon as possible: -allergic reactions like skin rash, itching or hives, swelling of the face, lips, or tongue -breathing problems -changes in vision or hearing -chest pain -fast, irregular heartbeat -swelling of legs or ankles Side effects that usually do not require medical attention (report to your doctor or health care professional if they continue or are bothersome): -dry mouth -facial flushing -nausea,  vomiting -stomach gas, pain -tired, weak -trouble sleeping This list may not describe all possible side effects. Call your doctor for medical advice about side effects. You may report side effects to FDA at 1-800-FDA-1088. Where should I keep my medicine? Keep out of the reach of children. Store at room temperature between 59 and 86 degrees F (15 and 30 degrees C). Protect from light. Keep container tightly closed. Throw away any unused medicine after the expiration date. NOTE: This sheet is a summary. It may not cover all possible information. If you have questions about this medicine, talk to your doctor, pharmacist, or health care provider.  2018 Elsevier/Gold Standard (2012-11-02 11:40:58)    Thank you for choosing [facility]    Lyda Perone, RN  [phone]  If you need a refill on your cardiac medications before your next appointment, please call your pharmacy.      Signed, Berton Bon, NP  05/16/2018 10:08 AM    [facility]

## 2018-08-15 ENCOUNTER — Telehealth: Payer: Self-pay | Admitting: Internal Medicine

## 2018-08-15 NOTE — Telephone Encounter (Signed)
New Message        Patient need lab order put in.

## 2018-08-22 NOTE — Telephone Encounter (Signed)
Patient was last seen by J. Jeraldine Loots, NP and has f/u on 09/11/18 with her as well. I will forward to Cleotis Nipper, NP and her covering CMA to see if any lab orders will be scheduled prior to next appointment.

## 2018-09-04 ENCOUNTER — Telehealth: Payer: Self-pay | Admitting: Cardiology

## 2018-09-04 NOTE — Telephone Encounter (Signed)
° °  Patient following up on request for lab order, prior to appt on 9/24

## 2018-09-04 NOTE — Telephone Encounter (Signed)
Per Rhonda Norris, Rhonda Norris's MA, pt will not need labs prior to her appointment. Pt is aware and will come in fasting in case she will need lipids drawn.

## 2018-09-11 ENCOUNTER — Encounter: Payer: Self-pay | Admitting: Cardiology

## 2018-09-11 ENCOUNTER — Ambulatory Visit: Payer: Medicare Other | Admitting: Cardiology

## 2018-09-11 ENCOUNTER — Ambulatory Visit (INDEPENDENT_AMBULATORY_CARE_PROVIDER_SITE_OTHER): Payer: Medicare Other | Admitting: Cardiology

## 2018-09-11 ENCOUNTER — Encounter (INDEPENDENT_AMBULATORY_CARE_PROVIDER_SITE_OTHER): Payer: Self-pay

## 2018-09-11 VITALS — BP 142/80 | HR 84 | Ht 64.0 in | Wt 174.1 lb

## 2018-09-11 DIAGNOSIS — I10 Essential (primary) hypertension: Secondary | ICD-10-CM | POA: Diagnosis not present

## 2018-09-11 DIAGNOSIS — Z8673 Personal history of transient ischemic attack (TIA), and cerebral infarction without residual deficits: Secondary | ICD-10-CM | POA: Diagnosis not present

## 2018-09-11 DIAGNOSIS — E119 Type 2 diabetes mellitus without complications: Secondary | ICD-10-CM

## 2018-09-11 DIAGNOSIS — E785 Hyperlipidemia, unspecified: Secondary | ICD-10-CM

## 2018-09-11 LAB — BASIC METABOLIC PANEL
BUN/Creatinine Ratio: 20 (ref 12–28)
BUN: 15 mg/dL (ref 8–27)
CO2: 25 mmol/L (ref 20–29)
Calcium: 10.4 mg/dL — ABNORMAL HIGH (ref 8.7–10.3)
Chloride: 99 mmol/L (ref 96–106)
Creatinine, Ser: 0.75 mg/dL (ref 0.57–1.00)
GFR calc Af Amer: 95 mL/min/{1.73_m2} (ref >59)
GFR, EST NON AFRICAN AMERICAN: 83 mL/min/{1.73_m2} (ref >59)
Glucose: 103 mg/dL — ABNORMAL HIGH (ref 65–99)
Potassium: 4.5 mmol/L (ref 3.5–5.2)
Sodium: 140 mmol/L (ref 134–144)

## 2018-09-11 MED ORDER — PRAVASTATIN SODIUM 40 MG PO TABS: 40.0000 mg | ORAL_TABLET | Freq: Every evening | ORAL | 3 refills | Status: DC

## 2018-09-11 NOTE — Patient Instructions (Signed)
Medication Instructions: Your physician recommends that you continue on your current medications as directed. Please refer to the Current Medication list given to you today.   Labwork: TODAY: BMET  Procedures/Testing: NONE   Follow-Up: Your physician wants you to follow-up in: 6 months with Dr. Filbert Schildeross You will receive a reminder letter in the mail two months in advance. If you don't receive a letter, please call our office to schedule the follow-up appointment.   Any Additional Special Instructions Will Be Listed Below (If Applicable).   DASH Eating Plan DASH stands for "Dietary Approaches to Stop Hypertension." The DASH eating plan is a healthy eating plan that has been shown to reduce high blood pressure (hypertension). It may also reduce your risk for type 2 diabetes, heart disease, and stroke. The DASH eating plan may also help with weight loss. What are tips for following this plan? General guidelines  Avoid eating more than 2,300 mg (milligrams) of salt (sodium) a day. If you have hypertension, you may need to reduce your sodium intake to 1,500 mg a day.  Limit alcohol intake to no more than 1 drink a day for nonpregnant women and 2 drinks a day for men. One drink equals 12 oz of beer, 5 oz of wine, or 1 oz of hard liquor.  Work with your health care provider to maintain a healthy body weight or to lose weight. Ask what an ideal weight is for you.  Get at least 30 minutes of exercise that causes your heart to beat faster (aerobic exercise) most days of the week. Activities may include walking, swimming, or biking.  Work with your health care provider or diet and nutrition specialist (dietitian) to adjust your eating plan to your individual calorie needs. Reading food labels  Check food labels for the amount of sodium per serving. Choose foods with less than 5 percent of the Daily Value of sodium. Generally, foods with less than 300 mg of sodium per serving fit into this eating  plan.  To find whole grains, look for the word "whole" as the first word in the ingredient list. Shopping  Buy products labeled as "low-sodium" or "no salt added."  Buy fresh foods. Avoid canned foods and premade or frozen meals. Cooking  Avoid adding salt when cooking. Use salt-free seasonings or herbs instead of table salt or sea salt. Check with your health care provider or pharmacist before using salt substitutes.  Do not fry foods. Cook foods using healthy methods such as baking, boiling, grilling, and broiling instead.  Cook with heart-healthy oils, such as olive, canola, soybean, or sunflower oil. Meal planning   Eat a balanced diet that includes: ? 5 or more servings of fruits and vegetables each day. At each meal, try to fill half of your plate with fruits and vegetables. ? Up to 6-8 servings of whole grains each day. ? Less than 6 oz of lean meat, poultry, or fish each day. A 3-oz serving of meat is about the same size as a deck of cards. One egg equals 1 oz. ? 2 servings of low-fat dairy each day. ? A serving of nuts, seeds, or beans 5 times each week. ? Heart-healthy fats. Healthy fats called Omega-3 fatty acids are found in foods such as flaxseeds and coldwater fish, like sardines, salmon, and mackerel.  Limit how much you eat of the following: ? Canned or prepackaged foods. ? Food that is high in trans fat, such as fried foods. ? Food that is high in  saturated fat, such as fatty meat. ? Sweets, desserts, sugary drinks, and other foods with added sugar. ? Full-fat dairy products.  Do not salt foods before eating.  Try to eat at least 2 vegetarian meals each week.  Eat more home-cooked food and less restaurant, buffet, and fast food.  When eating at a restaurant, ask that your food be prepared with less salt or no salt, if possible. What foods are recommended? The items listed may not be a complete list. Talk with your dietitian about what dietary choices are best  for you. Grains Whole-grain or whole-wheat bread. Whole-grain or whole-wheat pasta. Brown rice. [place]. Bulgur. Whole-grain and low-sodium cereals. Pita bread. Low-fat, low-sodium crackers. Whole-wheat flour tortillas. Vegetables Fresh or frozen vegetables (raw, steamed, roasted, or grilled). Low-sodium or reduced-sodium tomato and vegetable juice. Low-sodium or reduced-sodium tomato sauce and tomato paste. Low-sodium or reduced-sodium canned vegetables. Fruits All fresh, dried, or frozen fruit. Canned fruit in natural juice (without added sugar). Meat and other protein foods Skinless chicken or [place]. Ground chicken or [place]. Pork with fat trimmed off. Fish and seafood. Egg whites. Dried beans, peas, or lentils. Unsalted nuts, nut butters, and seeds. Unsalted canned beans. Lean cuts of beef with fat trimmed off. Low-sodium, lean deli meat. Dairy Low-fat (1%) or fat-free (skim) milk. Fat-free, low-fat, or reduced-fat cheeses. Nonfat, low-sodium ricotta or cottage cheese. Low-fat or nonfat yogurt. Low-fat, low-sodium cheese. Fats and oils Soft margarine without trans fats. Vegetable oil. Low-fat, reduced-fat, or light mayonnaise and salad dressings (reduced-sodium). Canola, safflower, olive, soybean, and sunflower oils. Avocado. Seasoning and other foods Herbs. Spices. Seasoning mixes without salt. Unsalted popcorn and pretzels. Fat-free sweets. What foods are not recommended? The items listed may not be a complete list. Talk with your dietitian about what dietary choices are best for you. Grains Baked goods made with fat, such as croissants, muffins, or some breads. Dry pasta or rice meal packs. Vegetables Creamed or fried vegetables. Vegetables in a cheese sauce. Regular canned vegetables (not low-sodium or reduced-sodium). Regular canned tomato sauce and paste (not low-sodium or reduced-sodium). Regular tomato and vegetable juice (not low-sodium or reduced-sodium). Angie Fava.  Olives. Fruits Canned fruit in a light or heavy syrup. Fried fruit. Fruit in cream or butter sauce. Meat and other protein foods Fatty cuts of meat. Ribs. Fried meat. Berniece Salines. Sausage. Bologna and other processed lunch meats. Salami. Fatback. Hotdogs. Bratwurst. Salted nuts and seeds. Canned beans with added salt. Canned or smoked fish. Whole eggs or egg yolks. Chicken or [place] with skin. Dairy Whole or 2% milk, cream, and half-and-half. Whole or full-fat cream cheese. Whole-fat or sweetened yogurt. Full-fat cheese. Nondairy creamers. Whipped toppings. Processed cheese and cheese spreads. Fats and oils Butter. Stick margarine. Lard. Shortening. Ghee. Bacon fat. Tropical oils, such as coconut, palm kernel, or palm oil. Seasoning and other foods Salted popcorn and pretzels. Onion salt, garlic salt, seasoned salt, table salt, and sea salt. Worcestershire sauce. Tartar sauce. Barbecue sauce. Teriyaki sauce. Soy sauce, including reduced-sodium. Steak sauce. Canned and packaged gravies. Fish sauce. Oyster sauce. Cocktail sauce. Horseradish that you find on the shelf. Ketchup. Mustard. Meat flavorings and tenderizers. Bouillon cubes. Hot sauce and Tabasco sauce. Premade or packaged marinades. Premade or packaged taco seasonings. Relishes. Regular salad dressings. Where to find more information:  National Heart, Lung, and [place]: https://wilson-eaton.com/  American Heart Association: www.heart.org Summary  The DASH eating plan is a healthy eating plan that has been shown to reduce high blood pressure (hypertension). It may also  reduce your risk for type 2 diabetes, heart disease, and stroke.  With the DASH eating plan, you should limit salt (sodium) intake to 2,300 mg a day. If you have hypertension, you may need to reduce your sodium intake to 1,500 mg a day.  When on the DASH eating plan, aim to eat more fresh fruits and vegetables, whole grains, lean proteins, low-fat dairy, and heart-healthy  fats.  Work with your health care provider or diet and nutrition specialist (dietitian) to adjust your eating plan to your individual calorie needs. This information is not intended to replace advice given to you by your health care provider. Make sure you discuss any questions you have with your health care provider. Document Released: 11/24/2011 Document Revised: 11/28/2016 Document Reviewed: 11/28/2016 [employer] Interactive Patient Education  2018 [employer]    If you need a refill on your cardiac medications before your next appointment, please call your pharmacy.   

## 2018-09-11 NOTE — Progress Notes (Signed)
Cardiology Office Note:    Date:  09/11/2018   ID:  Rhonda Norris, DOB 1951/04/18, MRN [id number]  PCP:  Catha Gosselin, MD  Cardiologist:  Dietrich Pates, MD  Referring MD: Catha Gosselin, MD   Chief Complaint  Patient presents with  . Follow-up    Hypertension    History of Present Illness:    Rhonda Norris is a 67 y.o. female with a past medical history significant for  hyperlipidemia, type 2 diabetes, TIA vs seizure (2017).   I last saw her on 05/16/18 at which time her blood pressure was better but not ideal. After discussion with her regarding the guidelines of BP <130/80 she was started on amlodipine 5 mg for BP management. She is here today for follow up of blood pressure.    Since her last appt she has torn the meniscus in her right knee and will need surgery. She had been walking 10k steps per day without any exertional symptoms until her knee injury. She plans to get back to exercise once she has her knee repaired. She started weight Watchers about 3 weeks ago. She has already lost 6 lbs. She is working on diet changes.   Home BP's 125-130/70's. Her BP is up this am but she has not taken her medications.   Past Medical History:  Diagnosis Date  . Allergic rhinitis   . Arthritis   . Carpal tunnel syndrome   . Diabetes mellitus without complication (HCC) 02/08/2017   not on medication  . Elevated cholesterol   . Fibromyalgia   . GERD (gastroesophageal reflux disease)   . IBD (inflammatory bowel disease)   . Osteopenia 03/2018   T score -2.1 FRAX 3.7% / 0.5%  . Pancreatitis, gallstone   . Stroke [facility])     Past Surgical History:  Procedure Laterality Date  . ANTERIOR CERVICAL DECOMP/DISCECTOMY FUSION N/A 02/15/2017   Procedure: ACDF C4-5, removal of hardware and exploration of fusion C5-6;  Surgeon: Venita Lick, MD;  Location: [facility] OR;  Service: Orthopedics;  Laterality: N/A;  . APPENDECTOMY    . 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  CHOLECYSTECTOMY N/A 08/17/2015   Procedure: LAPAROSCOPIC CHOLECYSTECTOMY WITH INTRAOPERATIVE CHOLANGIOGRAM;  Surgeon: Abigail Miyamoto, MD;  Location: [facility] OR;  Service: General;  Laterality: N/A;  . COLPOSCOPY    . HAND SURGERY     bilateral  . KNEE ARTHROSCOPY     rt  . NECK SURGERY    . PELVIC LAPAROSCOPY  1992/1996   endometriosis  . TUBAL LIGATION    . vaginal hysterectomy bilateral salpingo-oophorectomy  11/1996   [facility] BSO endometriosis/leiomyomata    Current Medications: Current Meds  Medication Sig  . amLODipine (NORVASC) 5 MG tablet Take 1 tablet (5 mg total) by mouth daily.  . Ascorbic Acid (VITAMIN C) 500 MG CAPS Take 500 mg by mouth daily.   Marland Kitchen BIOTIN PO Take 1 tablet by mouth daily.  . Calcium Carbonate-Vitamin D (CALCIUM 600+D) 600-200 MG-UNIT TABS Take 1 tablet by mouth daily.   . cetirizine (ZYRTEC) 10 MG tablet Take 10 mg by mouth daily.  . clopidogrel (PLAVIX) 75 MG tablet Take 1 tablet (75 mg total) by mouth daily.  . Cranberry 250 MG CAPS Take 250 mg by mouth daily as needed (for UTI prevention).   . diphenhydrAMINE (BENADRYL) 25 MG tablet Take 25 mg by mouth every 6 (six) hours as needed for allergies.  . Flaxseed, Linseed, (FLAXSEED OIL) 1000  MG CAPS Take 1,000 mg by mouth daily.   . fluticasone (FLONASE) 50 MCG/ACT nasal spray Place 2 sprays into the nose daily.   . Ginkgo Biloba 120 MG CAPS Take 120 mg by mouth daily.  . Glucosamine HCl 1500 MG TABS Take 1,500 mg by mouth daily.   . Melatonin 3 MG TABS Take 3 mg by mouth at bedtime as needed (for sleep).   . metFORMIN (GLUCOPHAGE) 500 MG tablet Take by mouth 2 (two) times daily with a meal.  . Misc Natural Products (OSTEO BI-FLEX/5-LOXIN ADVANCED) TABS Take 1 tablet by mouth daily.  . mometasone (NASONEX) 50 MCG/ACT nasal spray Place 50 mcg into the nose daily.  . Multiple Vitamin (MULTIVITAMIN WITH MINERALS) TABS tablet Take 1 tablet by mouth daily.  . nortriptyline (PAMELOR) 25 MG capsule Take 25 mg by mouth at  bedtime.   . Omega-3 Fatty Acids (FISH OIL) 1000 MG CAPS Take 1,000 mg by mouth daily.  . polyethylene glycol (MIRALAX / GLYCOLAX) packet Take 17 g by mouth daily as needed for moderate constipation.   . pravastatin (PRAVACHOL) 40 MG tablet Take 1 tablet (40 mg total) by mouth every evening.  . ranitidine (ZANTAC) 150 MG tablet Take 150 mg by mouth daily as needed for heartburn.  . Tetrahydrozoline HCl (VISINE OP) Apply 1 drop to eye daily.  . vitamin E 100 UNIT capsule Take 100 Units by mouth daily.   . [DISCONTINUED] pravastatin (PRAVACHOL) 40 MG tablet Take 1 tablet (40 mg total) every evening by mouth.     Allergies:   Dexilant [dexlansoprazole]; Sulfa antibiotics; Aspirin; Caffeine; Sulfanilamide; and Methocarbamol   Social History   Socioeconomic History  . Marital status: Married    Spouse name: Not on file  . Number of children: Not on file  . Years of education: Not on file  . Highest education level: Not on file  Occupational History  . Not on file  Social Needs  . Financial resource strain: Not on file  . Food insecurity:    Worry: Not on file    Inability: Not on file  . Transportation needs:    Medical: Not on file    Non-medical: Not on file  Tobacco Use  . Smoking status: Never Smoker  . Smokeless tobacco: Never Used  Substance and Sexual Activity  . Alcohol use: No    Alcohol/week: 0.0 standard drinks  . Drug use: No  . Sexual activity: Yes    Birth control/protection: Surgical    Comment: 1st intercourse 73 yo-1 partner  Lifestyle  . Physical activity:    Days per week: Not on file    Minutes per session: Not on file  . Stress: Not on file  Relationships  . Social connections:    Talks on phone: Not on file    Gets together: Not on file    Attends religious service: Not on file    Active member of club or organization: Not on file    Attends meetings of clubs or organizations: Not on file    Relationship status: Not on file  Other Topics Concern    . Not on file  Social History Narrative  . Not on file     Family History: The patient's family history includes Heart disease in her brother and mother; Seizures in her mother. ROS:   Please see the history of present illness.     All other systems reviewed and are negative.  EKGs/Labs/Other Studies Reviewed:    The  following studies were reviewed today:  Echocardiogram 06/19/2016 Study Conclusions - Left ventricle: The cavity size was normal. There was mild focal basal hypertrophy of the septum. Systolic function was normal. The estimated ejection fraction was in the range of 60% to 65%. Wall motion was normal; there were no regional wall motion abnormalities. Doppler parameters are consistent with abnormal left ventricular relaxation (grade 1 diastolic dysfunction). - Aortic valve: There was mild regurgitation. - Mitral valve: Calcified annulus.  Impressions: - Definity used; normal LV systolic function; grade 1 diastolic dysfunction; sclerotic aortic valve with mild AI.  Carotid duplex 06/19/2016 Summary: Bilateral: intimal wall thickening CCA. Right: Mild mixed plaque origin ICA. Left: mild to moderate mixed plaque with acoustic shadowing origin ICA. Bilateral: 1-39% ICA plaquing. Vertebral artery flow is antegrade.  EKG:  EKG is not ordered today.    Recent Labs: 02/10/2018: ALT 25; B Natriuretic Peptide 6.7; BUN 17; Creatinine, Ser 0.69; Hemoglobin 11.9; Platelets 332; Potassium 4.0; Sodium 138   Recent Lipid Panel    Component Value Date/Time   CHOL 149 01/30/2018 0830   TRIG 100 01/30/2018 0830   TRIG 153 (H) 08/14/2017 0822   HDL 62 01/30/2018 0830   HDL 54 08/14/2017 0822   CHOLHDL 2.4 01/30/2018 0830   CHOLHDL 3.4 06/18/2016 0447   VLDL 24 06/18/2016 0447   LDLCALC 67 01/30/2018 0830    Physical Exam:    VS:  BP (!) 142/80   Pulse 84   Ht 5\' 4"  (1.626 m)   Wt 174 lb 1.9 oz (79 kg)   SpO2 98%   BMI 29.89 kg/m     Wt Readings from  Last 3 Encounters:  09/11/18 174 lb 1.9 oz (79 kg)  05/16/18 180 lb 3.2 oz (81.7 kg)  04/20/18 180 lb (81.6 kg)    Physical Exam  Constitutional: She is oriented to person, place, and time. She appears well-developed and well-nourished. No distress.  HENT:  Head: Normocephalic and atraumatic.  Neck: Normal range of motion. Neck supple. No JVD present.  Cardiovascular: Normal rate, regular rhythm, normal heart sounds and intact distal pulses. Exam reveals no gallop and no friction rub.  No murmur heard. Pulmonary/Chest: Effort normal and breath sounds normal. No respiratory distress. She has no wheezes. She has no rales.  Abdominal: Soft. Bowel sounds are normal.  Musculoskeletal: Normal range of motion. She exhibits no edema.  Neurological: She is alert and oriented to person, place, and time.  Skin: Skin is warm and dry.  Psychiatric: She has a normal mood and affect. Her behavior is normal. Judgment and thought content normal.  Vitals reviewed.   ASSESSMENT:    1. Essential (primary) hypertension   2. History of TIA (transient ischemic attack)   3. Hyperlipidemia, unspecified hyperlipidemia type   4. Type 2 diabetes mellitus without complication, without long-term current use of insulin (HCC)    PLAN:    In order of problems listed above:  Hypertension: BP slightly improved with amlodipine per home BP readings and she is tolerating the medication well. BP elevated today but she has not atken her meds today. She is working on wt loss with [employer]. Follows a low sodium diet.  Last labs in 01/2018. WIll check BMet today for renal function.  She sees her PCP in October. Will have her F/U with Dr. Tenny Craw or me in 6 months.   History of TIA on Plavix: No new neurologic complaints.   Hyperlipidemia: On pravastatin 40 mg.  LDL was 67 in  01/2018.  Continue current therapy  Diabetes type 2: A1c 7.4 in 01/2018.  I discussed the goal of less than 7 with the patient.  We discussed  diet and exercise.  Hopefully her being on weight watchers will help improve her diabetes control.  I advised her to follow-up with her PCP in October to reassess.  Pt is planning to have knee surgery. She is low risk from a cardiac standpoint and can proceed with no further cardiac workup. Her Plavix is prescribed for TIA and managed by her PCP/Neuro. Permission to hold it for surgery would need to go through them.    Medication Adjustments/Labs and Tests Ordered: Current medicines are reviewed at length with the patient today.  Concerns regarding medicines are outlined above. Labs and tests ordered and medication changes are outlined in the patient instructions below:  Patient Instructions  Medication Instructions: Your physician recommends that you continue on your current medications as directed. Please refer to the Current Medication list given to you today.   Labwork: TODAY: BMET  Procedures/Testing: NONE   Follow-Up: Your physician wants you to follow-up in: 6 months with Dr. Filbert Schildeross You will receive a reminder letter in the mail two months in advance. If you don't receive a letter, please call our office to schedule the follow-up appointment.   Any Additional Special Instructions Will Be Listed Below (If Applicable).   DASH Eating Plan DASH stands for "Dietary Approaches to Stop Hypertension." The DASH eating plan is a healthy eating plan that has been shown to reduce high blood pressure (hypertension). It may also reduce your risk for type 2 diabetes, heart disease, and stroke. The DASH eating plan may also help with weight loss. What are tips for following this plan? General guidelines  Avoid eating more than 2,300 mg (milligrams) of salt (sodium) a day. If you have hypertension, you may need to reduce your sodium intake to 1,500 mg a day.  Limit alcohol intake to no more than 1 drink a day for nonpregnant women and 2 drinks a day for men. One drink equals 12 oz of beer, 5 oz  of wine, or 1 oz of hard liquor.  Work with your health care provider to maintain a healthy body weight or to lose weight. Ask what an ideal weight is for you.  Get at least 30 minutes of exercise that causes your heart to beat faster (aerobic exercise) most days of the week. Activities may include walking, swimming, or biking.  Work with your health care provider or diet and nutrition specialist (dietitian) to adjust your eating plan to your individual calorie needs. Reading food labels  Check food labels for the amount of sodium per serving. Choose foods with less than 5 percent of the Daily Value of sodium. Generally, foods with less than 300 mg of sodium per serving fit into this eating plan.  To find whole grains, look for the word "whole" as the first word in the ingredient list. Shopping  Buy products labeled as "low-sodium" or "no salt added."  Buy fresh foods. Avoid canned foods and premade or frozen meals. Cooking  Avoid adding salt when cooking. Use salt-free seasonings or herbs instead of table salt or sea salt. Check with your health care provider or pharmacist before using salt substitutes.  Do not fry foods. Cook foods using healthy methods such as baking, boiling, grilling, and broiling instead.  Cook with heart-healthy oils, such as olive, canola, soybean, or sunflower oil. Meal planning   Eat a  balanced diet that includes: ? 5 or more servings of fruits and vegetables each day. At each meal, try to fill half of your plate with fruits and vegetables. ? Up to 6-8 servings of whole grains each day. ? Less than 6 oz of lean meat, poultry, or fish each day. A 3-oz serving of meat is about the same size as a deck of cards. One egg equals 1 oz. ? 2 servings of low-fat dairy each day. ? A serving of nuts, seeds, or beans 5 times each week. ? Heart-healthy fats. Healthy fats called Omega-3 fatty acids are found in foods such as flaxseeds and coldwater fish, like sardines,  salmon, and mackerel.  Limit how much you eat of the following: ? Canned or prepackaged foods. ? Food that is high in trans fat, such as fried foods. ? Food that is high in saturated fat, such as fatty meat. ? Sweets, desserts, sugary drinks, and other foods with added sugar. ? Full-fat dairy products.  Do not salt foods before eating.  Try to eat at least 2 vegetarian meals each week.  Eat more home-cooked food and less restaurant, buffet, and fast food.  When eating at a restaurant, ask that your food be prepared with less salt or no salt, if possible. What foods are recommended? The items listed may not be a complete list. Talk with your dietitian about what dietary choices are best for you. Grains Whole-grain or whole-wheat bread. Whole-grain or whole-wheat pasta. Brown rice. Orpah Cobb. Bulgur. Whole-grain and low-sodium cereals. Pita bread. Low-fat, low-sodium crackers. Whole-wheat flour tortillas. Vegetables Fresh or frozen vegetables (raw, steamed, roasted, or grilled). Low-sodium or reduced-sodium tomato and vegetable juice. Low-sodium or reduced-sodium tomato sauce and tomato paste. Low-sodium or reduced-sodium canned vegetables. Fruits All fresh, dried, or frozen fruit. Canned fruit in natural juice (without added sugar). Meat and other protein foods Skinless chicken or [place]. Ground chicken or [place]. Pork with fat trimmed off. Fish and seafood. Egg whites. Dried beans, peas, or lentils. Unsalted nuts, nut butters, and seeds. Unsalted canned beans. Lean cuts of beef with fat trimmed off. Low-sodium, lean deli meat. Dairy Low-fat (1%) or fat-free (skim) milk. Fat-free, low-fat, or reduced-fat cheeses. Nonfat, low-sodium ricotta or cottage cheese. Low-fat or nonfat yogurt. Low-fat, low-sodium cheese. Fats and oils Soft margarine without trans fats. Vegetable oil. Low-fat, reduced-fat, or light mayonnaise and salad dressings (reduced-sodium). Canola, safflower, olive,  soybean, and sunflower oils. Avocado. Seasoning and other foods Herbs. Spices. Seasoning mixes without salt. Unsalted popcorn and pretzels. Fat-free sweets. What foods are not recommended? The items listed may not be a complete list. Talk with your dietitian about what dietary choices are best for you. Grains Baked goods made with fat, such as croissants, muffins, or some breads. Dry pasta or rice meal packs. Vegetables Creamed or fried vegetables. Vegetables in a cheese sauce. Regular canned vegetables (not low-sodium or reduced-sodium). Regular canned tomato sauce and paste (not low-sodium or reduced-sodium). Regular tomato and vegetable juice (not low-sodium or reduced-sodium). Rosita Fire. Olives. Fruits Canned fruit in a light or heavy syrup. Fried fruit. Fruit in cream or butter sauce. Meat and other protein foods Fatty cuts of meat. Ribs. Fried meat. Tomasa Blase. Sausage. Bologna and other processed lunch meats. Salami. Fatback. Hotdogs. Bratwurst. Salted nuts and seeds. Canned beans with added salt. Canned or smoked fish. Whole eggs or egg yolks. Chicken or [place] with skin. Dairy Whole or 2% milk, cream, and half-and-half. Whole or full-fat cream cheese. Whole-fat or sweetened yogurt. Full-fat cheese.  Nondairy creamers. Whipped toppings. Processed cheese and cheese spreads. Fats and oils Butter. Stick margarine. Lard. Shortening. Ghee. Bacon fat. Tropical oils, such as coconut, palm kernel, or palm oil. Seasoning and other foods Salted popcorn and pretzels. Onion salt, garlic salt, seasoned salt, table salt, and sea salt. Worcestershire sauce. Tartar sauce. Barbecue sauce. Teriyaki sauce. Soy sauce, including reduced-sodium. Steak sauce. Canned and packaged gravies. Fish sauce. Oyster sauce. Cocktail sauce. Horseradish that you find on the shelf. Ketchup. Mustard. Meat flavorings and tenderizers. Bouillon cubes. Hot sauce and Tabasco sauce. Premade or packaged marinades. Premade or packaged taco  seasonings. Relishes. Regular salad dressings. Where to find more information:  National Heart, Lung, and Blood Institute: PopSteam.is  American Heart Association: www.heart.org Summary  The DASH eating plan is a healthy eating plan that has been shown to reduce high blood pressure (hypertension). It may also reduce your risk for type 2 diabetes, heart disease, and stroke.  With the DASH eating plan, you should limit salt (sodium) intake to 2,300 mg a day. If you have hypertension, you may need to reduce your sodium intake to 1,500 mg a day.  When on the DASH eating plan, aim to eat more fresh fruits and vegetables, whole grains, lean proteins, low-fat dairy, and heart-healthy fats.  Work with your health care provider or diet and nutrition specialist (dietitian) to adjust your eating plan to your individual calorie needs. This information is not intended to replace advice given to you by your health care provider. Make sure you discuss any questions you have with your health care provider. Document Released: 11/24/2011 Document Revised: 11/28/2016 Document Reviewed: 11/28/2016 Elsevier Interactive Patient Education  [employer].    If you need a refill on your cardiac medications before your next appointment, please call your pharmacy.      Signed, Berton Bon, NP  09/11/2018 8:35 AM    [facility]

## 2018-09-13 ENCOUNTER — Telehealth: Payer: Self-pay | Admitting: Cardiology

## 2018-09-13 NOTE — Telephone Encounter (Signed)
Pt aware of lab results. No additional questions.  

## 2018-09-13 NOTE — Telephone Encounter (Signed)
Follow Up:     Returning Rhonda Norris's call from yesterday, concerning her lab results.

## 2018-11-22 ENCOUNTER — Other Ambulatory Visit: Payer: Self-pay | Admitting: Gynecology

## 2018-11-22 DIAGNOSIS — Z1231 Encounter for screening mammogram for malignant neoplasm of breast: Secondary | ICD-10-CM

## 2019-01-02 ENCOUNTER — Ambulatory Visit
Admission: RE | Admit: 2019-01-02 | Discharge: 2019-01-02 | Disposition: A | Discharge status: Home / Self Care | Payer: Medicare Other | Source: Ambulatory Visit | Attending: Gynecology | Admitting: Gynecology

## 2019-01-02 DIAGNOSIS — Z1231 Encounter for screening mammogram for malignant neoplasm of breast: Secondary | ICD-10-CM

## 2019-01-17 ENCOUNTER — Telehealth: Payer: Self-pay | Admitting: *Deleted

## 2019-01-17 NOTE — Telephone Encounter (Signed)
   [facility] Pre-operative Risk Assessment    Request for surgical clearance:  1. What type of surgery is being performed? RIGHT UKA MEDIAL  2. When is this surgery scheduled? 02/14/19   3. What type of clearance is required (medical clearance vs. Pharmacy clearance to hold med vs. Both)? MEDICAL  4. Are there any medications that need to be held prior to surgery and how long?PLAVIX    5. Practice name and name of physician performing surgery? [facility]; DR. Alvan Dame   6. What is your office phone number [phone]    7.   What is your office fax number [phone]  8.   Anesthesia type (None, local, MAC, general) ? SPINAL   Rhonda Norris 01/17/2019, 9:38 AM  _________________________________________________________________   (provider comments below)

## 2019-01-18 NOTE — Telephone Encounter (Signed)
   Primary Cardiologist: Dietrich Pates, MD  Chart reviewed as part of pre-operative protocol coverage.   Rhonda Norris was last seen on 09/11/18 by Darnelle Bos, NP, and was cleared for knee surgery at that time. Note in September outlines : Pt is planning to have knee surgery. She is low risk from a cardiac standpoint and can proceed with no further cardiac workup. Her Plavix is prescribed for TIA and managed by her PCP/Neuro. Permission to hold it for surgery would need to go through them.    I attempted phone call but was unable to reach patient. She will need f/u call to assess for any new cardiac symptoms since her last OV. If no symptoms, she can be cleared.   Status pending phone call.   Robbie Lis, PA-C 01/18/2019, 2:15 PM

## 2019-01-23 NOTE — Telephone Encounter (Signed)
   Primary Cardiologist: Dietrich Pates, MD  Chart reviewed as part of pre-operative protocol coverage. Patient was contacted 01/23/2019 in reference to pre-operative risk assessment for pending surgery as outlined below.  Rynn Barrick Acklin was last seen on 09/11/18 by Darnelle Bos, NP.  Since that day, Alexsandra Zettle Marcantel has done very well without chest pain or SOB and is able to meet 4 METs.  Her plavix is for TIA and will defer to Dr. Fatima Sanger for that decision.  Therefore, based on ACC/AHA guidelines, the patient would be at acceptable risk for the planned procedure without further cardiovascular testing.   I will route this recommendation to the requesting party via Epic fax function and remove from pre-op pool.  Please call with questions.  Nada Boozer, NP 01/23/2019, 11:57 AM

## 2019-02-04 NOTE — Progress Notes (Signed)
Need preop orders in epic.  Preop appt on 02/07/19.

## 2019-02-05 NOTE — Patient Instructions (Addendum)
Rhonda Norris  02/05/2019   Your procedure is scheduled on: 02-14-2019   Report to [facility] Main  Entrance    Report to admitting at 9:00AM    Call this number if you have problems the morning of surgery [phone]      Remember: Do not eat food or drink liquids :After Midnight. BRUSH YOUR TEETH MORNING OF SURGERY AND RINSE YOUR MOUTH OUT, NO CHEWING GUM CANDY OR MINTS.     Take these medicines the morning of surgery with A SIP OF WATER: Amlodipine   DO NOT TAKE ANY DIABETIC MEDICATIONS DAY OF YOUR SURGERY CHECK YOUR BLOOD SUGAR THE MORNING OF SURGERY. REPORT TO YOUR NURSE ON ARRIVAL                                 You may not have any metal on your body including hair pins and              piercings  Do not wear jewelry, make-up, lotions, powders or perfumes, deodorant             Do not wear nail polish.  Do not shave  48 hours prior to surgery.      Do not bring valuables to the hospital. [facility] IS NOT             RESPONSIBLE   FOR VALUABLES.  Contacts, dentures or bridgework may not be worn into surgery.  Leave suitcase in the car. After surgery it may be brought to your room.     Patients discharged the day of surgery will not be allowed to drive home. IF YOU ARE HAVING SURGERY AND GOING HOME THE SAME DAY, YOU MUST HAVE AN ADULT TO DRIVE YOU HOME AND BE WITH YOU FOR 24 HOURS. YOU MAY GO HOME BY TAXI OR UBER OR ORTHERWISE, BUT AN ADULT MUST ACCOMPANY YOU HOME AND STAY WITH YOU FOR 24 HOURS.  Name and phone number of your driver:  Special Instructions: N/A              Please read over the following fact sheets you were given: _____________________________________________________________________             [facility] - Preparing for Surgery Before surgery, you can play an important role.  Because skin is not sterile, your skin needs to be as free of germs as possible.  You can reduce the number of germs on your skin by  washing with CHG (chlorahexidine gluconate) soap before surgery.  CHG is an antiseptic cleaner which kills germs and bonds with the skin to continue killing germs even after washing. Please DO NOT use if you have an allergy to CHG or antibacterial soaps.  If your skin becomes reddened/irritated stop using the CHG and inform your nurse when you arrive at Short Stay. Do not shave (including legs and underarms) for at least 48 hours prior to the first CHG shower.  You may shave your face/neck. Please follow these instructions carefully:  1.  Shower with CHG Soap the night before surgery and the  morning of Surgery.  2.  If you choose to wash your hair, wash your hair first as usual with your  normal  shampoo.  3.  After you shampoo, rinse your hair and body thoroughly to remove the  shampoo.  4.  Use CHG as you would any other liquid soap.  You can apply chg directly  to the skin and wash                       Gently with a scrungie or clean washcloth.  5.  Apply the CHG Soap to your body ONLY FROM THE NECK DOWN.   Do not use on face/ open                           Wound or open sores. Avoid contact with eyes, ears mouth and genitals (private parts).                       Wash face,  Genitals (private parts) with your normal soap.             6.  Wash thoroughly, paying special attention to the area where your surgery  will be performed.  7.  Thoroughly rinse your body with warm water from the neck down.  8.  DO NOT shower/wash with your normal soap after using and rinsing off  the CHG Soap.                9.  Pat yourself dry with a clean towel.            10.  Wear clean pajamas.            11.  Place clean sheets on your bed the night of your first shower and do not  sleep with pets. Day of Surgery : Do not apply any lotions/deodorants the morning of surgery.  Please wear clean clothes to the hospital/surgery center.  FAILURE TO FOLLOW THESE INSTRUCTIONS MAY RESULT IN THE  CANCELLATION OF YOUR SURGERY PATIENT SIGNATURE_________________________________  NURSE SIGNATURE__________________________________  ________________________________________________________________________   Adam Phenix  An incentive spirometer is a tool that can help keep your lungs clear and active. This tool measures how well you are filling your lungs with each breath. Taking long deep breaths may help reverse or decrease the chance of developing breathing (pulmonary) problems (especially infection) following:  A long period of time when you are unable to move or be active. BEFORE THE PROCEDURE   If the spirometer includes an indicator to show your best effort, your nurse or respiratory therapist will set it to a desired goal.  If possible, sit up straight or lean slightly forward. Try not to slouch.  Hold the incentive spirometer in an upright position. INSTRUCTIONS FOR USE  1. Sit on the edge of your bed if possible, or sit up as far as you can in bed or on a chair. 2. Hold the incentive spirometer in an upright position. 3. Breathe out normally. 4. Place the mouthpiece in your mouth and seal your lips tightly around it. 5. Breathe in slowly and as deeply as possible, raising the piston or the ball toward the top of the column. 6. Hold your breath for 3-5 seconds or for as long as possible. Allow the piston or ball to fall to the bottom of the column. 7. Remove the mouthpiece from your mouth and breathe out normally. 8. Rest for a few seconds and repeat Steps 1 through 7 at least 10 times every 1-2 hours when you are awake. Take your time and take a few normal breaths between deep breaths. 9. The spirometer may include an indicator to show  your best effort. Use the indicator as a goal to work toward during each repetition. 10. After each set of 10 deep breaths, practice coughing to be sure your lungs are clear. If you have an incision (the cut made at the time of surgery),  support your incision when coughing by placing a pillow or rolled up towels firmly against it. Once you are able to get out of bed, walk around indoors and cough well. You may stop using the incentive spirometer when instructed by your caregiver.  RISKS AND COMPLICATIONS  Take your time so you do not get dizzy or light-headed.  If you are in pain, you may need to take or ask for pain medication before doing incentive spirometry. It is harder to take a deep breath if you are having pain. AFTER USE  Rest and breathe slowly and easily.  It can be helpful to keep track of a log of your progress. Your caregiver can provide you with a simple table to help with this. If you are using the spirometer at home, follow these instructions: [place] IF:   You are having difficultly using the spirometer.  You have trouble using the spirometer as often as instructed.  Your pain medication is not giving enough relief while using the spirometer.  You develop fever of 100.5 F (38.1 C) or higher. SEEK IMMEDIATE MEDICAL CARE IF:   You cough up bloody sputum that had not been present before.  You develop fever of 102 F (38.9 C) or greater.  You develop worsening pain at or near the incision site. MAKE SURE YOU:   Understand these instructions.  Will watch your condition.  Will get help right away if you are not doing well or get worse. Document Released: 04/17/2007 Document Revised: 02/27/2012 Document Reviewed: 06/18/2007 Exit[employer] Patient Information 2014 [employer], [place].   ________________________________________________________________________  WHAT IS A BLOOD TRANSFUSION? Blood Transfusion Information  A transfusion is the replacement of blood or some of its parts. Blood is made up of multiple cells which provide different functions.  Red blood cells carry oxygen and are used for blood loss replacement.  White blood cells fight against infection.  Platelets control  bleeding.  Plasma helps clot blood.  Other blood products are available for specialized needs, such as hemophilia or other clotting disorders. BEFORE THE TRANSFUSION  Who gives blood for transfusions?   Healthy volunteers who are fully evaluated to make sure their blood is safe. This is blood bank blood. Transfusion therapy is the safest it has ever been in the practice of medicine. Before blood is taken from a donor, a complete history is taken to make sure that person has no history of diseases nor engages in risky social behavior (examples are intravenous drug use or sexual activity with multiple partners). The donor's travel history is screened to minimize risk of transmitting infections, such as malaria. The donated blood is tested for signs of infectious diseases, such as HIV and hepatitis. The blood is then tested to be sure it is compatible with you in order to minimize the chance of a transfusion reaction. If you or a relative donates blood, this is often done in anticipation of surgery and is not appropriate for emergency situations. It takes many days to process the donated blood. RISKS AND COMPLICATIONS Although transfusion therapy is very safe and saves many lives, the main dangers of transfusion include:   Getting an infectious disease.  Developing a transfusion reaction. This is an allergic reaction to  something in the blood you were given. Every precaution is taken to prevent this. The decision to have a blood transfusion has been considered carefully by your caregiver before blood is given. Blood is not given unless the benefits outweigh the risks. AFTER THE TRANSFUSION  Right after receiving a blood transfusion, you will usually feel much better and more energetic. This is especially true if your red blood cells have gotten low (anemic). The transfusion raises the level of the red blood cells which carry oxygen, and this usually causes an energy increase.  The nurse  administering the transfusion will monitor you carefully for complications. HOME CARE INSTRUCTIONS  No special instructions are needed after a transfusion. You may find your energy is better. Speak with your caregiver about any limitations on activity for underlying diseases you may have. SEEK MEDICAL CARE IF:   Your condition is not improving after your transfusion.  You develop redness or irritation at the intravenous (IV) site. SEEK IMMEDIATE MEDICAL CARE IF:  Any of the following symptoms occur over the next 12 hours:  Shaking chills.  You have a temperature by mouth above 102 F (38.9 C), not controlled by medicine.  Chest, back, or muscle pain.  People around you feel you are not acting correctly or are confused.  Shortness of breath or difficulty breathing.  Dizziness and fainting.  You get a rash or develop hives.  You have a decrease in urine output.  Your urine turns a dark color or changes to pink, red, or brown. Any of the following symptoms occur over the next 10 days:  You have a temperature by mouth above 102 F (38.9 C), not controlled by medicine.  Shortness of breath.  Weakness after normal activity.  The white part of the eye turns yellow (jaundice).  You have a decrease in the amount of urine or are urinating less often.  Your urine turns a dark color or changes to pink, red, or brown. Document Released: 12/02/2000 Document Revised: 02/27/2012 Document Reviewed: 07/21/2008 [facility] Patient Information 2014 [place], [place].  _______________________________________________________________________

## 2019-02-05 NOTE — Progress Notes (Signed)
Cardiac clearance Nada Boozer PA-C on chart/ epic

## 2019-02-06 NOTE — H&P (Signed)
UNICOMPARTMENTAL KNEE ADMISSION H&P  Patient is being admitted for right medial unicompartmental knee arthroplasty.  Subjective:  Chief Complaint:  Right knee medial compartmental primary OA /pain    HPI: Rhonda Norris, 68 y.o. female, has a history of pain and functional disability in the right and has failed non-surgical conservative treatments for greater than 12 weeks to include NSAID's and/or analgesics, corticosteriod injections, viscosupplementation injections and activity modification.  Onset of symptoms was gradual, starting 1+ years ago with gradually worsening course since that time. The patient noted prior procedures on the knee to include  arthroscopy and menisectomy on the right knee.  Patient currently rates pain in the right knee at 10 out of 10 with activity. Patient has night pain, worsening of pain with activity and weight bearing, pain that interferes with activities of daily living, pain with passive range of motion, crepitus and joint swelling.  Patient has evidence of periarticular osteophytes and joint space narrowing of the medial compartment by imaging studies.  There is no active infection.  Risks, benefits and expectations were discussed with the patient.  Risks including but not limited to the risk of anesthesia, blood clots, nerve damage, blood vessel damage, failure of the prosthesis, infection and up to and including death.  Patient understand the risks, benefits and expectations and wishes to proceed with surgery.   PCP: Catha Gosselin, MD  D/C Plans:       Home   Post-op Meds:       No Rx given   Tranexamic Acid:      To be given - IV   Decadron:      Is to be given  FYI:     Plavix  Norco  DME:   Rx given for - RW   PT:   OPPT    Past Medical History:  Diagnosis Date  . Allergic rhinitis   . Arthritis   . Carpal tunnel syndrome   . Diabetes mellitus without complication (HCC) 02/08/2017   not on medication  . Elevated  cholesterol   . Fibromyalgia   . GERD (gastroesophageal reflux disease)   . IBD (inflammatory bowel disease)   . Osteopenia 03/2018   T score -2.1 FRAX 3.7% / 0.5%  . Pancreatitis, gallstone   . Stroke [facility])      Past Surgical History:  Procedure Laterality Date  . ANTERIOR CERVICAL DECOMP/DISCECTOMY FUSION N/A 02/15/2017   Procedure: ACDF C4-5, removal of hardware and exploration of fusion C5-6;  Surgeon: Venita Lick, MD;  Location: [facility] OR;  Service: Orthopedics;  Laterality: N/A;  . APPENDECTOMY    . 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 CHOLECYSTECTOMY N/A 08/17/2015   Procedure: LAPAROSCOPIC CHOLECYSTECTOMY WITH INTRAOPERATIVE CHOLANGIOGRAM;  Surgeon: Abigail Miyamoto, MD;  Location: [facility] OR;  Service: General;  Laterality: N/A;  . COLPOSCOPY    . HAND SURGERY     bilateral  . KNEE ARTHROSCOPY     rt  . NECK SURGERY    . PELVIC LAPAROSCOPY  1992/1996   endometriosis  . TUBAL LIGATION    . vaginal hysterectomy bilateral salpingo-oophorectomy  11/1996   TVH BSO endometriosis/leiomyomata    Allergies  Allergen Reactions  . Dexilant [Dexlansoprazole] Hives  . Sulfa Antibiotics Other (See Comments)    Headaches   . Aspirin   . Caffeine   . Sulfanilamide   . Methocarbamol     Not effective    Social History  Tobacco Use  . Smoking status: Never Smoker  . Smokeless tobacco: Never Used  Substance Use Topics  . Alcohol use: No    Alcohol/week: 0.0 standard drinks  . Drug use: No    Family History  Problem Relation Age of Onset  . Heart disease Mother   . Seizures Mother   . Heart disease Brother      Review of Systems  Constitutional: Negative.   HENT: Negative.   Eyes: Negative.   Respiratory: Negative.   Cardiovascular: Negative.   Gastrointestinal: Positive for heartburn.  Genitourinary: Negative.   Musculoskeletal: Positive for joint pain.  Skin: Negative.   Neurological: Negative.   Endo/Heme/Allergies: Positive for  environmental allergies.  Psychiatric/Behavioral: Negative.      Objective:   Physical Exam  Constitutional: She is oriented to person, place, and time. She appears well-developed.  HENT:  Head: Normocephalic.  Eyes: Pupils are equal, round, and reactive to light.  Neck: Neck supple. No JVD present. No tracheal deviation present. No thyromegaly present.  Cardiovascular: Normal rate, regular rhythm and intact distal pulses.  Respiratory: Effort normal and breath sounds normal. No respiratory distress. She has no wheezes.  GI: Soft. There is no abdominal tenderness. There is no guarding.  Musculoskeletal:     Right knee: She exhibits decreased range of motion, swelling and bony tenderness. She exhibits no ecchymosis, no deformity, no laceration and no erythema. Tenderness found.  Lymphadenopathy:    She has no cervical adenopathy.  Neurological: She is alert and oriented to person, place, and time.  Skin: Skin is warm and dry.  Psychiatric: She has a normal mood and affect.       Labs:  Estimated body mass index is 29.89 kg/m as calculated from the following:   Height as of 09/11/18: 5\' 4"  (1.626 m).   Weight as of 09/11/18: 79 kg.   Imaging Review Plain radiographs demonstrate severe degenerative joint disease of the right knee medial compartment.  The bone quality appears to be good for age and reported activity level.  Assessment/Plan:  End stage arthritis, right knee medial compartment  The patient history, physical examination, clinical judgment of the provider and imaging studies are consistent with end stage degenerative joint disease of the right knee and medial unicompartmental knee arthroplasty is deemed medically necessary. The treatment options including medical management, injection therapy arthroscopy and arthroplasty were discussed at length. The risks and benefits of total knee arthroplasty were presented and reviewed. The risks due to aseptic loosening,  infection, stiffness, patella tracking problems, thromboembolic complications and other imponderables were discussed. The patient acknowledged the explanation, agreed to proceed with the plan and consent was signed. Patient is being admitted for outpatient / observation treatment for surgery, pain control, PT, OT, prophylactic antibiotics, VTE prophylaxis, progressive ambulation and ADL's and discharge planning. The patient is planning to be discharged home.     Anastasio Auerbach Patton Swisher   PA-C  02/06/2019, 4:33 PM

## 2019-02-07 ENCOUNTER — Encounter (INDEPENDENT_AMBULATORY_CARE_PROVIDER_SITE_OTHER): Payer: Self-pay

## 2019-02-07 ENCOUNTER — Other Ambulatory Visit: Payer: Self-pay

## 2019-02-07 ENCOUNTER — Encounter (HOSPITAL_COMMUNITY)
Admission: RE | Admit: 2019-02-07 | Discharge: 2019-02-07 | Disposition: A | Discharge status: Home / Self Care | Payer: Medicare Other | Source: Ambulatory Visit | Attending: Orthopedic Surgery | Admitting: Orthopedic Surgery

## 2019-02-07 ENCOUNTER — Encounter (HOSPITAL_COMMUNITY): Payer: Self-pay

## 2019-02-07 DIAGNOSIS — Z01818 Encounter for other preprocedural examination: Secondary | ICD-10-CM | POA: Insufficient documentation

## 2019-02-07 DIAGNOSIS — E119 Type 2 diabetes mellitus without complications: Secondary | ICD-10-CM | POA: Insufficient documentation

## 2019-02-07 DIAGNOSIS — M1711 Unilateral primary osteoarthritis, right knee: Secondary | ICD-10-CM | POA: Diagnosis not present

## 2019-02-07 HISTORY — DX: Essential (primary) hypertension: I10

## 2019-02-07 HISTORY — DX: Nausea with vomiting, unspecified: R11.2

## 2019-02-07 HISTORY — DX: Other specified postprocedural states: Z98.890

## 2019-02-07 LAB — CBC
HEMATOCRIT: 36.7 % (ref 36.0–46.0)
Hemoglobin: 11.5 g/dL — ABNORMAL LOW (ref 12.0–15.0)
MCH: 28.8 pg (ref 26.0–34.0)
MCHC: 31.3 g/dL (ref 30.0–36.0)
MCV: 91.8 fL (ref 80.0–100.0)
Platelets: 357 10*3/uL (ref 150–400)
RBC: 4 MIL/uL (ref 3.87–5.11)
RDW: 13.2 % (ref 11.5–15.5)
WBC: 6 10*3/uL (ref 4.0–10.5)
nRBC: 0 % (ref 0.0–0.2)

## 2019-02-07 LAB — SURGICAL PCR SCREEN
MRSA, PCR: NEGATIVE
STAPHYLOCOCCUS AUREUS: NEGATIVE

## 2019-02-07 LAB — BASIC METABOLIC PANEL
Anion gap: 8 (ref 5–15)
BUN: 14 mg/dL (ref 8–23)
CO2: 28 mmol/L (ref 22–32)
Calcium: 9.6 mg/dL (ref 8.9–10.3)
Chloride: 103 mmol/L (ref 98–111)
Creatinine, Ser: 0.72 mg/dL (ref 0.44–1.00)
GFR calc Af Amer: >60 mL/min (ref >60)
GFR calc non Af Amer: >60 mL/min (ref >60)
GLUCOSE: 91 mg/dL (ref 70–99)
Potassium: 3.9 mmol/L (ref 3.5–5.1)
Sodium: 139 mmol/L (ref 135–145)

## 2019-02-07 LAB — ABO/RH: ABO/RH(D): O POS

## 2019-02-07 LAB — GLUCOSE, CAPILLARY: Glucose-Capillary: 80 mg/dL (ref 70–99)

## 2019-02-07 NOTE — Progress Notes (Signed)
ekg from [facility] on chart 02-01-2019

## 2019-02-08 ENCOUNTER — Other Ambulatory Visit (HOSPITAL_COMMUNITY): Payer: Self-pay | Admitting: Emergency Medicine

## 2019-02-08 LAB — HEMOGLOBIN A1C
Hgb A1c MFr Bld: 6 % — ABNORMAL HIGH (ref 4.8–5.6)
Mean Plasma Glucose: 126 mg/dL

## 2019-02-12 NOTE — Progress Notes (Signed)
Anesthesia chart review:    Case:  [id number] Date/Time:  02/14/19 1115   Procedure:  UNICOMPARTMENTAL KNEE (Right )   Anesthesia type:  Spinal   Pre-op diagnosis:  Right knee medial compartmental osteoarthritis   Location:  [facility] ROOM 10 / [facility]S   Surgeon:  Durene Romans, MD      DISCUSSION:  Pt is a 68 year old female with hx HTN, diabetes, TIA  Pt has cardiac clearance for surgery from Nada Boozer, NP  Pt takes plavix for hx TIA; last dose 02/06/19   VS: BP 140/90   Pulse 70   Temp 36.7 C (Oral)   Resp 18   Ht 5\' 3"  (1.6 m)   Wt 64.9 kg   SpO2 100%   BMI 25.33 kg/m    PROVIDERS: - PCP is Catha Gosselin, MD - Cardiologist is Dietrich Pates, MD who sees pt for HTN.  Last cardiology office visit 09/11/18. Pt cleared for surgery by Nada Boozer, NP   LABS: Labs reviewed: Acceptable for surgery. (all labs ordered are listed, but only abnormal results are displayed)  Labs Reviewed  HEMOGLOBIN A1C - Abnormal; Notable for the following components:      Result Value   Hgb A1c MFr Bld 6.0 (*)    All other components within normal limits  CBC - Abnormal; Notable for the following components:   Hemoglobin 11.5 (*)    All other components within normal limits  SURGICAL PCR SCREEN  GLUCOSE, CAPILLARY  BASIC METABOLIC PANEL  TYPE AND SCREEN  ABO/RH     IMAGES:   EKG 02/01/19 (PCP's office): Sinus rhythm. Nonspecific T abnormality.    CV:  Carotid duplex 06/19/16:  - Bilateral: intimal wall thickening CCA.  - Right: Mild mixed plaque origin ICA.  - Left: mild to moderate mixed plaque with acoustic shadowing origin ICA.  - Bilateral: 1-39% ICA plaquing.  - Vertebral artery flow is antegrade.  Echo 06/19/16:  - Left ventricle: The cavity size was normal. There was mild focal basal hypertrophy of the septum. Systolic function was normal. The estimated ejection fraction was in the range of 60% to 65%. Wall motion was normal; there were no regional wall motion abnormalities.  Doppler parameters are consistent with abnormal left ventricular relaxation (grade 1 diastolic dysfunction). - Aortic valve: There was mild regurgitation. - Mitral valve: Calcified annulus. - Impressions: Definity used; normal LV systolic function; grade 1 diastolic dysfunction; sclerotic aortic valve with mild AI.   Past Medical History:  Diagnosis Date  . Allergic rhinitis   . Arthritis   . Carpal tunnel syndrome   . Diabetes mellitus without complication (HCC) 02/08/2017   ON METFORMIN BID   . Elevated cholesterol   . Fibromyalgia   . GERD (gastroesophageal reflux disease)   . HTN (hypertension)   . IBD (inflammatory bowel disease)   . Osteopenia 03/2018   T score -2.1 FRAX 3.7% / 0.5%  . Pancreatitis, gallstone   . PONV (postoperative nausea and vomiting)    NAUSEA AFTER KNEE ARTHROSCOPY   . Stroke [facility])     Past Surgical History:  Procedure Laterality Date  . ANTERIOR CERVICAL DECOMP/DISCECTOMY FUSION N/A 02/15/2017   Procedure: ACDF C4-5, removal of hardware and exploration of fusion C5-6;  Surgeon: Venita Lick, MD;  Location: [facility] OR;  Service: Orthopedics;  Laterality: N/A;  . APPENDECTOMY    . CATARACT EXTRACTION, BILATERAL  JANUARY AND FEBRUARY  . 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  CHOLECYSTECTOMY N/A 08/17/2015   Procedure: LAPAROSCOPIC CHOLECYSTECTOMY WITH INTRAOPERATIVE CHOLANGIOGRAM;  Surgeon: Abigail Miyamoto, MD;  Location: [facility] OR;  Service: General;  Laterality: N/A;  . COLPOSCOPY    . HAND SURGERY     bilateral  . KNEE ARTHROSCOPY     rt  . NECK SURGERY    . PELVIC LAPAROSCOPY  1992/1996   endometriosis  . TUBAL LIGATION    . vaginal hysterectomy bilateral salpingo-oophorectomy  11/1996   TVH BSO endometriosis/leiomyomata    MEDICATIONS: . acidophilus (RISAQUAD) CAPS capsule  . amLODipine (NORVASC) 5 MG tablet  . Ascorbic Acid (VITAMIN C) 500 MG CAPS  . BIOTIN PO  . Calcium Carbonate-Vitamin D (CALCIUM 600+D) 600-200  MG-UNIT TABS  . CINNAMON PO  . clopidogrel (PLAVIX) 75 MG tablet  . Coenzyme Q10 (COQ10) 400 MG CAPS  . Cranberry 250 MG CAPS  . diphenhydrAMINE (BENADRYL) 25 MG tablet  . Estradiol (ESTRACE PO)  . Ginger, Zingiber officinalis, (GINGER ROOT PO)  . Ginkgo Biloba 120 MG CAPS  . Glucosamine HCl 1500 MG TABS  . loratadine (CLARITIN) 10 MG tablet  . Melatonin 3 MG TABS  . metFORMIN (GLUCOPHAGE) 500 MG tablet  . mometasone (NASONEX) 50 MCG/ACT nasal spray  . Multiple Vitamin (MULTIVITAMIN WITH MINERALS) TABS tablet  . Multiple Vitamins-Minerals (HAIR SKIN & NAILS ADVANCED) TABS  . naproxen (NAPROSYN) 500 MG tablet  . naproxen sodium (ALEVE) 220 MG tablet  . nortriptyline (PAMELOR) 25 MG capsule  . Omega-3 Fatty Acids (FISH OIL) 1000 MG CAPS  . polyethylene glycol (MIRALAX / GLYCOLAX) packet  . pravastatin (PRAVACHOL) 40 MG tablet  . TURMERIC PO  . VITAMIN D PO  . vitamin E 100 UNIT capsule   No current facility-administered medications for this encounter.    - Last dose plavix 02/06/19   If no changes, I anticipate pt can proceed with surgery as scheduled.   Rica Mast, FNP-BC [facility] Short Stay Surgical Center/Anesthesiology Phone: [phone] 02/12/2019 2:36 PM

## 2019-02-14 ENCOUNTER — Encounter (HOSPITAL_COMMUNITY): Payer: Self-pay | Admitting: Anesthesiology

## 2019-02-14 ENCOUNTER — Ambulatory Visit (HOSPITAL_COMMUNITY): Payer: Medicare Other | Admitting: Emergency Medicine

## 2019-02-14 ENCOUNTER — Other Ambulatory Visit: Payer: Self-pay

## 2019-02-14 ENCOUNTER — Encounter (HOSPITAL_COMMUNITY)
Admission: RE | Disposition: A | Discharge status: Home / Self Care | Payer: Self-pay | Source: Home / Self Care | Attending: Orthopedic Surgery

## 2019-02-14 ENCOUNTER — Observation Stay (HOSPITAL_COMMUNITY)
Admission: RE | Admit: 2019-02-14 | Discharge: 2019-02-15 | Disposition: A | Discharge status: Home / Self Care | Payer: Medicare Other | Attending: Orthopedic Surgery | Admitting: Orthopedic Surgery

## 2019-02-14 ENCOUNTER — Ambulatory Visit (HOSPITAL_COMMUNITY): Payer: Medicare Other | Admitting: Anesthesiology

## 2019-02-14 DIAGNOSIS — Z90710 Acquired absence of both cervix and uterus: Secondary | ICD-10-CM | POA: Insufficient documentation

## 2019-02-14 DIAGNOSIS — M1711 Unilateral primary osteoarthritis, right knee: Secondary | ICD-10-CM | POA: Diagnosis present

## 2019-02-14 DIAGNOSIS — Z886 Allergy status to analgesic agent status: Secondary | ICD-10-CM | POA: Insufficient documentation

## 2019-02-14 DIAGNOSIS — Z9049 Acquired absence of other specified parts of digestive tract: Secondary | ICD-10-CM | POA: Insufficient documentation

## 2019-02-14 DIAGNOSIS — Z888 Allergy status to other drugs, medicaments and biological substances status: Secondary | ICD-10-CM | POA: Insufficient documentation

## 2019-02-14 DIAGNOSIS — M797 Fibromyalgia: Secondary | ICD-10-CM | POA: Insufficient documentation

## 2019-02-14 DIAGNOSIS — Z882 Allergy status to sulfonamides status: Secondary | ICD-10-CM | POA: Insufficient documentation

## 2019-02-14 DIAGNOSIS — K589 Irritable bowel syndrome without diarrhea: Secondary | ICD-10-CM | POA: Diagnosis not present

## 2019-02-14 DIAGNOSIS — Z8249 Family history of ischemic heart disease and other diseases of the circulatory system: Secondary | ICD-10-CM | POA: Diagnosis not present

## 2019-02-14 DIAGNOSIS — I350 Nonrheumatic aortic (valve) stenosis: Secondary | ICD-10-CM | POA: Insufficient documentation

## 2019-02-14 DIAGNOSIS — E7800 Pure hypercholesterolemia, unspecified: Secondary | ICD-10-CM | POA: Diagnosis not present

## 2019-02-14 DIAGNOSIS — Z8673 Personal history of transient ischemic attack (TIA), and cerebral infarction without residual deficits: Secondary | ICD-10-CM | POA: Diagnosis not present

## 2019-02-14 DIAGNOSIS — K219 Gastro-esophageal reflux disease without esophagitis: Secondary | ICD-10-CM | POA: Diagnosis not present

## 2019-02-14 DIAGNOSIS — Z820 Family history of epilepsy and other diseases of the nervous system: Secondary | ICD-10-CM | POA: Diagnosis not present

## 2019-02-14 DIAGNOSIS — E119 Type 2 diabetes mellitus without complications: Secondary | ICD-10-CM | POA: Insufficient documentation

## 2019-02-14 DIAGNOSIS — Z96651 Presence of right artificial knee joint: Secondary | ICD-10-CM

## 2019-02-14 DIAGNOSIS — Z981 Arthrodesis status: Secondary | ICD-10-CM | POA: Insufficient documentation

## 2019-02-14 DIAGNOSIS — M8580 Other specified disorders of bone density and structure, unspecified site: Secondary | ICD-10-CM | POA: Diagnosis not present

## 2019-02-14 HISTORY — PX: PARTIAL KNEE ARTHROPLASTY: SHX2174

## 2019-02-14 LAB — GLUCOSE, CAPILLARY
Glucose-Capillary: 97 mg/dL (ref 70–99)
Glucose-Capillary: 62 mg/dL — ABNORMAL LOW (ref 70–99)
Glucose-Capillary: 70 mg/dL (ref 70–99)

## 2019-02-14 LAB — TYPE AND SCREEN
ABO/RH(D): O POS
Antibody Screen: NEGATIVE

## 2019-02-14 LAB — PROTIME-INR
INR: 0.8 (ref 0.8–1.2)
Prothrombin Time: 10.8 seconds — ABNORMAL LOW (ref 11.4–15.2)

## 2019-02-14 SURGERY — ARTHROPLASTY, KNEE, UNICOMPARTMENTAL
Anesthesia: Spinal | Laterality: Right

## 2019-02-14 MED ORDER — PROPOFOL 500 MG/50ML IV EMUL
INTRAVENOUS | Status: DC | PRN
  Administered 2019-02-14: 75 ug/kg/min via INTRAVENOUS

## 2019-02-14 MED ORDER — MAGNESIUM CITRATE PO SOLN: 1.0000 | Freq: Once | ORAL | Status: DC | PRN

## 2019-02-14 MED ORDER — CEFAZOLIN SODIUM-DEXTROSE 2-4 GM/100ML-% IV SOLN
2.0000 g | INTRAVENOUS | Status: AC
Start: 2019-02-14 — End: 2019-02-14
  Administered 2019-02-14: 2 g via INTRAVENOUS
  Filled 2019-02-14: qty 100

## 2019-02-14 MED ORDER — NORTRIPTYLINE HCL 25 MG PO CAPS
25.0000 mg | ORAL_CAPSULE | Freq: Every day | ORAL | Status: DC
  Administered 2019-02-14: 25 mg via ORAL
  Filled 2019-02-14: qty 1

## 2019-02-14 MED ORDER — TRANEXAMIC ACID-NACL 1000-0.7 MG/100ML-% IV SOLN
1000.0000 mg | Freq: Once | INTRAVENOUS | Status: AC
  Administered 2019-02-14: 1000 mg via INTRAVENOUS
  Filled 2019-02-14: qty 100

## 2019-02-14 MED ORDER — DEXAMETHASONE SODIUM PHOSPHATE 10 MG/ML IJ SOLN
10.0000 mg | Freq: Once | INTRAMUSCULAR | Status: AC
  Administered 2019-02-14: 8 mg via INTRAVENOUS

## 2019-02-14 MED ORDER — METOCLOPRAMIDE HCL 5 MG PO TABS: 5.0000 mg | ORAL_TABLET | Freq: Three times a day (TID) | ORAL | Status: DC | PRN

## 2019-02-14 MED ORDER — FENTANYL CITRATE (PF) 100 MCG/2ML IJ SOLN: 25.0000 ug | INTRAMUSCULAR | Status: DC | PRN

## 2019-02-14 MED ORDER — MENTHOL 3 MG MT LOZG: 1.0000 | LOZENGE | OROMUCOSAL | Status: DC | PRN

## 2019-02-14 MED ORDER — PHENOL 1.4 % MT LIQD: 1.0000 | OROMUCOSAL | Status: DC | PRN

## 2019-02-14 MED ORDER — OXYCODONE HCL 5 MG/5ML PO SOLN: 5.0000 mg | Freq: Once | ORAL | Status: DC | PRN

## 2019-02-14 MED ORDER — ROPIVACAINE HCL 5 MG/ML IJ SOLN
INTRAMUSCULAR | Status: DC | PRN
  Administered 2019-02-14: 20 mL via PERINEURAL

## 2019-02-14 MED ORDER — VITAMIN D 25 MCG (1000 UNIT) PO TABS
1000.0000 [IU] | ORAL_TABLET | Freq: Every day | ORAL | Status: DC
  Administered 2019-02-14 – 2019-02-15 (×2): 1000 [IU] via ORAL
  Filled 2019-02-14 (×2): qty 1

## 2019-02-14 MED ORDER — BUPIVACAINE IN DEXTROSE 0.75-8.25 % IT SOLN
INTRATHECAL | Status: DC | PRN
  Administered 2019-02-14: 1.6 mL via INTRATHECAL

## 2019-02-14 MED ORDER — CEFAZOLIN SODIUM-DEXTROSE 2-4 GM/100ML-% IV SOLN
2.0000 g | Freq: Four times a day (QID) | INTRAVENOUS | Status: AC
  Administered 2019-02-14 (×2): 2 g via INTRAVENOUS
  Filled 2019-02-14 (×2): qty 100

## 2019-02-14 MED ORDER — ONDANSETRON HCL 4 MG/2ML IJ SOLN
INTRAMUSCULAR | Status: DC | PRN
  Administered 2019-02-14: 4 mg via INTRAVENOUS

## 2019-02-14 MED ORDER — METOCLOPRAMIDE HCL 5 MG/ML IJ SOLN: 5.0000 mg | Freq: Three times a day (TID) | INTRAMUSCULAR | Status: DC | PRN

## 2019-02-14 MED ORDER — LIP MEDEX EX OINT
TOPICAL_OINTMENT | CUTANEOUS | Status: AC
  Administered 2019-02-14: 1
  Filled 2019-02-14: qty 7

## 2019-02-14 MED ORDER — PROPOFOL 10 MG/ML IV BOLUS
INTRAVENOUS | Status: AC
  Filled 2019-02-14: qty 60

## 2019-02-14 MED ORDER — OXYCODONE HCL 5 MG PO TABS: 5.0000 mg | ORAL_TABLET | Freq: Once | ORAL | Status: DC | PRN

## 2019-02-14 MED ORDER — PHENYLEPHRINE 40 MCG/ML (10ML) SYRINGE FOR IV PUSH (FOR BLOOD PRESSURE SUPPORT)
PREFILLED_SYRINGE | INTRAVENOUS | Status: AC
  Filled 2019-02-14: qty 10

## 2019-02-14 MED ORDER — SODIUM CHLORIDE (PF) 0.9 % IJ SOLN
INTRAMUSCULAR | Status: AC
  Filled 2019-02-14: qty 50

## 2019-02-14 MED ORDER — FENTANYL CITRATE (PF) 100 MCG/2ML IJ SOLN
50.0000 ug | INTRAMUSCULAR | Status: DC
  Administered 2019-02-14: 50 ug via INTRAVENOUS
  Filled 2019-02-14: qty 2

## 2019-02-14 MED ORDER — POLYETHYLENE GLYCOL 3350 17 G PO PACK
17.0000 g | PACK | Freq: Two times a day (BID) | ORAL | Status: DC
  Administered 2019-02-14 – 2019-02-15 (×2): 17 g via ORAL
  Filled 2019-02-14 (×2): qty 1

## 2019-02-14 MED ORDER — LORATADINE 10 MG PO TABS
10.0000 mg | ORAL_TABLET | Freq: Every day | ORAL | Status: DC
  Administered 2019-02-14 – 2019-02-15 (×2): 10 mg via ORAL
  Filled 2019-02-14 (×2): qty 1

## 2019-02-14 MED ORDER — CALCIUM CARBONATE-VITAMIN D 500-200 MG-UNIT PO TABS
1.0000 | ORAL_TABLET | Freq: Every day | ORAL | Status: DC
  Administered 2019-02-14 – 2019-02-15 (×2): 1 via ORAL
  Filled 2019-02-14 (×2): qty 1

## 2019-02-14 MED ORDER — DOCUSATE SODIUM 100 MG PO CAPS
100.0000 mg | ORAL_CAPSULE | Freq: Two times a day (BID) | ORAL | Status: DC
  Administered 2019-02-14 – 2019-02-15 (×2): 100 mg via ORAL
  Filled 2019-02-14 (×2): qty 1

## 2019-02-14 MED ORDER — TRANEXAMIC ACID-NACL 1000-0.7 MG/100ML-% IV SOLN
1000.0000 mg | INTRAVENOUS | Status: AC
  Administered 2019-02-14: 1000 mg via INTRAVENOUS
  Filled 2019-02-14: qty 100

## 2019-02-14 MED ORDER — KETOROLAC TROMETHAMINE 30 MG/ML IJ SOLN
INTRAMUSCULAR | Status: DC | PRN
  Administered 2019-02-14: 30 mg via INTRAVENOUS

## 2019-02-14 MED ORDER — ACETAMINOPHEN 500 MG PO TABS
500.0000 mg | ORAL_TABLET | Freq: Four times a day (QID) | ORAL | Status: DC
  Administered 2019-02-14 – 2019-02-15 (×2): 500 mg via ORAL
  Filled 2019-02-14 (×2): qty 1

## 2019-02-14 MED ORDER — ACETAMINOPHEN 325 MG PO TABS: 325.0000 mg | ORAL_TABLET | Freq: Four times a day (QID) | ORAL | Status: DC | PRN

## 2019-02-14 MED ORDER — MELATONIN 3 MG PO TABS
3.0000 mg | ORAL_TABLET | Freq: Every evening | ORAL | Status: DC | PRN
  Administered 2019-02-14: 3 mg via ORAL
  Filled 2019-02-14 (×3): qty 1

## 2019-02-14 MED ORDER — ONDANSETRON HCL 4 MG/2ML IJ SOLN: 4.0000 mg | Freq: Once | INTRAMUSCULAR | Status: DC | PRN

## 2019-02-14 MED ORDER — SODIUM CHLORIDE (PF) 0.9 % IJ SOLN
INTRAMUSCULAR | Status: DC | PRN
  Administered 2019-02-14: 50 mL via INTRAVENOUS

## 2019-02-14 MED ORDER — HYDROCODONE-ACETAMINOPHEN 5-325 MG PO TABS
1.0000 | ORAL_TABLET | ORAL | Status: DC | PRN
  Administered 2019-02-14 – 2019-02-15 (×4): 1 via ORAL
  Filled 2019-02-14 (×4): qty 1

## 2019-02-14 MED ORDER — METFORMIN HCL 500 MG PO TABS
500.0000 mg | ORAL_TABLET | Freq: Two times a day (BID) | ORAL | Status: DC
  Administered 2019-02-14 – 2019-02-15 (×2): 500 mg via ORAL
  Filled 2019-02-14 (×2): qty 1

## 2019-02-14 MED ORDER — DIPHENHYDRAMINE HCL 12.5 MG/5ML PO ELIX: 12.5000 mg | ORAL_SOLUTION | ORAL | Status: DC | PRN

## 2019-02-14 MED ORDER — FERROUS SULFATE 325 (65 FE) MG PO TABS
325.0000 mg | ORAL_TABLET | Freq: Two times a day (BID) | ORAL | Status: DC
  Administered 2019-02-15: 325 mg via ORAL
  Filled 2019-02-14: qty 1

## 2019-02-14 MED ORDER — MORPHINE SULFATE (PF) 2 MG/ML IV SOLN: 0.5000 mg | INTRAVENOUS | Status: DC | PRN

## 2019-02-14 MED ORDER — MIDAZOLAM HCL 2 MG/2ML IJ SOLN
1.0000 mg | INTRAMUSCULAR | Status: DC
  Administered 2019-02-14: 2 mg via INTRAVENOUS
  Filled 2019-02-14: qty 2

## 2019-02-14 MED ORDER — CHLORHEXIDINE GLUCONATE 4 % EX LIQD: 60.0000 mL | Freq: Once | CUTANEOUS | Status: DC

## 2019-02-14 MED ORDER — AMLODIPINE BESYLATE 5 MG PO TABS
5.0000 mg | ORAL_TABLET | Freq: Every day | ORAL | Status: DC
  Administered 2019-02-14 – 2019-02-15 (×2): 5 mg via ORAL
  Filled 2019-02-14 (×2): qty 1

## 2019-02-14 MED ORDER — VITAMIN C 500 MG PO TABS: 500.0000 mg | ORAL_TABLET | Freq: Every day | ORAL | Status: DC | PRN

## 2019-02-14 MED ORDER — PHENYLEPHRINE 40 MCG/ML (10ML) SYRINGE FOR IV PUSH (FOR BLOOD PRESSURE SUPPORT)
PREFILLED_SYRINGE | INTRAVENOUS | Status: DC | PRN
  Administered 2019-02-14 (×4): 80 ug via INTRAVENOUS

## 2019-02-14 MED ORDER — LACTATED RINGERS IV SOLN
INTRAVENOUS | Status: DC
  Administered 2019-02-14 (×2): via INTRAVENOUS

## 2019-02-14 MED ORDER — CLOPIDOGREL BISULFATE 75 MG PO TABS
75.0000 mg | ORAL_TABLET | Freq: Every day | ORAL | Status: DC
  Administered 2019-02-14 – 2019-02-15 (×2): 75 mg via ORAL
  Filled 2019-02-14 (×2): qty 1

## 2019-02-14 MED ORDER — HYDROCODONE-ACETAMINOPHEN 7.5-325 MG PO TABS
1.0000 | ORAL_TABLET | ORAL | Status: DC | PRN
  Administered 2019-02-15: 1 via ORAL
  Filled 2019-02-14: qty 1

## 2019-02-14 MED ORDER — KETOROLAC TROMETHAMINE 30 MG/ML IJ SOLN
INTRAMUSCULAR | Status: AC
  Filled 2019-02-14: qty 1

## 2019-02-14 MED ORDER — DEXAMETHASONE SODIUM PHOSPHATE 10 MG/ML IJ SOLN
10.0000 mg | Freq: Once | INTRAMUSCULAR | Status: AC
  Administered 2019-02-15: 10 mg via INTRAVENOUS
  Filled 2019-02-14: qty 1

## 2019-02-14 MED ORDER — ONDANSETRON HCL 4 MG/2ML IJ SOLN: 4.0000 mg | Freq: Four times a day (QID) | INTRAMUSCULAR | Status: DC | PRN

## 2019-02-14 MED ORDER — PROPOFOL 10 MG/ML IV BOLUS
INTRAVENOUS | Status: DC | PRN
  Administered 2019-02-14: 15 mg via INTRAVENOUS

## 2019-02-14 MED ORDER — ONDANSETRON HCL 4 MG PO TABS: 4.0000 mg | ORAL_TABLET | Freq: Four times a day (QID) | ORAL | Status: DC | PRN

## 2019-02-14 MED ORDER — ALUM & MAG HYDROXIDE-SIMETH 200-200-20 MG/5ML PO SUSP: 30.0000 mL | ORAL | Status: DC | PRN

## 2019-02-14 MED ORDER — COQ10 400 MG PO CAPS: 1.0000 | ORAL_CAPSULE | Freq: Every day | ORAL | Status: DC

## 2019-02-14 MED ORDER — BISACODYL 10 MG RE SUPP: 10.0000 mg | Freq: Every day | RECTAL | Status: DC | PRN

## 2019-02-14 MED ORDER — BUPIVACAINE-EPINEPHRINE 0.25% -1:200000 IJ SOLN
INTRAMUSCULAR | Status: DC | PRN
  Administered 2019-02-14: 30 mL

## 2019-02-14 MED ORDER — PRAVASTATIN SODIUM 20 MG PO TABS
40.0000 mg | ORAL_TABLET | Freq: Every evening | ORAL | Status: DC
  Administered 2019-02-14: 40 mg via ORAL
  Filled 2019-02-14: qty 2

## 2019-02-14 MED ORDER — BUPIVACAINE HCL (PF) 0.25 % IJ SOLN
INTRAMUSCULAR | Status: AC
  Filled 2019-02-14: qty 30

## 2019-02-14 MED ORDER — SODIUM CHLORIDE 0.9 % IV SOLN
INTRAVENOUS | Status: DC
  Administered 2019-02-14 – 2019-02-15 (×2): via INTRAVENOUS

## 2019-02-14 SURGICAL SUPPLY — 45 items
BAG ZIPLOCK 12X15 (MISCELLANEOUS) IMPLANT
BANDAGE ACE 6X5 VEL STRL LF (GAUZE/BANDAGES/DRESSINGS) ×3 IMPLANT
BLADE SAW RECIPROCATING 77.5 (BLADE) ×3 IMPLANT
BLADE SAW SGTL 13.0X1.19X90.0M (BLADE) ×3 IMPLANT
BOWL SMART MIX CTS (DISPOSABLE) ×3 IMPLANT
CEMENT BONE R 1X40 (Cement) ×3 IMPLANT
COVER SURGICAL LIGHT HANDLE (MISCELLANEOUS) ×3 IMPLANT
COVER WAND RF STERILE (DRAPES) IMPLANT
CUFF TOURN SGL QUICK 34 (TOURNIQUET CUFF) ×2
CUFF TRNQT CYL 34X4.125X (TOURNIQUET CUFF) ×1 IMPLANT
DECANTER SPIKE VIAL GLASS SM (MISCELLANEOUS) ×6 IMPLANT
DERMABOND ADVANCED (GAUZE/BANDAGES/DRESSINGS) ×2
DERMABOND ADVANCED .7 DNX12 (GAUZE/BANDAGES/DRESSINGS) ×1 IMPLANT
DRAPE U-SHAPE 47X51 STRL (DRAPES) ×3 IMPLANT
DRESSING AQUACEL AG SP 3.5X10 (GAUZE/BANDAGES/DRESSINGS) ×1 IMPLANT
DRSG AQUACEL AG ADV 3.5X10 (GAUZE/BANDAGES/DRESSINGS) ×3 IMPLANT
DRSG AQUACEL AG SP 3.5X10 (GAUZE/BANDAGES/DRESSINGS) ×3
DURAPREP 26ML APPLICATOR (WOUND CARE) ×3 IMPLANT
ELECT REM PT RETURN 15FT ADLT (MISCELLANEOUS) ×3 IMPLANT
GLOVE BIOGEL PI IND STRL 7.5 (GLOVE) ×1 IMPLANT
GLOVE BIOGEL PI IND STRL 8.5 (GLOVE) ×1 IMPLANT
GLOVE BIOGEL PI INDICATOR 7.5 (GLOVE) ×2
GLOVE BIOGEL PI INDICATOR 8.5 (GLOVE) ×2
GLOVE ECLIPSE 8.0 STRL XLNG CF (GLOVE) ×3 IMPLANT
GLOVE ORTHO TXT STRL SZ7.5 (GLOVE) ×6 IMPLANT
GOWN STRL REUS W/TWL 2XL LVL3 (GOWN DISPOSABLE) ×3 IMPLANT
GOWN STRL REUS W/TWL LRG LVL3 (GOWN DISPOSABLE) ×3 IMPLANT
HOLDER FOLEY CATH W/STRAP (MISCELLANEOUS) IMPLANT
KNEE OXFORD UNI MENISCAL (Joint) ×3 IMPLANT
LEGGING LITHOTOMY PAIR STRL (DRAPES) ×3 IMPLANT
MANIFOLD NEPTUNE II (INSTRUMENTS) ×3 IMPLANT
NDL SAFETY ECLIPSE 18X1.5 (NEEDLE) ×1 IMPLANT
NEEDLE HYPO 18GX1.5 SHARP (NEEDLE) ×2
PACK ICE MAXI GEL EZY WRAP (MISCELLANEOUS) ×3 IMPLANT
PACK TOTAL KNEE CUSTOM (KITS) ×3 IMPLANT
PEG FEMORAL PEGGED STRL SM (Knees) ×3 IMPLANT
SUT MNCRL AB 4-0 PS2 18 (SUTURE) ×3 IMPLANT
SUT STRATAFIX PDS+ 0 24IN (SUTURE) ×3 IMPLANT
SUT VIC AB 1 CT1 36 (SUTURE) ×3 IMPLANT
SUT VIC AB 2-0 CT1 27 (SUTURE) ×4
SUT VIC AB 2-0 CT1 TAPERPNT 27 (SUTURE) ×2 IMPLANT
SYR 3ML LL SCALE MARK (SYRINGE) ×3 IMPLANT
TRAY FOLEY MTR SLVR 16FR STAT (SET/KITS/TRAYS/PACK) ×3 IMPLANT
TRAY TIBIAL KNEE OXFORD SZAA (Joint) ×3 IMPLANT
YANKAUER SUCT BULB TIP NO VENT (SUCTIONS) ×3 IMPLANT

## 2019-02-14 NOTE — Progress Notes (Signed)
PHARMACIST - PHYSICIAN ORDER COMMUNICATION  CONCERNING: P&T Medication Policy on Herbal Medications  DESCRIPTION:  This patient's order for: Coenzyme Q10 has been noted.  This product(s) is classified as an "herbal" or natural product. Due to a lack of definitive safety studies or FDA approval, nonstandard manufacturing practices, plus the potential risk of unknown drug-drug interactions while on inpatient medications, the Pharmacy and Therapeutics Committee does not permit the use of "herbal" or natural products of this type within [facility].   ACTION TAKEN: The pharmacy department is unable to verify this order at this time and your patient has been informed of this safety policy. Please reevaluate patient's clinical condition at discharge and address if the herbal or natural product(s) should be resumed at that time.   Greer Pickerel, PharmD, BCPS Pager: [phone] 02/14/2019 3:28 PM

## 2019-02-14 NOTE — Anesthesia Preprocedure Evaluation (Addendum)
Anesthesia Evaluation  Patient identified by MRN, date of birth, ID band Patient awake    Reviewed: Allergy & Precautions, NPO status , Patient's Chart, lab work & pertinent test results  History of Anesthesia Complications (+) PONVNegative for: history of anesthetic complications  Airway Mallampati: II  TM Distance: >3 FB Neck ROM: Full    Dental no notable dental hx.    Pulmonary neg pulmonary ROS,    Pulmonary exam normal        Cardiovascular hypertension, Normal cardiovascular exam Rhythm:Regular Rate:Normal     Neuro/Psych S/p ACDF TIAnegative psych ROS   GI/Hepatic Neg liver ROS, GERD  ,  Endo/Other  diabetes  Renal/GU negative Renal ROS  negative genitourinary   Musculoskeletal  (+) Arthritis , Fibromyalgia -  Abdominal   Peds  Hematology negative hematology ROS (+)   Anesthesia Other Findings 68 yo F for R UKA - PONV, TIA (on Plavix), HTN, DM, GERD, fibromyalgia, s/p ACDF (full ROM) - TTE 06/19/16: normal LV systolic function; grade 1 diastolic dysfunction; sclerotic aortic valve with mild AI - last dose Plavix 02/06/19  Reproductive/Obstetrics                           Anesthesia Physical Anesthesia Plan  ASA: III  Anesthesia Plan: Spinal   Post-op Pain Management:    Induction:   PONV Risk Score and Plan: 3 and Ondansetron, Dexamethasone, Propofol infusion and Treatment may vary due to age or medical condition  Airway Management Planned: Nasal Cannula and Simple Face Mask  Additional Equipment: None  Intra-op Plan:   Post-operative Plan:   Informed Consent: I have reviewed the patients History and Physical, chart, labs and discussed the procedure including the risks, benefits and alternatives for the proposed anesthesia with the patient or authorized representative who has indicated his/her understanding and acceptance.       Plan Discussed with:   Anesthesia  Plan Comments:        Anesthesia Quick Evaluation

## 2019-02-14 NOTE — Progress Notes (Signed)
Assisted Dr. Witman with right, ultrasound guided, adductor canal block. Side rails up, monitors on throughout procedure. See vital signs in flow sheet. Tolerated Procedure well. °

## 2019-02-14 NOTE — Op Note (Signed)
NAME: Rhonda Norris    MEDICAL RECORD NO.: [id number]   FACILITY: [facility]   DATE OF BIRTH: Aug 18, 1951  PHYSICIAN: Madlyn Frankel. Charlann Boxer, M.D.    DATE OF PROCEDURE: 02/14/2019    OPERATIVE REPORT   PREOPERATIVE DIAGNOSIS: Right knee medial compartment osteoarthritis.   POSTOPERATIVE DIAGNOSIS: right knee medial compartment osteoarthritis.   PROCEDURE: Right partial knee replacement utilizing Biomet Oxford knee  component, size small femur, a right medial size AA tibial tray with a size3 mm insert.   SURGEON: Madlyn Frankel. Charlann Boxer, M.D.   ASSISTANT: Leilani Able, PAC.  Please note that Rhonda Norris was present for the entirety of the case,  utilized for preoperative positioning, perioperative retractor  management, general facilitation of the case and primary wound closure.   ANESTHESIA: Regional plus spinal.   SPECIMENS: None.   COMPLICATIONS: None.  DRAINS: None   TOURNIQUET TIME: 29 minutes at 250 mmHg.   INDICATIONS FOR PROCEDURE: The patient is a 68 y.o. patient of mine who presented for evaluation of right knee pain.  They presented with primary complaints of pain on the medial side of their knee. Radiographs revealed advanced medial compartment arthritis with specifically an antero-medial wear pattern.  There was bone on bone changes noted with subchondral sclerosis and osteophytes present. The patient has had progressive problems failing to respond to conservative measures of medications, injections and activity modification. Risks of infection, DVT, component failure, need for future revision surgery were all discussed and reviewed.  Consent was obtained for benefit of pain relief.   PROCEDURE IN DETAIL: The patient was brought to the operative theater.  Once adequate anesthesia, preoperative antibiotics, 2 gm of Ancef administered, 1 gm of Tranexamic Acid, and 10 mg of Decadron given the patient was positioned in supine position with a right thigh tourniquet placed. The  operative leg was positioned over the Oxford leg holder such to allow for 120 degrees of flexion. The right lower extremity was prepped and draped in sterile fashion. A time-out  was performed identifying the patient, planned procedure, and extremity.  The operative leg was exsanguinated and the tourniquet elevated to 250 mmHg. A midline  incision was made from the proximal pole of the patella to the tibial tubercle. A  soft tissue plane was created and partial median arthrotomy was then  made to allow for subluxation of the patella. Following initial synovectomy and  debridement, the osteophytes were removed off the medial aspect of the knee and within the notch as needed.  The femur was first sized using the sizing spoons and determine to be a small femur.  The femoral spoon was then left in place to be attached to the extra medullary tibial guide.  The tibial extramedullary guide was positioned over the anterior crest of the tibia  and pinned into position, perpendicular in the coronal plane with appropriate slope.  Utilizing the size size 4 G clamp I connected the cutting block tray to the femoral spine.  Upon confirming the orientation of the cutting block as it pertained to the extra medullary guide the tibial tray was pinned in place.  I then used a reciprocating saw along the medial border of the medial tibial spine.  The reciprocating saw was then used to complete the proximal tibial osteotomy.  The proximal tibial bone was removed and identified to have complete loss of cartilage and anterior medial pattern.  The cut surface of the tibia was found to be best covered with the AA tibial tray.  At this  point I checked the tension of the medial collateral ligament at 90 degrees.  With the retractors out of the wound and the knee held at 90 degrees the 3 feeler gauge had appropriate tension on the medial ligament.   At this point, the femoral canal was opened with a 6 mm drill followed by the  starting awl and then the  intramedullary was positioned within the canal.  Then the small posterior cutting block guide set at 4 to match the flexion gap was positioned onto the cut surface of the tibia and then linked to the intramedullary rod using the articulating link.  This posterior cutting block guide was positioned over the middle portion of the medial femoral condyle in line with the normal kinematic motion of the knee.  The 2 drill holes were then made into the distal femur.  Once this was done the posterior cutting block guide and length were removed.    The posterior guide was then impacted into place on the distal femur and the posterior  femoral cut made.   At this point, based on the tension on the medial collateral ligament at 90 degrees as previously assessed I selected the size 4 spigot and pushed it to the distal femur.  I then milled the distal femur.  Excess of bone from the distal femur and then medially and laterally were removed using osteotome and rondure.  We now did our first trial reduction with the small femur and the AA tibial tray.  I now assessed the ligament balancing at 90 and 20 degrees of flexion.  The tension of the medial collateral ligament at 90 and 20 degrees of flexion was symmetric with the size3 feeler gauge.  Given these findings, the trial femoral component was removed. Final preparation of tibia was carried out by pinning it in position. Then  using a reciprocating saw I removed bone for the keel. Further bone was  removed with an osteotome.  Trial reduction was now carried out with the small femur, the AA keeled right tibia, and the size 3 lollipop insert. The balance of the  ligaments appeared to be symmetric at 20 degrees and 90 degrees. Given  all these findings, the trial components were removed.  The final components were opened. The knee was irrigated with  normal saline solution.  The medial synovial capsule junction of the knee was injected  with 30 cc of quarter percent Marcaine with epinephrine, 1 cc of Toradol, and 30 cc of normal saline.  Any final debridements of the soft tissue or osteophytes was carried out at this point.  Sclerotic bone on the distal femur and proximal tibia were drilled with the drill and the set.    The final components were cemented onto a clean and dried cut surfaces of bone. Excessive cement was removed and the knee was then held at 45 degrees of flexion with the size 3 feeler gauge until the cement cured. Excess cement was removed throughout the knee. Tourniquet was let down  after 29 minutes. After the cement had fully cured and excessive cement  was removed throughout the knee there was no visualized cement present.  Before selecting the final insert re-trialed with the size 3 lollipop insert.  Given that these findings confirmed our initial trial reduction we selected the final size 3 mm, right medial insert to match the small femur.  Following irrigation of the knee this final insert was snapped into place.   The extensor mechanism  was then  reapproximated using a #1 Vicryl and #1 Stratafix suture with the knee in flexion. The  remaining wound was closed with 2-0 Vicryl and a running 4-0 Monocryl.  The knee was cleaned, dried, and dressed sterilely using Dermabond and  Aquacel dressing. The patient  was brought to the recovery room, Ace wrap in place, tolerating the  procedure well.     Madlyn FrankelMatthew D. Charlann Boxerlin, M.D.

## 2019-02-14 NOTE — Anesthesia Postprocedure Evaluation (Signed)
Anesthesia Post Note  Patient: Aarushi Lopes Belsky  Procedure(s) Performed: UNICOMPARTMENTAL KNEE (Right )     Patient location during evaluation: PACU Anesthesia Type: Spinal Level of consciousness: oriented and awake and alert Pain management: pain level controlled Vital Signs Assessment: post-procedure vital signs reviewed and stable Respiratory status: spontaneous breathing, respiratory function stable and nonlabored ventilation Cardiovascular status: blood pressure returned to baseline and stable Postop Assessment: no headache, no backache, no apparent nausea or vomiting and spinal receding Anesthetic complications: no    Last Vitals:  Vitals:   02/14/19 1610 02/14/19 1833  BP: (!) 142/70 (!) 143/76  Pulse: 64 85  Resp: 16 16  Temp: 36.4 C 36.6 C  SpO2: 100% 100%    Last Pain:  Vitals:   02/14/19 1836  TempSrc:   PainSc: 1                  Lucretia Kern

## 2019-02-14 NOTE — Anesthesia Procedure Notes (Signed)
Anesthesia Regional Block: Adductor canal block   Pre-Anesthetic Checklist: ,, timeout performed, Correct Patient, Correct Site, Correct Laterality, Correct Procedure, Correct Position, site marked, Risks and benefits discussed,  Surgical consent,  Pre-op evaluation,  At surgeon's request and post-op pain management  Laterality: Right  Prep: chloraprep       Needles:  Injection technique: Single-shot  Needle Type: Echogenic Stimulator Needle     Needle Length: 9cm  Needle Gauge: 21     Additional Needles:   Procedures:,,,, ultrasound used (permanent image in chart),,,,  Narrative:  Start time: 02/14/2019 10:30 AM End time: 02/14/2019 10:36 AM Injection made incrementally with aspirations every 5 mL.  Performed by: Personally  Anesthesiologist: Lucretia Kern, MD  Additional Notes: Monitors applied. Injection made in 5cc increments. No resistance to injection. Good needle visualization. Patient tolerated procedure well.

## 2019-02-14 NOTE — Anesthesia Procedure Notes (Signed)

## 2019-02-14 NOTE — Transfer of Care (Signed)
Immediate Anesthesia Transfer of Care Note  Patient: Rhonda Norris  Procedure(s) Performed: Procedure(s): UNICOMPARTMENTAL KNEE (Right)  Patient Location: PACU  Anesthesia Type:Spinal  Level of Consciousness:  sedated, patient cooperative and responds to stimulation  Airway & Oxygen Therapy:Patient Spontanous Breathing and Patient connected to face mask oxgen  Post-op Assessment:  Report given to PACU RN and Post -op Vital signs reviewed and stable  Post vital signs:  Reviewed and stable  Last Vitals:  Vitals:   02/14/19 1038 02/14/19 1039  BP: 131/72 131/72  Pulse: 72 73  Resp: 14 11  Temp:    SpO2: 99% 100%    Complications: No apparent anesthesia complications

## 2019-02-14 NOTE — Evaluation (Signed)
Physical Therapy Evaluation Patient Details Name: Rhonda Norris MRN: [id number] DOB: 1951/12/02 Today's Date: 02/14/2019   History of Present Illness  68 yo female s/p R UKR on 02/14/19. PMH includes carpal tunnel, CVA, DM, fibromyalgia, osteopenia, ACDF C4-C5 2018.  Clinical Impression   Pt presents with mild R knee pain, decreased R knee ROM, increased time and effort to perform mobility tasks. Pt to benefit from acute PT to address deficits. Pt ambulated hallway distance with RW with min guard assist, verbal cuing provided for form and safety. Pt educated on ankle pumps (20/hour) to perform this afternoon/evening to increase circulation, to pt's tolerance and limited by pain. PT to progress mobility as tolerated, and will continue to follow acutely.        Follow Up Recommendations Supervision for mobility/OOB;Follow surgeon's recommendation for DC plan and follow-up therapies(OPPT)    Equipment Recommendations  3in1 (PT)    Recommendations for Other Services       Precautions / Restrictions Precautions Precautions: Fall Restrictions Weight Bearing Restrictions: No Other Position/Activity Restrictions: WBAT       Mobility  Bed Mobility Overal bed mobility: Needs Assistance Bed Mobility: Supine to Sit     Supine to sit: Min guard;HOB elevated     General bed mobility comments: min guard for safety. increased time   Transfers Overall transfer level: Needs assistance Equipment used: Rolling walker (2 wheeled) Transfers: Sit to/from Stand Sit to Stand: Min guard;From elevated surface         General transfer comment: min guard for safety. Verbal cuing for hand placement during standing (push from bed).   Ambulation/Gait Ambulation/Gait assistance: Min guard Gait Distance (Feet): 120 Feet Assistive device: Rolling walker (2 wheeled) Gait Pattern/deviations: Step-through pattern;Decreased stride length;Step-to pattern Gait velocity: decr   General Gait  Details: Min guard for safety. Verbal cuing for sequencing with step-to gait but pt quickly progressing to step-through, upright posture.   Stairs            Wheelchair Mobility    Modified Rankin (Stroke Patients Only)       Balance Overall balance assessment: Mild deficits observed, not formally tested                                           Pertinent Vitals/Pain Pain Assessment: 0-10 Pain Score: 2  Pain Location: R knee, with ambulation  Pain Descriptors / Indicators: Sore Pain Intervention(s): Limited activity within patient's tolerance;Repositioned;Ice applied;Monitored during session;Premedicated before session    Home Living Family/patient expects to be discharged to:: Private residence Living Arrangements: Spouse/significant other;Children Available Help at Discharge: Family;Available PRN/intermittently(husband, daughter, grandson (65)) Type of Home: House Home Access: Stairs to enter Entrance Stairs-Rails: Right Entrance Stairs-Number of Steps: 5 Home Layout: Two level;Bed/bath upstairs Home Equipment: Walker - 2 wheels;Cane - single point;Hand held shower head;Crutches      Prior Function Level of Independence: Independent with assistive device(s)         Comments: pt reports occasionally using cane for ambulation, uses crutches occasionally as well for sciactica      Hand Dominance   Dominant Hand: Right    Extremity/Trunk Assessment   Upper Extremity Assessment Upper Extremity Assessment: Overall WFL for tasks assessed    Lower Extremity Assessment Lower Extremity Assessment: Overall WFL for tasks assessed;RLE deficits/detail RLE Deficits / Details: able to perform ankle pumps, heel slides to 70*, quad set,  SLR with lift assist RLE Sensation: WNL    Cervical / Trunk Assessment Cervical / Trunk Assessment: Normal  Communication   Communication: No difficulties  Cognition Arousal/Alertness: Awake/alert Behavior During  Therapy: WFL for tasks assessed/performed Overall Cognitive Status: Within Functional Limits for tasks assessed                                        General Comments      Exercises Total Joint Exercises Goniometric ROM: knee aarom ~5-70*, limited by pain    Assessment/Plan    PT Assessment Patient needs continued PT services  PT Problem List Decreased strength;Pain;Decreased range of motion;Decreased activity tolerance;Decreased knowledge of use of DME;Decreased balance;Decreased mobility       PT Treatment Interventions DME instruction;Therapeutic activities;Gait training;Therapeutic exercise;Patient/family education;Stair training;Balance training;Functional mobility training    PT Goals (Current goals can be found in the Care Plan section)  Acute Rehab PT Goals Patient Stated Goal: none stated  PT Goal Formulation: With patient Time For Goal Achievement: 02/21/19 Potential to Achieve Goals: Good    Frequency 7X/week   Barriers to discharge        Co-evaluation               AM-PAC PT "6 Clicks" Mobility  Outcome Measure Help needed turning from your back to your side while in a flat bed without using bedrails?: A Little Help needed moving from lying on your back to sitting on the side of a flat bed without using bedrails?: A Little Help needed moving to and from a bed to a chair (including a wheelchair)?: A Little Help needed standing up from a chair using your arms (e.g., wheelchair or bedside chair)?: A Little Help needed to walk in hospital room?: A Little Help needed climbing 3-5 steps with a railing? : A Little 6 Click Score: 18    End of Session Equipment Utilized During Treatment: Gait belt Activity Tolerance: Patient tolerated treatment well Patient left: in chair;with chair alarm set;with call bell/phone within reach;with SCD's reapplied Nurse Communication: Mobility status PT Visit Diagnosis: Other abnormalities of gait and  mobility (R26.89);Difficulty in walking, not elsewhere classified (R26.2)    Time: 1930-1950 PT Time Calculation (min) (ACUTE ONLY): 20 min   Charges:   PT Evaluation $PT Eval Low Complexity: 1 Low          Rhonda Norris, PT Acute Rehabilitation Services Pager [phone]  Office [phone]  Rhonda Norris 02/14/2019, 8:06 PM

## 2019-02-14 NOTE — Interval H&P Note (Signed)
History and Physical Interval Note:  02/14/2019 10:00 AM  Rhonda Norris  has presented today for surgery, with the diagnosis of Right knee medial compartmental osteoarthritis  The various methods of treatment have been discussed with the patient and family. After consideration of risks, benefits and other options for treatment, the patient has consented to  Procedure(s): UNICOMPARTMENTAL KNEE (Right) as a surgical intervention .  The patient's history has been reviewed, patient examined, no change in status, stable for surgery.  I have reviewed the patient's chart and labs.  Questions were answered to the patient's satisfaction.     Shelda Pal

## 2019-02-15 ENCOUNTER — Encounter (HOSPITAL_COMMUNITY): Payer: Self-pay | Admitting: Orthopedic Surgery

## 2019-02-15 DIAGNOSIS — M1711 Unilateral primary osteoarthritis, right knee: Secondary | ICD-10-CM | POA: Diagnosis not present

## 2019-02-15 LAB — BASIC METABOLIC PANEL
Anion gap: 9 (ref 5–15)
BUN: 13 mg/dL (ref 8–23)
CO2: 25 mmol/L (ref 22–32)
Calcium: 8.8 mg/dL — ABNORMAL LOW (ref 8.9–10.3)
Chloride: 105 mmol/L (ref 98–111)
Creatinine, Ser: 0.68 mg/dL (ref 0.44–1.00)
GFR calc Af Amer: >60 mL/min (ref >60)
GLUCOSE: 104 mg/dL — AB (ref 70–99)
Potassium: 3.9 mmol/L (ref 3.5–5.1)
Sodium: 139 mmol/L (ref 135–145)

## 2019-02-15 LAB — CBC
HCT: 34 % — ABNORMAL LOW (ref 36.0–46.0)
Hemoglobin: 10.4 g/dL — ABNORMAL LOW (ref 12.0–15.0)
MCH: 28.3 pg (ref 26.0–34.0)
MCHC: 30.6 g/dL (ref 30.0–36.0)
MCV: 92.6 fL (ref 80.0–100.0)
Platelets: 304 10*3/uL (ref 150–400)
RBC: 3.67 MIL/uL — ABNORMAL LOW (ref 3.87–5.11)
RDW: 13.3 % (ref 11.5–15.5)
WBC: 12.1 10*3/uL — ABNORMAL HIGH (ref 4.0–10.5)
nRBC: 0 % (ref 0.0–0.2)

## 2019-02-15 MED ORDER — HYDROCODONE-ACETAMINOPHEN 7.5-325 MG PO TABS: 1.0000 | ORAL_TABLET | ORAL | 0 refills | Status: DC | PRN

## 2019-02-15 MED ORDER — FERROUS SULFATE 325 (65 FE) MG PO TABS: 325.0000 mg | ORAL_TABLET | Freq: Two times a day (BID) | ORAL | 3 refills | Status: DC

## 2019-02-15 NOTE — Care Management Note (Signed)
Case Management Note  Patient Details  Name: Lilley Fleege Andreasen MRN: [id number] Date of Birth: 09/09/1951  Subjective/Objective:                  Discharge planning  Action/Plan: Has walker at home, 3 in 1 ordered through advanced dme Plans to do OOPT at [facility] on March 2,2020  Expected Discharge Date:  02/15/19               Expected Discharge Plan:  Home/Self Care  In-House Referral:     Discharge planning Services  CM Consult  Post Acute Care Choice:  Durable Medical Equipment Choice offered to:  Patient  DME Arranged:  Bedside commode DME Agency:  [facility]  HH Arranged:    [facility] Agency:     Status of Service:  Completed, signed off  If discussed at Long Length of Stay Meetings, dates discussed:    Additional Comments:  Golda Acre, RN 02/15/2019, 9:55 AM

## 2019-02-15 NOTE — Care Management Obs Status (Signed)
MEDICARE OBSERVATION STATUS NOTIFICATION   Patient Details  Name: Rhonda Norris MRN: [id number] Date of Birth: [birth year]   Medicare Observation Status Notification Given:  Yes    Golda Acre, RN 02/15/2019, 10:11 AM

## 2019-02-15 NOTE — Progress Notes (Signed)
   Subjective: 1 Day Post-Op Procedure(s) (LRB): UNICOMPARTMENTAL KNEE (Right) Patient reports pain as mild.   Patient seen in rounds for Dr. Charlann Boxer. Patient is well, and has had no acute complaints or problems. No acute events overnight. She states she is ready to go home today. Foley catheter removed, positive flatus. Denies CP, SHOB, calf pain.  We will continue therapy today.   Objective: Vital signs in last 24 hours: Temp:  [97.4 F (36.3 C)-98 F (36.7 C)] 97.7 F (36.5 C) (02/28 0519) Pulse Rate:  [54-85] 63 (02/28 0519) Resp:  [8-18] 16 (02/28 0519) BP: (113-161)/(62-87) 139/76 (02/28 0519) SpO2:  [94 %-100 %] 100 % (02/28 0519) Weight:  [64.9 kg] 64.9 kg (02/27 0902)  Intake/Output from previous day:  Intake/Output Summary (Last 24 hours) at 02/15/2019 0824 Last data filed at 02/15/2019 0541 Gross per 24 hour  Intake 3186.23 ml  Output 3450 ml  Net -263.77 ml     Intake/Output this shift: No intake/output data recorded.  Labs: Recent Labs    02/15/19 0448  HGB 10.4*   Recent Labs    02/15/19 0448  WBC 12.1*  RBC 3.67*  HCT 34.0*  PLT 304   Recent Labs    02/15/19 0448  NA 139  K 3.9  CL 105  CO2 25  BUN 13  CREATININE 0.68  GLUCOSE 104*  CALCIUM 8.8*   Recent Labs    02/14/19 0922  INR 0.8    Exam: General - Patient is Alert and Oriented Extremity - Neurologically intact Sensation intact distally Intact pulses distally Dorsiflexion/Plantar flexion intact Dressing - dressing C/D/I Motor Function - intact, moving foot and toes well on exam.   Past Medical History:  Diagnosis Date  . Allergic rhinitis   . Arthritis   . Carpal tunnel syndrome   . Diabetes mellitus without complication (HCC) 02/08/2017   ON METFORMIN BID   . Elevated cholesterol   . Fibromyalgia   . GERD (gastroesophageal reflux disease)   . HTN (hypertension)   . IBD (inflammatory bowel disease)   . Osteopenia 03/2018   T score -2.1 FRAX 3.7% / 0.5%  .  Pancreatitis, gallstone   . PONV (postoperative nausea and vomiting)    NAUSEA AFTER KNEE ARTHROSCOPY   . Stroke [facility])     Assessment/Plan: 1 Day Post-Op Procedure(s) (LRB): UNICOMPARTMENTAL KNEE (Right) Active Problems:   Status post right partial knee replacement  Estimated body mass index is 25.33 kg/m as calculated from the following:   Height as of this encounter: 5\' 3"  (1.6 m).   Weight as of this encounter: 64.9 kg. Advance diet Up with therapy D/C IV fluids    DVT Prophylaxis - Plavix Weight bearing as tolerated. D/C O2 and pulse ox and try on room air.   Plan is to go Home after hospital stay. Plan for discharge today as long as she continues to meet goals with therapy. She is scheduled for outpatient PT at [facility] on Monday. She will follow up in the office in 2 weeks with Dr. Charlann Boxer.   Dennie Bible, PA-C Orthopedic Surgery 02/15/2019, 8:24 AM

## 2019-02-15 NOTE — Progress Notes (Signed)
Physical Therapy Treatment Patient Details Name: Rhonda Norris MRN: [id number] DOB: 17-Apr-1951 Today's Date: 02/15/2019    History of Present Illness 68 yo female s/p R UKR on 02/14/19. PMH includes carpal tunnel, CVA, DM, fibromyalgia, osteopenia, ACDF C4-C5 2018.    PT Comments    Pt motivated and progressing well with mobility.  Pt reviewed home therex and stairs with written instruction provided.   Follow Up Recommendations  Supervision for mobility/OOB;Follow surgeon's recommendation for DC plan and follow-up therapies     Equipment Recommendations  3in1 (PT)    Recommendations for Other Services       Precautions / Restrictions Precautions Precautions: Fall Restrictions Weight Bearing Restrictions: No Other Position/Activity Restrictions: WBAT     Mobility  Bed Mobility Overal bed mobility: Needs Assistance Bed Mobility: Supine to Sit;Sit to Supine     Supine to sit: Supervision Sit to supine: Supervision      Transfers Overall transfer level: Needs assistance Equipment used: Rolling walker (2 wheeled) Transfers: Sit to/from Stand Sit to Stand: Min guard;Supervision         General transfer comment: min guard for safety. Verbal cuing for hand placement during standing (push from bed).   Ambulation/Gait Ambulation/Gait assistance: Min guard;Supervision Gait Distance (Feet): 200 Feet Assistive device: Rolling walker (2 wheeled) Gait Pattern/deviations: Step-to pattern;Step-through pattern;Decreased step length - right;Decreased step length - left;Shuffle;Trunk flexed Gait velocity: decr   General Gait Details: Min guard for safety. Verbal cuing for sequencing with step-to gait but pt quickly progressing to step-through, upright posture.    Stairs Stairs: Yes Stairs assistance: Min assist Stair Management: One rail Right;Step to pattern;Forwards;With cane Number of Stairs: 5 General stair comments: cues for sequence and foot/cane  placement   Wheelchair Mobility    Modified Rankin (Stroke Patients Only)       Balance Overall balance assessment: Mild deficits observed, not formally tested                                          Cognition Arousal/Alertness: Awake/alert Behavior During Therapy: WFL for tasks assessed/performed Overall Cognitive Status: Within Functional Limits for tasks assessed                                        Exercises Total Joint Exercises Ankle Circles/Pumps: AROM;15 reps;Supine;Both Quad Sets: AROM;Both;15 reps;Supine Heel Slides: AAROM;Right;15 reps;Supine Straight Leg Raises: AAROM;AROM;Right;15 reps;Supine Knee Flexion: AAROM;AROM;Right;10 reps;Supine Goniometric ROM: knee aarom -5 - 55 - pain limited    General Comments        Pertinent Vitals/Pain Pain Assessment: 0-10 Pain Score: 4  Pain Location: R knee, with ambulation  Pain Descriptors / Indicators: Sore Pain Intervention(s): Limited activity within patient's tolerance;Monitored during session;Premedicated before session;Ice applied    Home Living                      Prior Function            PT Goals (current goals can now be found in the care plan section) Acute Rehab PT Goals Patient Stated Goal: none stated  PT Goal Formulation: With patient Time For Goal Achievement: 02/21/19 Potential to Achieve Goals: Good Progress towards PT goals: Progressing toward goals    Frequency    7X/week  PT Plan Current plan remains appropriate    Co-evaluation              AM-PAC PT "6 Clicks" Mobility   Outcome Measure  Help needed turning from your back to your side while in a flat bed without using bedrails?: A Little Help needed moving from lying on your back to sitting on the side of a flat bed without using bedrails?: A Little Help needed moving to and from a bed to a chair (including a wheelchair)?: A Little Help needed standing up from a  chair using your arms (e.g., wheelchair or bedside chair)?: A Little Help needed to walk in hospital room?: A Little Help needed climbing 3-5 steps with a railing? : A Little 6 Click Score: 18    End of Session Equipment Utilized During Treatment: Gait belt Activity Tolerance: Patient tolerated treatment well Patient left: in bed;with call bell/phone within reach;with family/visitor present Nurse Communication: Mobility status PT Visit Diagnosis: Other abnormalities of gait and mobility (R26.89);Difficulty in walking, not elsewhere classified (R26.2)     Time: 1505-6979 PT Time Calculation (min) (ACUTE ONLY): 40 min  Charges:  $Gait Training: 8-22 mins $Therapeutic Exercise: 8-22 mins $Therapeutic Activity: 8-22 mins                     Mauro Kaufmann PT Acute Rehabilitation Services Pager [phone] Office [phone]    Rhonda Norris 02/15/2019, 1:13 PM

## 2019-02-15 NOTE — Discharge Instructions (Signed)

## 2019-02-18 NOTE — Discharge Summary (Signed)
Physician Discharge Summary   Patient ID: Rhonda Norris MRN: [id number] DOB/AGE: [birth year] 68 y.o.  Admit date: 02/14/2019 Discharge date: 02/15/2019  Primary Diagnosis: Right knee medial compartment osteoarthritis  Admission Diagnoses:  Past Medical History:  Diagnosis Date  . Allergic rhinitis   . Arthritis   . Carpal tunnel syndrome   . Diabetes mellitus without complication (HCC) 02/08/2017   ON METFORMIN BID   . Elevated cholesterol   . Fibromyalgia   . GERD (gastroesophageal reflux disease)   . HTN (hypertension)   . IBD (inflammatory bowel disease)   . Osteopenia 03/2018   T score -2.1 FRAX 3.7% / 0.5%  . Pancreatitis, gallstone   . PONV (postoperative nausea and vomiting)    NAUSEA AFTER KNEE ARTHROSCOPY   . Stroke [facility])    Discharge Diagnoses:   Active Problems:   Status post right partial knee replacement  Estimated body mass index is 25.33 kg/m as calculated from the following:   Height as of this encounter:  (1.6 m).   Weight as of this encounter: 64.9 kg.  Procedure:  Procedure(s) (LRB): UNICOMPARTMENTAL KNEE (Right)   Consults: None  HPI: Rhonda Norris, 68 y.o. female, has a history of pain and functional disability in the right and has failed non-surgical conservative treatments for greater than 12 weeks to include NSAID's and/or analgesics, corticosteriod injections, viscosupplementation injections and activity modification.  Onset of symptoms was gradual, starting 1+ years ago with gradually worsening course since that time. The patient noted prior procedures on the knee to include  arthroscopy and menisectomy on the right knee.  Patient currently rates pain in the right knee at 10 out of 10 with activity. Patient has night pain, worsening of pain with activity and weight bearing, pain that interferes with activities of daily living, pain with passive range of motion, crepitus and joint swelling.  Patient has evidence of  periarticular osteophytes and joint space narrowing of the medial compartment by imaging studies.  There is no active infection.  Laboratory Data: Admission on 02/14/2019, Discharged on 02/15/2019  Component Date Value Ref Range Status  . Prothrombin Time 02/14/2019 10.8* 11.4 - 15.2 seconds Final  . INR 02/14/2019 0.8  0.8 - 1.2 Final   Comment: (NOTE) INR goal varies based on device and disease states. Performed at [facility], [address] [place], [place] [address]   . Glucose-Capillary 02/14/2019 97  70 - 99 mg/dL Final  . Glucose-Capillary 02/14/2019 62* 70 - 99 mg/dL Final  . Comment 1 78/29/5621 Notify RN   Final  . Comment 2 02/14/2019 Document in Chart   Final  . Glucose-Capillary 02/14/2019 70  70 - 99 mg/dL Final  . Comment 1 30/86/5784 Notify RN   Final  . Comment 2 02/14/2019 Document in Chart   Final  . WBC 02/15/2019 12.1* 4.0 - 10.5 K/uL Final  . RBC 02/15/2019 3.67* 3.87 - 5.11 MIL/uL Final  . Hemoglobin 02/15/2019 10.4* 12.0 - 15.0 g/dL Final  . HCT 69/62/9528 34.0* 36.0 - 46.0 % Final  . MCV 02/15/2019 92.6  80.0 - 100.0 fL Final  . MCH 02/15/2019 28.3  26.0 - 34.0 pg Final  . MCHC 02/15/2019 30.6  30.0 - 36.0 g/dL Final  . RDW 41/32/4401 13.3  11.5 - 15.5 % Final  . Platelets 02/15/2019 304  150 - 400 K/uL Final  . nRBC 02/15/2019 0.0  0.0 - 0.2 % Final   Performed at [facility], [address] [place], [place] [address]  . Sodium 02/15/2019 139  135 - 145 mmol/L Final  . Potassium 02/15/2019 3.9  3.5 - 5.1 mmol/L Final  . Chloride 02/15/2019 105  98 - 111 mmol/L Final  . CO2 02/15/2019 25  22 - 32 mmol/L Final  . Glucose, Bld 02/15/2019 104* 70 - 99 mg/dL Final  . BUN 10/27/253602/28/2020 13  8 - 23 mg/dL Final  . Creatinine, Ser 02/15/2019 0.68  0.44 - 1.00 mg/dL Final  . Calcium 64/40/347402/28/2020 8.8* 8.9 - 10.3 mg/dL Final  . GFR calc non Af Amer 02/15/2019 >60  >60 mL/min Final  . GFR calc Af Amer 02/15/2019 >60  >60 mL/min  Final  . Anion gap 02/15/2019 9  5 - 15 Final   Performed at [facility], [address] [place], [place] [address]  Hospital Outpatient Visit on 02/07/2019  Component Date Value Ref Range Status  . Glucose-Capillary 02/07/2019 80  70 - 99 mg/dL Final  . Hgb L8VA1c MFr Bld 02/07/2019 6.0* 4.8 - 5.6 % Final   Comment: (NOTE)         Prediabetes: 5.7 - 6.4         Diabetes: >6.4         Glycemic control for adults with diabetes: <7.0   . Mean Plasma Glucose 02/07/2019 126  mg/dL Final   Comment: (NOTE) Performed At: [facility] [address] [place], [place] [id number] Jolene SchimkeNagendra Sanjai MD OA:[phone]   . Sodium 02/07/2019 139  135 - 145 mmol/L Final  . Potassium 02/07/2019 3.9  3.5 - 5.1 mmol/L Final  . Chloride 02/07/2019 103  98 - 111 mmol/L Final  . CO2 02/07/2019 28  22 - 32 mmol/L Final  . Glucose, Bld 02/07/2019 91  70 - 99 mg/dL Final  . BUN 01/09/323502/20/2020 14  8 - 23 mg/dL Final  . Creatinine, Ser 02/07/2019 0.72  0.44 - 1.00 mg/dL Final  . Calcium 57/32/202502/20/2020 9.6  8.9 - 10.3 mg/dL Final  . GFR calc non Af Amer 02/07/2019 >60  >60 mL/min Final  . GFR calc Af Amer 02/07/2019 >60  >60 mL/min Final  . Anion gap 02/07/2019 8  5 - 15 Final   Performed at [facility], [address] [place], [place] [address]  . WBC 02/07/2019 6.0  4.0 - 10.5 K/uL Final  . RBC 02/07/2019 4.00  3.87 - 5.11 MIL/uL Final  . Hemoglobin 02/07/2019 11.5* 12.0 - 15.0 g/dL Final  . HCT 23/76/283102/20/2020 36.7  36.0 - 46.0 % Final  . MCV 02/07/2019 91.8  80.0 - 100.0 fL Final  . MCH 02/07/2019 28.8  26.0 - 34.0 pg Final  . MCHC 02/07/2019 31.3  30.0 - 36.0 g/dL Final  . RDW 51/76/160702/20/2020 13.2  11.5 - 15.5 % Final  . Platelets 02/07/2019 357  150 - 400 K/uL Final  . nRBC 02/07/2019 0.0  0.0 - 0.2 % Final   Performed at [facility], [address] [place], [place] [address]  . ABO/RH(D) 02/07/2019 O POS   Final  . Antibody Screen 02/07/2019 NEG    Final  . Sample Expiration 02/07/2019 02/17/2019   Final  . Extend sample reason 02/07/2019    Final                   Value:NO TRANSFUSIONS OR PREGNANCY IN THE PAST 3 MONTHS Performed at [facility], [address] [place], [place] [address]   . MRSA,  PCR 02/07/2019 NEGATIVE  NEGATIVE Final  . Staphylococcus aureus 02/07/2019 NEGATIVE  NEGATIVE Final   Comment: (NOTE) The Xpert SA Assay (FDA approved for NASAL specimens in patients 22 years of age and older), is one component of a comprehensive surveillance program. It is not intended to diagnose infection nor to guide or monitor treatment. Performed at [facility], [address] [place], [place] [address]   . ABO/RH(D) 02/07/2019    Final                   Value:O POS Performed at [facility], [address], [place], [place] [address]      X-Rays:No results found.  EKG: Orders placed or performed during the hospital encounter of 02/10/18  . ED EKG  . ED EKG  . EKG 12-Lead  . EKG 12-Lead  . EKG     Hospital Course: Keiko Myricks Askin is a 68 y.o. who was admitted to [facility]. They were brought to the operating room on 02/14/2019 and underwent Procedure(s): UNICOMPARTMENTAL KNEE.  Patient tolerated the procedure well and was later transferred to the recovery room and then to the orthopaedic floor for postoperative care. They were given PO and IV analgesics for pain control following their surgery. They were given 24 hours of postoperative antibiotics of  Anti-infectives (From admission, onward)   Start     Dose/Rate Route Frequency Ordered Stop   02/14/19 1730  ceFAZolin (ANCEF) IVPB 2g/100 mL premix     2 g 200 mL/hr over 30 Minutes Intravenous Every 6 hours 02/14/19 1513 02/14/19 2333   02/14/19 0845  ceFAZolin (ANCEF) IVPB 2g/100 mL premix     2 g 200 mL/hr over 30 Minutes Intravenous On call to O.R. 02/14/19 0840 02/14/19 1117     and  started on DVT prophylaxis in the form of Plavix.   PT and OT were ordered for total joint protocol. Discharge planning consulted to help with postop disposition and equipment needs.  Patient had a good night on the evening of surgery. They started to get up OOB with therapy on POD #0. Pt was seen during rounds and was ready to go home pending progress with therapy. She worked with therapy on POD #1 and was meeting her goals. Pt was discharged to home later that day in stable condition.  Diet: Regular diet Activity: WBAT Follow-up: in 2 weeks Disposition: Home Discharged Condition: good   Discharge Instructions    Call MD / Call 911   Complete by:  As directed    If you experience chest pain or shortness of breath, CALL 911 and be transported to the hospital emergency room.  If you develope a fever above 101 F, pus (white drainage) or increased drainage or redness at the wound, or calf pain, call your surgeon's office.   Change dressing   Complete by:  As directed    Maintain surgical dressing until follow up in the clinic. If the edges start to pull up, may reinforce with tape. If the dressing is no longer working, may remove and cover with gauze and tape, but must keep the area dry and clean.  Call with any questions or concerns.   Change dressing   Complete by:  As directed    Maintain surgical dressing until follow up in the clinic. If the edges start to pull up, may reinforce with tape. If the dressing is no longer working, may remove and cover with gauze and  tape, but must keep the area dry and clean.  Call with any questions or concerns.   Constipation Prevention   Complete by:  As directed    Drink plenty of fluids.  Prune juice may be helpful.  You may use a stool softener, such as Colace (over the counter) 100 mg twice a day.  Use MiraLax (over the counter) for constipation as needed.   Diet - low sodium heart healthy   Complete by:  As directed    Discharge instructions   Complete  by:  As directed    Maintain surgical dressing until follow up in the clinic. If the edges start to pull up, may reinforce with tape. If the dressing is no longer working, may remove and cover with gauze and tape, but must keep the area dry and clean.  Follow up in 2 weeks at [facility]. Call with any questions or concerns.   Increase activity slowly as tolerated   Complete by:  As directed    Weight bearing as tolerated with assist device (walker, cane, etc) as directed, use it as long as suggested by your surgeon or therapist, typically at least 4-6 weeks.   TED hose   Complete by:  As directed    Use stockings (TED hose) for 2 weeks on both leg(s).  You may remove them at night for sleeping.   TED hose   Complete by:  As directed    Use stockings (TED hose) for 2 weeks on both leg(s).  You may remove them at night for sleeping.     Allergies as of 02/15/2019      Reactions   Dexilant [dexlansoprazole] Hives   Sulfa Antibiotics Other (See Comments)   Headaches   Aspirin    Sulfanilamide    Methocarbamol    Not effective      Medication List    STOP taking these medications   naproxen 500 MG tablet Commonly known as:  NAPROSYN   naproxen sodium 220 MG tablet Commonly known as:  ALEVE     TAKE these medications   acidophilus Caps capsule Take 1 capsule by mouth daily.   amLODipine 5 MG tablet Commonly known as:  NORVASC Take 1 tablet (5 mg total) by mouth daily.   BIOTIN PO Take 1 tablet by mouth daily.   CALCIUM 600+D 600-200 MG-UNIT Tabs Generic drug:  Calcium Carbonate-Vitamin D Take 1 tablet by mouth daily.   CINNAMON PO Take 1 capsule by mouth daily.   clopidogrel 75 MG tablet Commonly known as:  PLAVIX Take 1 tablet (75 mg total) by mouth daily.   CoQ10 400 MG Caps Take 1 capsule by mouth daily.   Cranberry 250 MG Caps Take 250 mg by mouth daily as needed (for UTI prevention).   diphenhydrAMINE 25 MG tablet Commonly known as:   BENADRYL Take 25 mg by mouth every 6 (six) hours as needed for allergies.   ESTRACE PO Take 1 tablet by mouth daily.   ferrous sulfate 325 (65 FE) MG tablet Take 1 tablet (325 mg total) by mouth 2 (two) times daily with a meal.   Fish Oil 1000 MG Caps Take 1,000 mg by mouth daily.   GINGER ROOT PO Take 1 capsule by mouth daily.   Ginkgo Biloba 120 MG Caps Take 120 mg by mouth daily.   Glucosamine HCl 1500 MG Tabs Take 1,500 mg by mouth daily.   HAIR SKIN & NAILS ADVANCED Tabs Take 1 tablet by mouth daily.  HYDROcodone-acetaminophen 7.5-325 MG tablet Commonly known as:  NORCO Take 1-2 tablets by mouth every 4 (four) hours as needed for severe pain (pain score 7-10).   loratadine 10 MG tablet Commonly known as:  CLARITIN Take 10 mg by mouth daily.   Melatonin 3 MG Tabs Take 3 mg by mouth at bedtime as needed (for sleep).   metFORMIN 500 MG tablet Commonly known as:  GLUCOPHAGE Take 500 mg by mouth 2 (two) times daily with a meal.   multivitamin with minerals Tabs tablet Take 1 tablet by mouth daily.   NASONEX 50 MCG/ACT nasal spray Generic drug:  mometasone Place 50 mcg into the nose daily as needed (congestion).   nortriptyline 25 MG capsule Commonly known as:  PAMELOR Take 25 mg by mouth at bedtime.   polyethylene glycol packet Commonly known as:  MIRALAX / GLYCOLAX Take 17 g by mouth daily as needed for moderate constipation.   pravastatin 40 MG tablet Commonly known as:  PRAVACHOL Take 1 tablet (40 mg total) by mouth every evening.   TURMERIC PO Take 1 tablet by mouth daily.   Vitamin C 500 MG Caps Take 500 mg by mouth daily as needed (cold symptoms).   VITAMIN D PO Take 1 capsule by mouth daily.   vitamin E 100 UNIT capsule Take 100 Units by mouth daily.            Discharge Care Instructions  (From admission, onward)         Start     Ordered   02/15/19 0000  Change dressing    Comments:  Maintain surgical dressing until follow up  in the clinic. If the edges start to pull up, may reinforce with tape. If the dressing is no longer working, may remove and cover with gauze and tape, but must keep the area dry and clean.  Call with any questions or concerns.   02/15/19 0831   02/15/19 0000  Change dressing    Comments:  Maintain surgical dressing until follow up in the clinic. If the edges start to pull up, may reinforce with tape. If the dressing is no longer working, may remove and cover with gauze and tape, but must keep the area dry and clean.  Call with any questions or concerns.   02/15/19 0831         Follow-up Information    Durene Romans, MD. Schedule an appointment as soon as possible for a visit in 2 week(s).   Specialty:  Orthopedic Surgery Contact information: [address] [place] [address] [phone]           Signed: Dennie Bible, PA-C Orthopedic Surgery 02/18/2019, 7:48 AM

## 2019-02-27 ENCOUNTER — Other Ambulatory Visit: Payer: Self-pay | Admitting: Cardiology

## 2019-03-29 ENCOUNTER — Encounter: Payer: Medicare Other | Admitting: Gynecology

## 2019-04-04 ENCOUNTER — Encounter: Payer: Medicare Other | Admitting: Gynecology

## 2019-04-30 ENCOUNTER — Other Ambulatory Visit: Payer: Self-pay | Admitting: Family Medicine

## 2019-04-30 DIAGNOSIS — N6320 Unspecified lump in the left breast, unspecified quadrant: Secondary | ICD-10-CM

## 2019-05-21 ENCOUNTER — Ambulatory Visit
Admission: RE | Admit: 2019-05-21 | Discharge: 2019-05-21 | Disposition: A | Discharge status: Home / Self Care | Payer: Medicare Other | Source: Ambulatory Visit | Attending: Family Medicine | Admitting: Family Medicine

## 2019-05-21 ENCOUNTER — Other Ambulatory Visit: Payer: Self-pay

## 2019-05-21 DIAGNOSIS — N6320 Unspecified lump in the left breast, unspecified quadrant: Secondary | ICD-10-CM

## 2019-06-25 ENCOUNTER — Other Ambulatory Visit: Payer: Self-pay

## 2019-06-26 ENCOUNTER — Encounter: Payer: Self-pay | Admitting: Gynecology

## 2019-06-26 ENCOUNTER — Ambulatory Visit (INDEPENDENT_AMBULATORY_CARE_PROVIDER_SITE_OTHER): Payer: Medicare Other | Admitting: Gynecology

## 2019-06-26 VITALS — BP 124/78 | Ht 65.0 in | Wt 163.0 lb

## 2019-06-26 DIAGNOSIS — Z01411 Encounter for gynecological examination (general) (routine) with abnormal findings: Secondary | ICD-10-CM

## 2019-06-26 DIAGNOSIS — N952 Postmenopausal atrophic vaginitis: Secondary | ICD-10-CM

## 2019-06-26 DIAGNOSIS — M8580 Other specified disorders of bone density and structure, unspecified site: Secondary | ICD-10-CM | POA: Diagnosis not present

## 2019-06-26 NOTE — Patient Instructions (Signed)
Follow-up in 1 year for annual exam, sooner as needed. 

## 2019-06-26 NOTE — Progress Notes (Signed)
    [place] 22-Feb-1951 [id number]        68 y.o.  G3P3 for annual gynecologic exam.  Doing well without gynecologic complaints  Past medical history,surgical history, problem list, medications, allergies, family history and social history were all reviewed and documented as reviewed in the EPIC chart.  ROS:  Performed with pertinent positives and negatives included in the history, assessment and plan.   Additional significant findings : None   Exam: Caryn Bee assistant Vitals:   06/26/19 1200  BP: 124/78  Weight: 163 lb (73.9 kg)  Height: 5\' 5"  (1.651 m)   Body mass index is 27.12 kg/m.  General appearance:  Normal affect, orientation and appearance. Skin: Grossly normal HEENT: Without gross lesions.  No cervical or supraclavicular adenopathy. Thyroid normal.  Lungs:  Clear without wheezing, rales or rhonchi Cardiac: RR, without RMG Abdominal:  Soft, nontender, without masses, guarding, rebound, organomegaly or hernia Breasts:  Examined lying and sitting without masses, retractions, discharge or axillary adenopathy. Pelvic:  Ext, BUS, Vagina: Normal with atrophic changes  Adnexa: Without masses or tenderness    Anus and perineum: Normal   Rectovaginal: Normal sphincter tone without palpated masses or tenderness.    Assessment/Plan:  67 y.o. G3P3 female for annual gynecologic exam.  Status post Isabella for leiomyoma and endometriosis  1. Postmenopausal.  No significant menopausal symptoms. 2. Pap smear 2019.  No Pap smear done today.  No history of significant abnormal Pap smears.  Options to stop screening per current screening guidelines reviewed. 3. Mammography 12/2018.  Continue with annual mammography when due.  Breast exam normal today. 4. Osteopenia.  DEXA 2019 T score -2.1 FRAX 3.7% / 0.5%.  Plan repeat DEXA next year at 2-year interval. 5. Colonoscopy 2014.  Repeat at their recommended interval. 6. Health maintenance.  No routine lab work done  as patient does this elsewhere.  Follow-up 1 year, sooner as needed.   Anastasio Auerbach MD, 12:21 PM 06/26/2019

## 2019-07-22 ENCOUNTER — Other Ambulatory Visit: Payer: Self-pay | Admitting: Cardiology

## 2019-09-25 ENCOUNTER — Encounter: Payer: Self-pay | Admitting: Gynecology

## 2019-11-01 ENCOUNTER — Telehealth: Payer: Self-pay | Admitting: Internal Medicine

## 2019-11-01 MED ORDER — AMLODIPINE BESYLATE 5 MG PO TABS: 5.0000 mg | ORAL_TABLET | Freq: Every day | ORAL | 0 refills | Status: DC

## 2019-11-01 MED ORDER — PRAVASTATIN SODIUM 40 MG PO TABS: 40.0000 mg | ORAL_TABLET | Freq: Every evening | ORAL | 0 refills | Status: DC

## 2019-11-01 NOTE — Telephone Encounter (Signed)
Pt's medication was sent to pt's pharmacy as requested. Confirmation received.  °

## 2019-11-01 NOTE — Telephone Encounter (Signed)
New Message  *STAT* If patient is at the pharmacy, call can be transferred to refill team.   1. Which medications need to be refilled? (please list name of each medication and dose if known)  pravastatin (PRAVACHOL) 40 MG tablet amLODipine (NORVASC) 5 MG tablet  2. Which pharmacy/location (including street and city if local pharmacy) is medication to be sent to? [place], [place]  3. Do they need a 30 day or 90 day supply? 90 day

## 2019-11-28 NOTE — Progress Notes (Signed)
Cardiology Office Note   Date:  11/29/2019   ID:  Rhonda Norris, DOB 1951/08/31, MRN [id number]  PCP:  Catha Gosselin, MD  Cardiologist:  New    F/U of dyspnea  History of Present Illness: Rhonda Norris is a 68 y.o. female with a history of HL, Type 2 DM, TIA fs seizure(2017), SOB, achiness  She is folowed by K Little  Hx of achiness and SOB on simvistatin    I saw the pt in 2019.  In the interval, the patient has done well from a cardiac standpoint.  She denies chest pain.  Breathing is okay.  She is very active caring for her brother who had bilateral amputations.  Patient takes her blood pressure at home and is usually lower in the 120s Current Meds  Medication Sig  . acidophilus (RISAQUAD) CAPS capsule Take 1 capsule by mouth daily.  Marland Kitchen amLODipine (NORVASC) 5 MG tablet Take 1 tablet (5 mg total) by mouth daily. Please keep upcoming appt in December with Dr. Tenny Craw for future refills. Thank you  . Ascorbic Acid (VITAMIN C) 500 MG CAPS Take 500 mg by mouth daily as needed (cold symptoms).   Marland Kitchen BIOTIN PO Take 1 tablet by mouth daily.  . Calcium Carbonate-Vitamin D (CALCIUM 600+D) 600-200 MG-UNIT TABS Take 1 tablet by mouth daily.   Marland Kitchen CINNAMON PO Take 1 capsule by mouth daily.  . clopidogrel (PLAVIX) 75 MG tablet Take 1 tablet (75 mg total) by mouth daily.  . Coenzyme Q10 (COQ10) 400 MG CAPS Take 1 capsule by mouth daily.  . Cranberry 250 MG CAPS Take 250 mg by mouth daily as needed (for UTI prevention).   . diphenhydrAMINE (BENADRYL) 25 MG tablet Take 25 mg by mouth every 6 (six) hours as needed for allergies.  . ferrous sulfate 325 (65 FE) MG tablet Take 1 tablet (325 mg total) by mouth 2 (two) times daily with a meal.  . Ginger, Zingiber officinalis, (GINGER ROOT PO) Take 1 capsule by mouth daily.  . Ginkgo Biloba 120 MG CAPS Take 120 mg by mouth daily.  . Glucosamine HCl 1500 MG TABS Take 1,500 mg by mouth daily.   Marland Kitchen HYDROcodone-acetaminophen (NORCO) 7.5-325  MG tablet Take 1-2 tablets by mouth every 4 (four) hours as needed for severe pain (pain score 7-10).  Marland Kitchen loratadine (CLARITIN) 10 MG tablet Take 10 mg by mouth daily.  . Melatonin 3 MG TABS Take 3 mg by mouth at bedtime as needed (for sleep).   . metFORMIN (GLUCOPHAGE) 500 MG tablet Take 500 mg by mouth 2 (two) times daily with a meal.   . mometasone (NASONEX) 50 MCG/ACT nasal spray Place 50 mcg into the nose daily as needed (congestion).   . Multiple Vitamin (MULTIVITAMIN WITH MINERALS) TABS tablet Take 1 tablet by mouth daily.  . Multiple Vitamins-Minerals (HAIR SKIN & NAILS ADVANCED) TABS Take 1 tablet by mouth daily.  . nortriptyline (PAMELOR) 25 MG capsule Take 25 mg by mouth at bedtime.   . Omega-3 Fatty Acids (FISH OIL) 1000 MG CAPS Take 1,000 mg by mouth daily.  . polyethylene glycol (MIRALAX / GLYCOLAX) packet Take 17 g by mouth daily as needed for moderate constipation.   . pravastatin (PRAVACHOL) 40 MG tablet Take 1 tablet (40 mg total) by mouth every evening. Please keep upcoming appt in December with Dr. Tenny Craw for future refills. Thank you  . TURMERIC PO Take 1 tablet by mouth daily.  Marland Kitchen VITAMIN D PO Take 1 capsule by  mouth daily.  . vitamin E 100 UNIT capsule Take 100 Units by mouth daily.      Allergies:   Dexilant [dexlansoprazole], Sulfa antibiotics, Aspirin, Caffeine, Sulfanilamide, and Methocarbamol   Past Medical History:  Diagnosis Date  . Allergic rhinitis   . Arthritis   . Carpal tunnel syndrome   . Diabetes mellitus without complication ([place]) 12/45/8099   ON METFORMIN BID   . Elevated cholesterol   . Fibromyalgia   . GERD (gastroesophageal reflux disease)   . HTN (hypertension)   . IBD (inflammatory bowel disease)   . Osteopenia 03/2018   T score -2.1 FRAX 3.7% / 0.5%  . Pancreatitis, gallstone   . PONV (postoperative nausea and vomiting)    NAUSEA AFTER KNEE ARTHROSCOPY   . Stroke [facility])     Past Surgical History:  Procedure Laterality Date  . ANTERIOR  CERVICAL DECOMP/DISCECTOMY FUSION N/A 02/15/2017   Procedure: ACDF C4-5, removal of hardware and exploration of fusion C5-6;  Surgeon: Melina Schools, MD;  Location: [place];  Service: Orthopedics;  Laterality: N/A;  . APPENDECTOMY    . CATARACT EXTRACTION, BILATERAL  JANUARY AND FEBRUARY  . 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 CHOLECYSTECTOMY N/A 08/17/2015   Procedure: LAPAROSCOPIC CHOLECYSTECTOMY WITH INTRAOPERATIVE CHOLANGIOGRAM;  Surgeon: Coralie Keens, MD;  Location: [place];  Service: General;  Laterality: N/A;  . COLPOSCOPY    . HAND SURGERY     bilateral  . KNEE ARTHROSCOPY     rt  . NECK SURGERY    . PARTIAL KNEE ARTHROPLASTY Right 02/14/2019   Procedure: UNICOMPARTMENTAL KNEE;  Surgeon: Paralee Cancel, MD;  Location: [facility];  Service: Orthopedics;  Laterality: Right;  . PELVIC LAPAROSCOPY  1992/1996   endometriosis  . TUBAL LIGATION    . vaginal hysterectomy bilateral salpingo-oophorectomy  11/1996   TVH BSO endometriosis/leiomyomata     Social History:  The patient  reports that she has never smoked. She has never used smokeless tobacco. She reports that she does not drink alcohol or use drugs.   Family History:  The patient's family history includes Heart disease in her brother and mother; Seizures in her mother.    ROS:  Please see the history of present illness. All other systems are reviewed and  Negative to the above problem except as noted.    PHYSICAL EXAM: VS:  BP (!) 148/84   Pulse 84   Ht 5\' 4"  (1.626 m)   Wt 170 lb (77.1 kg)   BMI 29.18 kg/m   \  GEN: Overweight 68 year old in no acute distress. HEENT: normal  Neck: No JVD   No carotid bruits, or masses Cardiac: RRR; no murmurs, rubs, or gallops,no edema  Respiratory:  clear to auscultation bilaterally, normal work of breathing GI: soft, nontender, nondistended, + BS  No hepatomegaly  MS: no deformity Moving all extremities   Skin: warm and dry, no rash Neuro:  Strength and  sensation are intact Psych: euthymic mood, full affect   EKG:  EKG is  ordered today.  Sinus rhythm 84 bpm.  Lipid Panel    Component Value Date/Time   CHOL 149 01/30/2018 0830   TRIG 100 01/30/2018 0830   TRIG 153 (H) 08/14/2017 0822   HDL 62 01/30/2018 0830   HDL 54 08/14/2017 0822   CHOLHDL 2.4 01/30/2018 0830   CHOLHDL 3.4 06/18/2016 0447   VLDL 24 06/18/2016 0447   LDLCALC 67 01/30/2018 0830  Wt Readings from Last 3 Encounters:  11/29/19 170 lb (77.1 kg)  06/26/19 163 lb (73.9 kg)  02/14/19 143 lb (64.9 kg)      ASSESSMENT AND PLAN:  1  HTN blood pressure is better at home.  Keep on same medicines.  I encouraged her to bring her cuff to get it calibrated once or twice a year.    2  HL  Pt with Hx TIA and mild plaquing we will check lipids today.  Continue Crestor.  3  Hx TIA  On plavix   Asypmtomatic check CBC today.  Follow-up 1 year.  Current medicines are reviewed at length with the patient today.  The patient does not have concerns regarding medicines.  Signed, Dietrich PatesPaula Chaeli Judy, MD  11/29/2019 3:49 PM    [facility] [address], [place], [place]  [address] Phone: [phone]; Fax: [phone]

## 2019-11-29 ENCOUNTER — Ambulatory Visit (INDEPENDENT_AMBULATORY_CARE_PROVIDER_SITE_OTHER): Payer: Medicare Other | Admitting: Internal Medicine

## 2019-11-29 ENCOUNTER — Other Ambulatory Visit: Payer: Self-pay

## 2019-11-29 ENCOUNTER — Encounter: Payer: Self-pay | Admitting: Internal Medicine

## 2019-11-29 VITALS — BP 148/84 | HR 84 | Ht 64.0 in | Wt 170.0 lb

## 2019-11-29 DIAGNOSIS — I10 Essential (primary) hypertension: Secondary | ICD-10-CM | POA: Diagnosis not present

## 2019-11-29 DIAGNOSIS — E785 Hyperlipidemia, unspecified: Secondary | ICD-10-CM | POA: Diagnosis not present

## 2019-11-29 NOTE — Patient Instructions (Signed)
Keep on same medicines  Will get labs today (CBC, Lipids)  Plan for f/u in 1 year

## 2019-11-30 LAB — CBC
Hematocrit: 37 % (ref 34.0–46.6)
Hemoglobin: 11.9 g/dL (ref 11.1–15.9)
MCH: 27.8 pg (ref 26.6–33.0)
MCHC: 32.2 g/dL (ref 31.5–35.7)
MCV: 86 fL (ref 79–97)
Platelets: 375 10*3/uL (ref 150–450)
RBC: 4.28 x10E6/uL (ref 3.77–5.28)
RDW: 12.6 % (ref 11.7–15.4)
WBC: 6.8 10*3/uL (ref 3.4–10.8)

## 2019-11-30 LAB — LIPID PANEL
Chol/HDL Ratio: 2.9 ratio (ref 0.0–4.4)
Cholesterol, Total: 180 mg/dL (ref 100–199)
HDL: 62 mg/dL (ref >39)
LDL Chol Calc (NIH): 96 mg/dL (ref 0–99)
Triglycerides: 125 mg/dL (ref 0–149)
VLDL Cholesterol Cal: 22 mg/dL (ref 5–40)

## 2019-12-03 ENCOUNTER — Encounter: Payer: Self-pay | Admitting: *Deleted

## 2019-12-18 ENCOUNTER — Telehealth: Payer: Self-pay | Admitting: Internal Medicine

## 2019-12-18 DIAGNOSIS — E785 Hyperlipidemia, unspecified: Secondary | ICD-10-CM

## 2019-12-18 MED ORDER — EZETIMIBE 10 MG PO TABS: 10.0000 mg | ORAL_TABLET | Freq: Every day | ORAL | 3 refills | Status: DC

## 2019-12-18 MED ORDER — PRAVASTATIN SODIUM 80 MG PO TABS: 80.0000 mg | ORAL_TABLET | Freq: Every evening | ORAL | 3 refills | Status: DC

## 2019-12-18 NOTE — Telephone Encounter (Signed)
-----   Message from Dorris Carnes V, MD sent at 12/12/2019  7:09 PM EST ----- Patient has mild carotid plaquing   Would like LDL lower  Increae pravastatin to 80 mg   Add Ztia 10 mg    F/U lipid panel in 8 wks with AST

## 2019-12-18 NOTE — Telephone Encounter (Signed)
Pt has been notified pf lab results and new recommendations per Dr. Harrington Challenger. Pt is agreeable to increase Pravastatin to 80 mg daily, new Rx has been sent, agreeable to start Zetia 10 mg daily, New Rx has been sent, Fasting lipid and AST to be done 8 weeks 02/14/20. Pt thanked me for the call and the help. Patient notified of result.  Please refer to phone note from today for complete details.   Julaine Hua, [place] 12/18/2019 12:19 PM

## 2019-12-18 NOTE — Addendum Note (Signed)
Addended by: Michae Kava on: 12/18/2019 12:25 PM   Modules accepted: Orders

## 2019-12-18 NOTE — Telephone Encounter (Signed)
Patient returning call for lab results. 

## 2020-01-28 ENCOUNTER — Encounter: Payer: Self-pay | Admitting: Family Medicine

## 2020-02-14 ENCOUNTER — Other Ambulatory Visit: Payer: Medicare Other

## 2020-03-16 ENCOUNTER — Other Ambulatory Visit: Payer: Self-pay | Admitting: Family Medicine

## 2020-03-16 DIAGNOSIS — Z1231 Encounter for screening mammogram for malignant neoplasm of breast: Secondary | ICD-10-CM

## 2020-03-27 ENCOUNTER — Other Ambulatory Visit: Payer: Self-pay

## 2020-03-27 ENCOUNTER — Ambulatory Visit
Admission: RE | Admit: 2020-03-27 | Discharge: 2020-03-27 | Disposition: A | Discharge status: Home / Self Care | Payer: Medicare PPO | Source: Ambulatory Visit | Attending: Family Medicine | Admitting: Family Medicine

## 2020-03-27 DIAGNOSIS — Z1231 Encounter for screening mammogram for malignant neoplasm of breast: Secondary | ICD-10-CM

## 2020-05-21 ENCOUNTER — Other Ambulatory Visit: Payer: Self-pay | Admitting: Internal Medicine

## 2020-06-26 ENCOUNTER — Ambulatory Visit (INDEPENDENT_AMBULATORY_CARE_PROVIDER_SITE_OTHER): Payer: Medicare PPO | Admitting: Obstetrics and Gynecology

## 2020-06-26 ENCOUNTER — Encounter: Payer: Self-pay | Admitting: Obstetrics and Gynecology

## 2020-06-26 ENCOUNTER — Other Ambulatory Visit: Payer: Self-pay

## 2020-06-26 ENCOUNTER — Encounter: Payer: Medicare Other | Admitting: Gynecology

## 2020-06-26 VITALS — BP 124/80 | Ht 64.0 in | Wt 174.0 lb

## 2020-06-26 DIAGNOSIS — Z01411 Encounter for gynecological examination (general) (routine) with abnormal findings: Secondary | ICD-10-CM

## 2020-06-26 DIAGNOSIS — M8580 Other specified disorders of bone density and structure, unspecified site: Secondary | ICD-10-CM

## 2020-06-26 NOTE — Progress Notes (Signed)
Rhonda Norris Apr 20, 1951 [id number]  SUBJECTIVE:  69 y.o. G3P3 female here for an annual routine gynecologic exam and Pap smear. She has no gynecologic concerns.  Current Outpatient Medications  Medication Sig Dispense Refill  . acidophilus (RISAQUAD) CAPS capsule Take 1 capsule by mouth daily.    Marland Kitchen amLODipine (NORVASC) 5 MG tablet TAKE 1 TABLET BY MOUTH ONCE DAILY **KEEP  APPT  IN  DECEMBER  WITH  DR.  ROSS  FOR  FUTURE  REFILLS** 90 tablet 1  . Ascorbic Acid (VITAMIN C) 500 MG CAPS Take 500 mg by mouth daily as needed (cold symptoms).     Marland Kitchen BIOTIN PO Take 1 tablet by mouth daily.    . Calcium Carbonate-Vitamin D (CALCIUM 600+D) 600-200 MG-UNIT TABS Take 1 tablet by mouth daily.     Marland Kitchen CINNAMON PO Take 1 capsule by mouth daily.    . clopidogrel (PLAVIX) 75 MG tablet Take 1 tablet (75 mg total) by mouth daily. 30 tablet 3  . Coenzyme Q10 (COQ10) 400 MG CAPS Take 1 capsule by mouth daily.    . Cranberry 250 MG CAPS Take 250 mg by mouth daily as needed (for UTI prevention).     . diphenhydrAMINE (BENADRYL) 25 MG tablet Take 25 mg by mouth every 6 (six) hours as needed for allergies.    Marland Kitchen ezetimibe (ZETIA) 10 MG tablet Take 1 tablet (10 mg total) by mouth daily. 90 tablet 3  . ferrous sulfate 325 (65 FE) MG tablet Take 1 tablet (325 mg total) by mouth 2 (two) times daily with a meal. 28 tablet 3  . Ginger, Zingiber officinalis, (GINGER ROOT PO) Take 1 capsule by mouth daily.    . Ginkgo Biloba 120 MG CAPS Take 120 mg by mouth daily.    . Glucosamine HCl 1500 MG TABS Take 1,500 mg by mouth daily.     Marland Kitchen HYDROcodone-acetaminophen (NORCO) 7.5-325 MG tablet Take 1-2 tablets by mouth every 4 (four) hours as needed for severe pain (pain score 7-10). 60 tablet 0  . loratadine (CLARITIN) 10 MG tablet Take 10 mg by mouth daily.    . Melatonin 3 MG TABS Take 3 mg by mouth at bedtime as needed (for sleep).     . metFORMIN (GLUCOPHAGE) 500 MG tablet Take 500 mg by mouth 2 (two) times daily with  a meal.     . mometasone (NASONEX) 50 MCG/ACT nasal spray Place 50 mcg into the nose daily as needed (congestion).     . Multiple Vitamin (MULTIVITAMIN WITH MINERALS) TABS tablet Take 1 tablet by mouth daily.    . Multiple Vitamins-Minerals (HAIR SKIN & NAILS ADVANCED) TABS Take 1 tablet by mouth daily.    . nortriptyline (PAMELOR) 25 MG capsule Take 25 mg by mouth at bedtime.     . Omega-3 Fatty Acids (FISH OIL) 1000 MG CAPS Take 1,000 mg by mouth daily.    . polyethylene glycol (MIRALAX / GLYCOLAX) packet Take 17 g by mouth daily as needed for moderate constipation.     . pravastatin (PRAVACHOL) 80 MG tablet Take 1 tablet (80 mg total) by mouth every evening. 90 tablet 3  . TURMERIC PO Take 1 tablet by mouth daily.    Marland Kitchen VITAMIN D PO Take 1 capsule by mouth daily.    . vitamin E 100 UNIT capsule Take 100 Units by mouth daily.      No current facility-administered medications for this visit.   Allergies: Dexilant [dexlansoprazole], Sulfa antibiotics, Aspirin, Caffeine, Rosuvastatin,  Sulfanilamide, and Methocarbamol  No LMP recorded. Patient has had a hysterectomy.  Past medical history,surgical history, problem list, medications, allergies, family history and social history were all reviewed and documented as reviewed in the EPIC chart.  ROS:  Feeling well. No dyspnea or chest pain on exertion.  No abdominal pain, change in bowel habits, black or bloody stools.  No urinary tract symptoms. GYN ROS: no abnormal bleeding, pelvic pain or discharge, no breast pain or new or enlarging lumps on self exam. No neurological complaints.   OBJECTIVE:  Ht 5\' 4"  (1.626 m)   Wt 174 lb (78.9 kg)   BMI 29.87 kg/m  The patient appears well, alert, oriented x 3, in no distress. ENT normal.  Neck supple. No cervical or supraclavicular adenopathy or thyromegaly.  Lungs are clear, good air entry, no wheezes, rhonchi or rales. S1 and S2 normal, no murmurs, regular rate and rhythm.  Abdomen soft without  tenderness, guarding, mass or organomegaly.  Neurological is normal, no focal findings.  BREAST EXAM: breasts appear normal, no suspicious masses, no skin or nipple changes or axillary nodes  PELVIC EXAM: VULVA: normal appearing vulva with no masses, tenderness or lesions, VAGINA: normal appearing vagina with normal color and discharge, no lesions, CERVIX: surgically absent, UTERUS: surgically absent, vaginal cuff normal, ADNEXA: no masses, non tender  Chaperone: present during the examination  ASSESSMENT:  69 y.o. G3P3 here for annual gynecologic exam  PLAN:   1. Postmenopausal. Prior TVH BSO 1997 for leiomyoma and endometriosis.  2. Pap smear 03/2018.  No significant history of abnormal Pap smears.  Next Pap smear due 2022 following the current guidelines recommending the 3 year interval, . 3. Mammogram 03/2020.  Normal breast exam today.  Continue with annual mammograms. 4. Colonoscopy 2014.  Recommended that she follow up at the recommended interval.   5. Osteopenia. DEXA 03/2018.  T-score -2.1, FRAX 3.7%/0.5%.  Next DEXA recommended now so she will plan to schedule. 6. Health maintenance.  No labs today as she normally has these completed elsewhere.  Return annually or sooner, prn.  04/2018 MD 06/26/20

## 2020-07-28 ENCOUNTER — Other Ambulatory Visit: Payer: Self-pay

## 2020-07-28 ENCOUNTER — Ambulatory Visit (INDEPENDENT_AMBULATORY_CARE_PROVIDER_SITE_OTHER): Payer: Medicare PPO

## 2020-07-28 ENCOUNTER — Other Ambulatory Visit: Payer: Self-pay | Admitting: Obstetrics and Gynecology

## 2020-07-28 ENCOUNTER — Other Ambulatory Visit: Payer: Self-pay | Admitting: Obstetrics & Gynecology

## 2020-07-28 DIAGNOSIS — Z780 Asymptomatic menopausal state: Secondary | ICD-10-CM

## 2020-07-28 DIAGNOSIS — M8589 Other specified disorders of bone density and structure, multiple sites: Secondary | ICD-10-CM | POA: Diagnosis not present

## 2020-07-28 DIAGNOSIS — Z01411 Encounter for gynecological examination (general) (routine) with abnormal findings: Secondary | ICD-10-CM

## 2020-07-28 DIAGNOSIS — M8580 Other specified disorders of bone density and structure, unspecified site: Secondary | ICD-10-CM

## 2020-09-29 ENCOUNTER — Telehealth: Payer: Self-pay | Admitting: Internal Medicine

## 2020-09-29 NOTE — Telephone Encounter (Signed)
I s/w the pt and stated I will I need further information to give the cardiologist. I asked who her dentist was doing the procedure. Pt answered Dr. Sharma Covert [phone] with [facility]. I called the dental office and asked if they could either verbally give the clearance information needed or fax over a clearance form. Woman at [facility] office states the pt was only inquiring if ok to have this procedure done. I was told that the pt may be having a dental implant either with Dr. Sharma Covert or may be referred out to an oral surgeon's office. I stated that we will then wait for a clearance form to come from either Dr. Kirstie Peri office or oral surgeon's office.

## 2020-09-29 NOTE — Telephone Encounter (Signed)
New Message:     Pt wants to know if it is alright for her to have a dental implant with her condition?t

## 2021-01-01 ENCOUNTER — Ambulatory Visit (INDEPENDENT_AMBULATORY_CARE_PROVIDER_SITE_OTHER): Payer: Medicare PPO | Admitting: Internal Medicine

## 2021-01-01 ENCOUNTER — Encounter: Payer: Self-pay | Admitting: Internal Medicine

## 2021-01-01 ENCOUNTER — Other Ambulatory Visit: Payer: Self-pay

## 2021-01-01 VITALS — BP 140/92 | HR 83 | Ht 64.0 in | Wt 174.8 lb

## 2021-01-01 DIAGNOSIS — I10 Essential (primary) hypertension: Secondary | ICD-10-CM

## 2021-01-01 DIAGNOSIS — E785 Hyperlipidemia, unspecified: Secondary | ICD-10-CM | POA: Diagnosis not present

## 2021-01-01 NOTE — Progress Notes (Signed)
Cardiology Office Note   Date:  01/01/2021   ID:  Elton Sin Shackleton, DOB 06-Feb-1951, MRN [id number]  PCP:  Catha Gosselin, MD  Cardiologist:  New    F/U of dyspnea  History of Present Illness: Rhonda Norris is a 70 y.o. female with a history of HL, Type 2 DM, TIA fs seizure(2017), SOB, achiness  She is folowed by K Little  Hx of achiness and SOB on simvistatin    I saw the pt in Dec 2020  The pt says she is doing OK   Breathing is stable  Does get SOB with some activities   No CP    BP at home is better.  Actually better at other doctor's visits Current Meds  Medication Sig  . acidophilus (RISAQUAD) CAPS capsule Take 1 capsule by mouth daily.  Marland Kitchen amLODipine (NORVASC) 5 MG tablet TAKE 1 TABLET BY MOUTH ONCE DAILY **KEEP  APPT  IN  DECEMBER  WITH  DR.  Donta Mcinroy  FOR  FUTURE  REFILLS**  . amoxicillin (AMOXIL) 500 MG tablet amoxicillin 500 mg tablet  Take 4 tablets 1 hour prior to dental appt.  . Ascorbic Acid (VITAMIN C) 500 MG CAPS Take 500 mg by mouth daily as needed (cold symptoms).   Marland Kitchen BIOTIN PO Take 1 tablet by mouth daily.  . Calcium Carbonate-Vitamin D 600-200 MG-UNIT TABS Take 1 tablet by mouth daily.   . Cholecalciferol (VITAMIN D) 50 MCG (2000 UT) CAPS 1 capsule  . CINNAMON PO Take 1 capsule by mouth daily.  . clopidogrel (PLAVIX) 75 MG tablet Take 1 tablet (75 mg total) by mouth daily.  . Coenzyme Q10 (COQ10) 400 MG CAPS Take 1 capsule by mouth daily.  . Cranberry 250 MG CAPS Take 250 mg by mouth daily as needed (for UTI prevention).   . diphenhydrAMINE (BENADRYL) 25 MG tablet Take 25 mg by mouth every 6 (six) hours as needed for allergies.  Marland Kitchen ezetimibe (ZETIA) 10 MG tablet Take 1 tablet (10 mg total) by mouth daily.  . famotidine (PEPCID) 20 MG tablet 1 tablet at bedtime as needed  . fluticasone (FLONASE) 50 MCG/ACT nasal spray 1 spray in each nostril  . Ginger, Zingiber officinalis, (GINGER ROOT PO) Take 1 capsule by mouth daily.  . Ginkgo Biloba 120 MG  CAPS Take 120 mg by mouth daily.  . Glucosamine HCl 1500 MG TABS Take 1,500 mg by mouth daily.   Marland Kitchen ibuprofen (ADVIL) 200 MG tablet 1 tablet as needed  . Lactobacillus (ACIDOPHILUS) CAPS capsule 1 capsule  . loratadine (CLARITIN) 10 MG tablet Take 10 mg by mouth daily.  Marland Kitchen losartan (COZAAR) 50 MG tablet Take 50 mg by mouth daily.  . Melatonin 3 MG TABS Take 3 mg by mouth at bedtime as needed (for sleep).   . metFORMIN (GLUCOPHAGE) 500 MG tablet Take 500 mg by mouth 2 (two) times daily with a meal.   . Misc Natural Products (TURMERIC CURCUMIN) CAPS 1 capsule  . mometasone (NASONEX) 50 MCG/ACT nasal spray Place 50 mcg into the nose daily as needed (congestion).   . Multiple Vitamin (MULTIVITAMIN WITH MINERALS) TABS tablet Take 1 tablet by mouth daily.  . Multiple Vitamins-Minerals (HAIR SKIN & NAILS ADVANCED) TABS Take 1 tablet by mouth daily.  . nortriptyline (PAMELOR) 25 MG capsule Take 25 mg by mouth at bedtime.   . Omega-3 Fatty Acids (FISH OIL) 1000 MG CAPS Take 1,000 mg by mouth daily.  . polyethylene glycol (MIRALAX / GLYCOLAX) packet Take 17  g by mouth daily as needed for moderate constipation.   . pravastatin (PRAVACHOL) 80 MG tablet Take 1 tablet (80 mg total) by mouth every evening.  . senna-docusate (SENOKOT-S) 8.6-50 MG tablet 1 tablet in the evening as needed  . TURMERIC PO Take 1 tablet by mouth daily.  Marland Kitchen VITAMIN D PO Take 1 capsule by mouth daily.  . vitamin E 100 UNIT capsule Take 100 Units by mouth daily.      Allergies:   Dexilant [dexlansoprazole], Sulfa antibiotics, Aspirin, Caffeine, Rosuvastatin, Sulfanilamide, and Methocarbamol   Past Medical History:  Diagnosis Date  . Allergic rhinitis   . Arthritis   . Carpal tunnel syndrome   . Diabetes mellitus without complication ([facility]) 02/08/2017   ON METFORMIN BID   . Elevated cholesterol   . Fibromyalgia   . GERD (gastroesophageal reflux disease)   . HTN (hypertension)   . IBD (inflammatory bowel disease)   .  Osteopenia 03/2018   T score -2.1 FRAX 3.7% / 0.5%  . Pancreatitis, gallstone   . PONV (postoperative nausea and vomiting)    NAUSEA AFTER KNEE ARTHROSCOPY   . Stroke [facility])     Past Surgical History:  Procedure Laterality Date  . ANTERIOR CERVICAL DECOMP/DISCECTOMY FUSION N/A 02/15/2017   Procedure: ACDF C4-5, removal of hardware and exploration of fusion C5-6;  Surgeon: Venita Lick, MD;  Location: [facility];  Service: Orthopedics;  Laterality: N/A;  . APPENDECTOMY    . CATARACT EXTRACTION, BILATERAL  JANUARY AND FEBRUARY  . 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 CHOLECYSTECTOMY N/A 08/17/2015   Procedure: LAPAROSCOPIC CHOLECYSTECTOMY WITH INTRAOPERATIVE CHOLANGIOGRAM;  Surgeon: Abigail Miyamoto, MD;  Location: [facility];  Service: General;  Laterality: N/A;  . COLPOSCOPY    . HAND SURGERY     bilateral  . KNEE ARTHROSCOPY     rt  . NECK SURGERY    . PARTIAL KNEE ARTHROPLASTY Right 02/14/2019   Procedure: UNICOMPARTMENTAL KNEE;  Surgeon: Durene Romans, MD;  Location: [facility];  Service: Orthopedics;  Laterality: Right;  . PELVIC LAPAROSCOPY  1992/1996   endometriosis  . TUBAL LIGATION    . vaginal hysterectomy bilateral salpingo-oophorectomy  11/1996   TVH BSO endometriosis/leiomyomata     Social History:  The patient  reports that she has never smoked. She has never used smokeless tobacco. She reports that she does not drink alcohol and does not use drugs.   Family History:  The patient's family history includes Heart disease in her brother and mother; Seizures in her mother.    ROS:  Please see the history of present illness. All other systems are reviewed and  Negative to the above problem except as noted.    PHYSICAL EXAM: VS:  BP (!) 140/92   Pulse 83   Ht 5\' 4"  (1.626 m)   Wt 174 lb 12.8 oz (79.3 kg)   SpO2 95%   BMI 30.00 kg/m   \  GEN: Overweight 70 year old in no acute distress. HEENT: normal  Neck: No JVD   No carotid bruits Cardiac: RRR; no  murmurs.  No LE  edema  Respiratory:  clear to auscultation bilaterally,  GI: soft, nontender, nondistended, + BS  No hepatomegaly  MS: no deformity Moving all extremities   Skin: warm and dry, no rash Neuro:  Strength and sensation are intact Psych: euthymic mood, full affect   EKG:  EKG is  ordered today.  Sinus rhythm 83 bpm.  Lipid Panel  Component Value Date/Time   CHOL 180 11/29/2019 1602   TRIG 125 11/29/2019 1602   TRIG 153 (H) 08/14/2017 0822   HDL 62 11/29/2019 1602   HDL 54 08/14/2017 0822   CHOLHDL 2.9 11/29/2019 1602   CHOLHDL 3.4 06/18/2016 0447   VLDL 24 06/18/2016 0447   LDLCALC 96 11/29/2019 1602      Wt Readings from Last 3 Encounters:  01/01/21 174 lb 12.8 oz (79.3 kg)  06/26/20 174 lb (78.9 kg)  11/29/19 170 lb (77.1 kg)      ASSESSMENT AND PLAN:  1  HTN Encouraged her to keep track at home   Also, recomm she take cuff to her next appt to make sure accurate  2  HL  Pt will return for fasting lipids   3  Hx TIA  On plavix   Asypmtomatic Will check CBC  Follow-up 1 year.  Current medicines are reviewed at length with the patient today.  The patient does not have concerns regarding medicines.  Signed, Dietrich Pates, MD  01/01/2021 4:54 PM    [facility] [address] [place], [place], [place]  [address] Phone: [phone]; Fax: [phone]

## 2021-01-01 NOTE — Patient Instructions (Signed)
Medication Instructions:  No changes *If you need a refill on your cardiac medications before your next appointment, please call your pharmacy*   Lab Work: Please return for fasting labs : LIPIDS/LIVER/BMET/CBC   Testing/Procedures: none   Follow-Up: At [facility], you and your health needs are our priority.  As part of our continuing mission to provide you with exceptional heart care, we have created designated Provider Care Teams.  These Care Teams include your primary Cardiologist (physician) and Advanced Practice Providers (APPs -  Physician Assistants and Nurse Practitioners) who all work together to provide you with the care you need, when you need it.  We recommend signing up for the patient portal called "MyChart".  Sign up information is provided on this After Visit Summary.  MyChart is used to connect with patients for Virtual Visits (Telemedicine).  Patients are able to view lab/test results, encounter notes, upcoming appointments, etc.  Non-urgent messages can be sent to your provider as well.   To learn more about what you can do with MyChart, go to [email or web address].    Your next appointment:   12 month(s)  The format for your next appointment:   In Person  Provider:   You may see Dietrich Pates, MD or one of the following Advanced Practice Providers on your designated Care Team:    Tereso Newcomer, PA-C  Chelsea Aus, [place]   Other Instructions

## 2021-01-04 ENCOUNTER — Ambulatory Visit: Payer: Medicare PPO | Admitting: Internal Medicine

## 2021-01-11 ENCOUNTER — Other Ambulatory Visit: Payer: Self-pay

## 2021-01-11 ENCOUNTER — Other Ambulatory Visit: Payer: Medicare PPO | Admitting: *Deleted

## 2021-01-11 DIAGNOSIS — I10 Essential (primary) hypertension: Secondary | ICD-10-CM

## 2021-01-11 DIAGNOSIS — E785 Hyperlipidemia, unspecified: Secondary | ICD-10-CM | POA: Diagnosis not present

## 2021-01-11 LAB — LIPID PANEL
Chol/HDL Ratio: 2.6 ratio (ref 0.0–4.4)
Cholesterol, Total: 163 mg/dL (ref 100–199)
HDL: 63 mg/dL (ref >39)
LDL Chol Calc (NIH): 84 mg/dL (ref 0–99)
Triglycerides: 87 mg/dL (ref 0–149)
VLDL Cholesterol Cal: 16 mg/dL (ref 5–40)

## 2021-01-11 LAB — BASIC METABOLIC PANEL
BUN/Creatinine Ratio: 20 (ref 12–28)
BUN: 16 mg/dL (ref 8–27)
CO2: 25 mmol/L (ref 20–29)
Calcium: 9.9 mg/dL (ref 8.7–10.3)
Chloride: 99 mmol/L (ref 96–106)
Creatinine, Ser: 0.79 mg/dL (ref 0.57–1.00)
GFR calc Af Amer: 88 mL/min/{1.73_m2} (ref >59)
GFR calc non Af Amer: 77 mL/min/{1.73_m2} (ref >59)
Glucose: 113 mg/dL — ABNORMAL HIGH (ref 65–99)
Potassium: 4.5 mmol/L (ref 3.5–5.2)
Sodium: 138 mmol/L (ref 134–144)

## 2021-01-11 LAB — HEPATIC FUNCTION PANEL
ALT: 28 IU/L (ref 0–32)
AST: 23 IU/L (ref 0–40)
Albumin: 4.7 g/dL (ref 3.8–4.8)
Alkaline Phosphatase: 99 IU/L (ref 44–121)
Bilirubin Total: 0.3 mg/dL (ref 0.0–1.2)
Bilirubin, Direct: <0.1 mg/dL (ref 0.00–0.40)
Total Protein: 7.2 g/dL (ref 6.0–8.5)

## 2021-01-11 LAB — CBC
Hematocrit: 37.1 % (ref 34.0–46.6)
Hemoglobin: 12 g/dL (ref 11.1–15.9)
MCH: 28 pg (ref 26.6–33.0)
MCHC: 32.3 g/dL (ref 31.5–35.7)
MCV: 87 fL (ref 79–97)
Platelets: 399 10*3/uL (ref 150–450)
RBC: 4.29 x10E6/uL (ref 3.77–5.28)
RDW: 12.4 % (ref 11.7–15.4)
WBC: 7.9 10*3/uL (ref 3.4–10.8)

## 2021-01-23 ENCOUNTER — Other Ambulatory Visit: Payer: Self-pay | Admitting: Internal Medicine

## 2021-02-17 ENCOUNTER — Telehealth: Payer: Self-pay | Admitting: *Deleted

## 2021-02-17 NOTE — Telephone Encounter (Signed)
I called the pt in regards to a  Walk-in form was dropped off to me today for pre op clearance for dental work. I did inform the pt that the dental office will need to send over a clearance request. Pt said she will have dental office call us.

## 2021-02-18 NOTE — Telephone Encounter (Addendum)
Katy from [facility] states the patient is trying to decide between an implant and a bridge. She states Dr. Delane Ginger does not require the patient to come off her blood thinner, but the patient would like to make sure it is okay with Dr. Tenny Craw. She states they are not requiring clearance the patient would just like an okay from Dr. Tenny Craw. She states Dr. Delane Ginger would like to speak with Dr. Tenny Craw and will follow whatever she recommends. She states she thinks it will be a short 5 minute conversation. She states he is in the office today if he can be called. His personal number is: [phone]. Office number: [phone].

## 2021-02-18 NOTE — Telephone Encounter (Signed)
I will forward this call to Dr. Tenny Craw and her nurse to follow up as the requesting office is not asking for clearance. See note from the operator.

## 2021-02-23 NOTE — Telephone Encounter (Signed)
Spoke to Dr Delane Ginger    He did not think Plavix needed to be held for planned procedure   Minimal risk of bleeding    OK, If needed, Would have been OK to hold Resume after,   But given TIA hx OK to continue as well

## 2021-03-10 ENCOUNTER — Telehealth: Payer: Self-pay | Admitting: *Deleted

## 2021-03-10 NOTE — Telephone Encounter (Signed)
Pt is on plavix for neuro reason (? TIA/CVA/seizure)  Not cardiac    She is allergic to ASA it looks like No cardiac reason she cannot hold   Neuro or PCP need to confirm other  reasons.

## 2021-03-10 NOTE — Telephone Encounter (Signed)
Rhonda Norris 70 year old female is requesting preoperative cardiac evaluation for endoscopy procedure.  She was last seen in the clinic on 01/01/2021.  During that time she was doing okay.  Her breathing was stable.  She denied chest pain.  It was recommended that she take her home blood pressure cuff to her next appointment to confirm accuracy.  Her PMH includes hyperlipidemia, type 2 diabetes, TIA, shortness of breath, and achiness.  May her Plavix be held prior to her procedure?  Thank you for your help.  Please direct response to CV DIV preop pool.  Thomasene Ripple. Cleaver NP-C    03/10/2021, 10:15 AM [facility] [address] Office [phone] Fax [phone]

## 2021-03-10 NOTE — Telephone Encounter (Signed)
   [facility] Pre-operative Risk Assessment    HEARTCARE STAFF: - Please ensure there is not already an duplicate clearance open for this procedure. - Under Visit Info/Reason for Call, type in Other and utilize the format Clearance MM/DD/YY or Clearance TBD. Do not use dashes or single digits. - If request is for dental extraction, please clarify the # of teeth to be extracted.  Request for surgical clearance:  1. What type of surgery is being performed? ENDOSCOPY  2. When is this surgery scheduled? 04/15/21   3. What type of clearance is required (medical clearance vs. Pharmacy clearance to hold med vs. Both)? MEDICAL  4. Are there any medications that need to be held prior to surgery and how long? PLAVIX x 5 DAYS PRIOR    5. Practice name and name of physician performing surgery? [facility]; DR. Michail Sermon   6. What is the office phone number? [phone]   7.   What is the office fax number? [phone]  8.   Anesthesia type (None, local, MAC, general) ? NOT LISTED   Julaine Hua 03/10/2021, 9:58 AM  _________________________________________________________________   (provider comments below)

## 2021-03-12 NOTE — Telephone Encounter (Signed)
   Primary Cardiologist: Dietrich Pates, MD  Chart reviewed as part of pre-operative protocol coverage. Patient was contacted 03/12/2021 in reference to pre-operative risk assessment for pending surgery as outlined below.  Rhonda Norris was last seen on 01/01/2021 by Dr. Tenny Craw.  Since that day, [name] has done well without any chest pain or shortness of breath.  Therefore, based on ACC/AHA guidelines, the patient would be at acceptable risk for the planned procedure without further cardiovascular testing.   Please check with the patient's PCP Dr. Catha Gosselin regarding how long to hold Plavix.  Patient was prescribed Plavix after her admission for TIA in July 2017.  She is not taking Plavix for any cardiac reason.  Plavix is currently managed by Dr. Catha Gosselin.  The patient was advised that if she develops new symptoms prior to surgery to contact our office to arrange for a follow-up visit, and she verbalized understanding.  I will route this recommendation to the requesting party via Epic fax function and remove from pre-op pool. Please call with questions.  Rhonda Norris, [name] 03/12/2021, 4:54 PM

## 2021-04-05 ENCOUNTER — Other Ambulatory Visit: Payer: Self-pay | Admitting: Internal Medicine

## 2021-04-05 DIAGNOSIS — E785 Hyperlipidemia, unspecified: Secondary | ICD-10-CM

## 2021-04-21 ENCOUNTER — Other Ambulatory Visit: Payer: Self-pay | Admitting: Family Medicine

## 2021-04-21 DIAGNOSIS — Z1231 Encounter for screening mammogram for malignant neoplasm of breast: Secondary | ICD-10-CM

## 2021-05-10 DIAGNOSIS — R1310 Dysphagia, unspecified: Secondary | ICD-10-CM | POA: Diagnosis not present

## 2021-05-10 DIAGNOSIS — H524 Presbyopia: Secondary | ICD-10-CM | POA: Diagnosis not present

## 2021-05-10 DIAGNOSIS — E119 Type 2 diabetes mellitus without complications: Secondary | ICD-10-CM | POA: Diagnosis not present

## 2021-05-10 DIAGNOSIS — K219 Gastro-esophageal reflux disease without esophagitis: Secondary | ICD-10-CM | POA: Diagnosis not present

## 2021-05-10 DIAGNOSIS — Z86010 Personal history of colonic polyps: Secondary | ICD-10-CM | POA: Diagnosis not present

## 2021-05-18 ENCOUNTER — Telehealth: Payer: Self-pay | Admitting: Internal Medicine

## 2021-05-18 NOTE — Telephone Encounter (Signed)
   [facility] Pre-operative Risk Assessment    Request for surgical clearance:  1. What type of surgery is being performed? Implant and extraction    2. When is this surgery scheduled? TBD  3. What type of clearance is required (medical clearance vs. Pharmacy clearance to hold med vs. Both)? both  4. Are there any medications that need to be held prior to surgery and how long? Blood thinner medication (needs to know if she needs to be off of it prior to surgery)  5. Practice name and name of physician performing surgery? Dr Geanie Cooley or Dr. Earvin Hansen  6. What is your office phone number [phone] Joellen Jersey)   7.   What is your office fax number [phone]  8.   Anesthesia type (None, local, MAC, general) ? Local    Shawana A Smith 05/18/2021, 3:28 PM  _________________________________________________________________   (provider comments below)

## 2021-05-18 NOTE — Telephone Encounter (Signed)
   Patient Name: Rhonda Norris  DOB: 12/29/1950  MRN: [id number]   Primary Cardiologist: Dietrich Pates, MD  Chart reviewed as part of pre-operative protocol coverage.   Simple dental extractions and procedures such as implantation are considered low risk procedures per guidelines and generally do not require any specific cardiac clearance. It is also generally accepted that for simple extractions/implantations, and dental cleanings, there is no need to interrupt blood thinner therapy.  SBE prophylaxis is not required for the patient from a cardiac standpoint.  I will route this recommendation to the requesting party via Epic fax function and remove from pre-op pool.  Please call with questions.  Beatriz Stallion, PA-C 05/18/2021, 3:44 PM

## 2021-06-15 ENCOUNTER — Other Ambulatory Visit: Payer: Self-pay

## 2021-06-15 ENCOUNTER — Ambulatory Visit
Admission: RE | Admit: 2021-06-15 | Discharge: 2021-06-15 | Disposition: A | Discharge status: Home / Self Care | Payer: Medicare PPO | Source: Ambulatory Visit | Attending: Family Medicine | Admitting: Family Medicine

## 2021-06-15 DIAGNOSIS — Z1231 Encounter for screening mammogram for malignant neoplasm of breast: Secondary | ICD-10-CM | POA: Diagnosis not present

## 2021-06-30 ENCOUNTER — Encounter: Payer: Medicare PPO | Admitting: Obstetrics and Gynecology

## 2021-08-20 DIAGNOSIS — E1149 Type 2 diabetes mellitus with other diabetic neurological complication: Secondary | ICD-10-CM | POA: Diagnosis not present

## 2021-08-20 DIAGNOSIS — Z0000 Encounter for general adult medical examination without abnormal findings: Secondary | ICD-10-CM | POA: Diagnosis not present

## 2021-08-20 DIAGNOSIS — E7800 Pure hypercholesterolemia, unspecified: Secondary | ICD-10-CM | POA: Diagnosis not present

## 2021-08-20 DIAGNOSIS — Z8673 Personal history of transient ischemic attack (TIA), and cerebral infarction without residual deficits: Secondary | ICD-10-CM | POA: Diagnosis not present

## 2021-08-20 DIAGNOSIS — K219 Gastro-esophageal reflux disease without esophagitis: Secondary | ICD-10-CM | POA: Diagnosis not present

## 2021-08-20 DIAGNOSIS — I10 Essential (primary) hypertension: Secondary | ICD-10-CM | POA: Diagnosis not present

## 2021-08-20 DIAGNOSIS — M489 Spondylopathy, unspecified: Secondary | ICD-10-CM | POA: Diagnosis not present

## 2021-08-20 DIAGNOSIS — M8580 Other specified disorders of bone density and structure, unspecified site: Secondary | ICD-10-CM | POA: Diagnosis not present

## 2021-08-20 DIAGNOSIS — M797 Fibromyalgia: Secondary | ICD-10-CM | POA: Diagnosis not present

## 2021-08-20 DIAGNOSIS — Z1389 Encounter for screening for other disorder: Secondary | ICD-10-CM | POA: Diagnosis not present

## 2021-09-23 DIAGNOSIS — E7800 Pure hypercholesterolemia, unspecified: Secondary | ICD-10-CM | POA: Diagnosis not present

## 2021-09-23 DIAGNOSIS — Z23 Encounter for immunization: Secondary | ICD-10-CM | POA: Diagnosis not present

## 2021-09-23 DIAGNOSIS — I10 Essential (primary) hypertension: Secondary | ICD-10-CM | POA: Diagnosis not present

## 2021-09-23 DIAGNOSIS — E1149 Type 2 diabetes mellitus with other diabetic neurological complication: Secondary | ICD-10-CM | POA: Diagnosis not present

## 2021-10-19 DIAGNOSIS — K5900 Constipation, unspecified: Secondary | ICD-10-CM | POA: Diagnosis not present

## 2021-10-19 DIAGNOSIS — I10 Essential (primary) hypertension: Secondary | ICD-10-CM | POA: Diagnosis not present

## 2021-10-19 DIAGNOSIS — M1990 Unspecified osteoarthritis, unspecified site: Secondary | ICD-10-CM | POA: Diagnosis not present

## 2021-10-19 DIAGNOSIS — E1165 Type 2 diabetes mellitus with hyperglycemia: Secondary | ICD-10-CM | POA: Diagnosis not present

## 2021-10-19 DIAGNOSIS — J309 Allergic rhinitis, unspecified: Secondary | ICD-10-CM | POA: Diagnosis not present

## 2021-10-19 DIAGNOSIS — M797 Fibromyalgia: Secondary | ICD-10-CM | POA: Diagnosis not present

## 2021-10-19 DIAGNOSIS — E669 Obesity, unspecified: Secondary | ICD-10-CM | POA: Diagnosis not present

## 2021-10-19 DIAGNOSIS — E785 Hyperlipidemia, unspecified: Secondary | ICD-10-CM | POA: Diagnosis not present

## 2021-10-19 DIAGNOSIS — K219 Gastro-esophageal reflux disease without esophagitis: Secondary | ICD-10-CM | POA: Diagnosis not present

## 2021-11-01 ENCOUNTER — Other Ambulatory Visit: Payer: Self-pay | Admitting: Internal Medicine

## 2021-12-27 DIAGNOSIS — E7800 Pure hypercholesterolemia, unspecified: Secondary | ICD-10-CM | POA: Diagnosis not present

## 2021-12-27 DIAGNOSIS — M8580 Other specified disorders of bone density and structure, unspecified site: Secondary | ICD-10-CM | POA: Diagnosis not present

## 2021-12-27 DIAGNOSIS — K219 Gastro-esophageal reflux disease without esophagitis: Secondary | ICD-10-CM | POA: Diagnosis not present

## 2021-12-27 DIAGNOSIS — Z8673 Personal history of transient ischemic attack (TIA), and cerebral infarction without residual deficits: Secondary | ICD-10-CM | POA: Diagnosis not present

## 2021-12-27 DIAGNOSIS — E1149 Type 2 diabetes mellitus with other diabetic neurological complication: Secondary | ICD-10-CM | POA: Diagnosis not present

## 2021-12-27 DIAGNOSIS — I10 Essential (primary) hypertension: Secondary | ICD-10-CM | POA: Diagnosis not present

## 2021-12-27 DIAGNOSIS — Z6830 Body mass index (BMI) 30.0-30.9, adult: Secondary | ICD-10-CM | POA: Diagnosis not present

## 2022-01-11 ENCOUNTER — Other Ambulatory Visit: Payer: Self-pay | Admitting: Internal Medicine

## 2022-01-11 DIAGNOSIS — E785 Hyperlipidemia, unspecified: Secondary | ICD-10-CM

## 2022-04-27 DIAGNOSIS — E1149 Type 2 diabetes mellitus with other diabetic neurological complication: Secondary | ICD-10-CM | POA: Diagnosis not present

## 2022-04-29 ENCOUNTER — Other Ambulatory Visit: Payer: Self-pay | Admitting: Internal Medicine

## 2022-04-29 DIAGNOSIS — E785 Hyperlipidemia, unspecified: Secondary | ICD-10-CM

## 2022-05-11 DIAGNOSIS — H5213 Myopia, bilateral: Secondary | ICD-10-CM | POA: Diagnosis not present

## 2022-05-11 DIAGNOSIS — H52203 Unspecified astigmatism, bilateral: Secondary | ICD-10-CM | POA: Diagnosis not present

## 2022-05-11 DIAGNOSIS — E119 Type 2 diabetes mellitus without complications: Secondary | ICD-10-CM | POA: Diagnosis not present

## 2022-05-11 DIAGNOSIS — H10413 Chronic giant papillary conjunctivitis, bilateral: Secondary | ICD-10-CM | POA: Diagnosis not present

## 2022-06-14 ENCOUNTER — Encounter: Payer: Self-pay | Admitting: Internal Medicine

## 2022-06-14 ENCOUNTER — Ambulatory Visit (INDEPENDENT_AMBULATORY_CARE_PROVIDER_SITE_OTHER): Payer: Medicare PPO | Admitting: Internal Medicine

## 2022-06-14 VITALS — BP 134/80 | HR 79 | Ht 63.0 in | Wt 162.0 lb

## 2022-06-14 DIAGNOSIS — I10 Essential (primary) hypertension: Secondary | ICD-10-CM

## 2022-07-05 DIAGNOSIS — M8580 Other specified disorders of bone density and structure, unspecified site: Secondary | ICD-10-CM | POA: Diagnosis not present

## 2022-07-05 DIAGNOSIS — G459 Transient cerebral ischemic attack, unspecified: Secondary | ICD-10-CM | POA: Diagnosis not present

## 2022-07-05 DIAGNOSIS — I10 Essential (primary) hypertension: Secondary | ICD-10-CM | POA: Diagnosis not present

## 2022-07-05 DIAGNOSIS — E7800 Pure hypercholesterolemia, unspecified: Secondary | ICD-10-CM | POA: Diagnosis not present

## 2022-07-05 DIAGNOSIS — E1149 Type 2 diabetes mellitus with other diabetic neurological complication: Secondary | ICD-10-CM | POA: Diagnosis not present

## 2022-08-24 ENCOUNTER — Other Ambulatory Visit: Payer: Self-pay | Admitting: Family Medicine

## 2022-08-24 DIAGNOSIS — Z1389 Encounter for screening for other disorder: Secondary | ICD-10-CM | POA: Diagnosis not present

## 2022-08-24 DIAGNOSIS — Z23 Encounter for immunization: Secondary | ICD-10-CM | POA: Diagnosis not present

## 2022-08-24 DIAGNOSIS — Z0000 Encounter for general adult medical examination without abnormal findings: Secondary | ICD-10-CM | POA: Diagnosis not present

## 2022-08-24 DIAGNOSIS — M8580 Other specified disorders of bone density and structure, unspecified site: Secondary | ICD-10-CM

## 2022-09-28 ENCOUNTER — Telehealth: Payer: Self-pay | Admitting: Orthopaedic Surgery

## 2022-09-28 NOTE — Telephone Encounter (Signed)
Pt called requesting a letter stating she no longer need per meds for dental appt or procedures being done. Pt had surgery with dr Ninfa Linden several years ago. Please call pt when ready for pickup. Pt phone number is [phone].

## 2022-09-29 NOTE — Telephone Encounter (Signed)
LMOM for patient letting her know this was written, that she could come by here and we could print it off for her or she could call back with the fax# and we could send it for her

## 2022-10-06 DIAGNOSIS — M1812 Unilateral primary osteoarthritis of first carpometacarpal joint, left hand: Secondary | ICD-10-CM | POA: Diagnosis not present

## 2022-11-23 DIAGNOSIS — Z6828 Body mass index (BMI) 28.0-28.9, adult: Secondary | ICD-10-CM | POA: Diagnosis not present

## 2022-11-23 DIAGNOSIS — H9201 Otalgia, right ear: Secondary | ICD-10-CM | POA: Diagnosis not present

## 2022-11-23 DIAGNOSIS — H6123 Impacted cerumen, bilateral: Secondary | ICD-10-CM | POA: Diagnosis not present

## 2023-01-20 DIAGNOSIS — Z8673 Personal history of transient ischemic attack (TIA), and cerebral infarction without residual deficits: Secondary | ICD-10-CM | POA: Diagnosis not present

## 2023-01-20 DIAGNOSIS — E1149 Type 2 diabetes mellitus with other diabetic neurological complication: Secondary | ICD-10-CM | POA: Diagnosis not present

## 2023-01-20 DIAGNOSIS — I10 Essential (primary) hypertension: Secondary | ICD-10-CM | POA: Diagnosis not present

## 2023-01-20 DIAGNOSIS — Z6827 Body mass index (BMI) 27.0-27.9, adult: Secondary | ICD-10-CM | POA: Diagnosis not present

## 2023-01-20 DIAGNOSIS — M797 Fibromyalgia: Secondary | ICD-10-CM | POA: Diagnosis not present

## 2023-01-20 DIAGNOSIS — E7800 Pure hypercholesterolemia, unspecified: Secondary | ICD-10-CM | POA: Diagnosis not present

## 2023-01-20 DIAGNOSIS — T148XXA Other injury of unspecified body region, initial encounter: Secondary | ICD-10-CM | POA: Diagnosis not present

## 2023-01-25 DIAGNOSIS — I10 Essential (primary) hypertension: Secondary | ICD-10-CM | POA: Diagnosis not present

## 2023-02-02 DIAGNOSIS — I10 Essential (primary) hypertension: Secondary | ICD-10-CM | POA: Diagnosis not present

## 2023-02-13 ENCOUNTER — Other Ambulatory Visit: Payer: Self-pay | Admitting: Family Medicine

## 2023-02-13 ENCOUNTER — Ambulatory Visit
Admission: RE | Admit: 2023-02-13 | Discharge: 2023-02-13 | Disposition: A | Discharge status: Home / Self Care | Payer: Medicare PPO | Source: Ambulatory Visit | Attending: Family Medicine | Admitting: Family Medicine

## 2023-02-13 DIAGNOSIS — Z780 Asymptomatic menopausal state: Secondary | ICD-10-CM | POA: Diagnosis not present

## 2023-02-13 DIAGNOSIS — M8580 Other specified disorders of bone density and structure, unspecified site: Secondary | ICD-10-CM

## 2023-02-13 DIAGNOSIS — M810 Age-related osteoporosis without current pathological fracture: Secondary | ICD-10-CM | POA: Diagnosis not present

## 2023-02-13 DIAGNOSIS — Z1231 Encounter for screening mammogram for malignant neoplasm of breast: Secondary | ICD-10-CM

## 2023-02-13 DIAGNOSIS — M85851 Other specified disorders of bone density and structure, right thigh: Secondary | ICD-10-CM | POA: Diagnosis not present

## 2023-04-12 DIAGNOSIS — M79644 Pain in right finger(s): Secondary | ICD-10-CM | POA: Diagnosis not present

## 2023-04-12 DIAGNOSIS — M13842 Other specified arthritis, left hand: Secondary | ICD-10-CM | POA: Diagnosis not present

## 2023-04-12 DIAGNOSIS — M79645 Pain in left finger(s): Secondary | ICD-10-CM | POA: Diagnosis not present

## 2023-05-17 DIAGNOSIS — M25242 Flail joint, left hand: Secondary | ICD-10-CM | POA: Diagnosis not present

## 2023-05-17 DIAGNOSIS — Z01818 Encounter for other preprocedural examination: Secondary | ICD-10-CM | POA: Diagnosis not present

## 2023-05-17 DIAGNOSIS — E119 Type 2 diabetes mellitus without complications: Secondary | ICD-10-CM | POA: Diagnosis not present

## 2023-05-17 DIAGNOSIS — D649 Anemia, unspecified: Secondary | ICD-10-CM | POA: Diagnosis not present

## 2023-05-17 DIAGNOSIS — E1149 Type 2 diabetes mellitus with other diabetic neurological complication: Secondary | ICD-10-CM | POA: Diagnosis not present

## 2023-05-17 DIAGNOSIS — E7800 Pure hypercholesterolemia, unspecified: Secondary | ICD-10-CM | POA: Diagnosis not present

## 2023-05-17 DIAGNOSIS — Z6827 Body mass index (BMI) 27.0-27.9, adult: Secondary | ICD-10-CM | POA: Diagnosis not present

## 2023-05-26 DIAGNOSIS — M19032 Primary osteoarthritis, left wrist: Secondary | ICD-10-CM | POA: Diagnosis not present

## 2023-05-26 DIAGNOSIS — M1812 Unilateral primary osteoarthritis of first carpometacarpal joint, left hand: Secondary | ICD-10-CM | POA: Diagnosis not present

## 2023-05-26 DIAGNOSIS — M5412 Radiculopathy, cervical region: Secondary | ICD-10-CM | POA: Diagnosis not present

## 2023-06-08 DIAGNOSIS — Z4789 Encounter for other orthopedic aftercare: Secondary | ICD-10-CM | POA: Diagnosis not present

## 2023-06-26 DIAGNOSIS — Z4789 Encounter for other orthopedic aftercare: Secondary | ICD-10-CM | POA: Diagnosis not present

## 2023-06-26 DIAGNOSIS — M25642 Stiffness of left hand, not elsewhere classified: Secondary | ICD-10-CM | POA: Diagnosis not present

## 2023-06-26 DIAGNOSIS — M1812 Unilateral primary osteoarthritis of first carpometacarpal joint, left hand: Secondary | ICD-10-CM | POA: Diagnosis not present

## 2023-07-06 DIAGNOSIS — M25642 Stiffness of left hand, not elsewhere classified: Secondary | ICD-10-CM | POA: Diagnosis not present

## 2023-07-13 DIAGNOSIS — M25642 Stiffness of left hand, not elsewhere classified: Secondary | ICD-10-CM | POA: Diagnosis not present

## 2023-07-27 DIAGNOSIS — M1812 Unilateral primary osteoarthritis of first carpometacarpal joint, left hand: Secondary | ICD-10-CM | POA: Diagnosis not present

## 2023-07-27 DIAGNOSIS — Z4789 Encounter for other orthopedic aftercare: Secondary | ICD-10-CM | POA: Diagnosis not present

## 2023-08-04 DIAGNOSIS — M25642 Stiffness of left hand, not elsewhere classified: Secondary | ICD-10-CM | POA: Diagnosis not present

## 2023-08-14 DIAGNOSIS — M25642 Stiffness of left hand, not elsewhere classified: Secondary | ICD-10-CM | POA: Diagnosis not present

## 2023-08-14 DIAGNOSIS — Z86010 Personal history of colonic polyps: Secondary | ICD-10-CM | POA: Diagnosis not present

## 2023-08-14 DIAGNOSIS — K5900 Constipation, unspecified: Secondary | ICD-10-CM | POA: Diagnosis not present

## 2023-08-14 DIAGNOSIS — D649 Anemia, unspecified: Secondary | ICD-10-CM | POA: Diagnosis not present

## 2023-08-22 DIAGNOSIS — M25642 Stiffness of left hand, not elsewhere classified: Secondary | ICD-10-CM | POA: Diagnosis not present

## 2023-08-28 DIAGNOSIS — Z0000 Encounter for general adult medical examination without abnormal findings: Secondary | ICD-10-CM | POA: Diagnosis not present

## 2023-08-28 DIAGNOSIS — Z23 Encounter for immunization: Secondary | ICD-10-CM | POA: Diagnosis not present

## 2023-08-28 DIAGNOSIS — Z1389 Encounter for screening for other disorder: Secondary | ICD-10-CM | POA: Diagnosis not present

## 2023-08-28 DIAGNOSIS — E663 Overweight: Secondary | ICD-10-CM | POA: Diagnosis not present

## 2023-09-18 DIAGNOSIS — M25642 Stiffness of left hand, not elsewhere classified: Secondary | ICD-10-CM | POA: Diagnosis not present

## 2023-09-20 DIAGNOSIS — D509 Iron deficiency anemia, unspecified: Secondary | ICD-10-CM | POA: Diagnosis not present

## 2023-09-20 DIAGNOSIS — K222 Esophageal obstruction: Secondary | ICD-10-CM | POA: Diagnosis not present

## 2023-09-20 DIAGNOSIS — K648 Other hemorrhoids: Secondary | ICD-10-CM | POA: Diagnosis not present

## 2023-09-20 DIAGNOSIS — D124 Benign neoplasm of descending colon: Secondary | ICD-10-CM | POA: Diagnosis not present

## 2023-09-20 DIAGNOSIS — K2970 Gastritis, unspecified, without bleeding: Secondary | ICD-10-CM | POA: Diagnosis not present

## 2023-09-20 DIAGNOSIS — R1310 Dysphagia, unspecified: Secondary | ICD-10-CM | POA: Diagnosis not present

## 2023-09-20 DIAGNOSIS — K2289 Other specified disease of esophagus: Secondary | ICD-10-CM | POA: Diagnosis not present

## 2023-09-22 DIAGNOSIS — D124 Benign neoplasm of descending colon: Secondary | ICD-10-CM | POA: Diagnosis not present

## 2023-09-22 DIAGNOSIS — K2970 Gastritis, unspecified, without bleeding: Secondary | ICD-10-CM | POA: Diagnosis not present

## 2023-09-22 DIAGNOSIS — K2289 Other specified disease of esophagus: Secondary | ICD-10-CM | POA: Diagnosis not present

## 2023-09-25 DIAGNOSIS — M25642 Stiffness of left hand, not elsewhere classified: Secondary | ICD-10-CM | POA: Diagnosis not present

## 2023-10-02 DIAGNOSIS — Z4789 Encounter for other orthopedic aftercare: Secondary | ICD-10-CM | POA: Diagnosis not present

## 2023-10-02 DIAGNOSIS — M1812 Unilateral primary osteoarthritis of first carpometacarpal joint, left hand: Secondary | ICD-10-CM | POA: Diagnosis not present

## 2023-10-09 DIAGNOSIS — E119 Type 2 diabetes mellitus without complications: Secondary | ICD-10-CM | POA: Diagnosis not present

## 2023-10-09 DIAGNOSIS — H5213 Myopia, bilateral: Secondary | ICD-10-CM | POA: Diagnosis not present

## 2023-11-23 DIAGNOSIS — D509 Iron deficiency anemia, unspecified: Secondary | ICD-10-CM | POA: Diagnosis not present

## 2023-11-23 DIAGNOSIS — E119 Type 2 diabetes mellitus without complications: Secondary | ICD-10-CM | POA: Diagnosis not present

## 2023-11-23 DIAGNOSIS — M797 Fibromyalgia: Secondary | ICD-10-CM | POA: Diagnosis not present

## 2023-11-23 DIAGNOSIS — I10 Essential (primary) hypertension: Secondary | ICD-10-CM | POA: Diagnosis not present

## 2023-11-23 DIAGNOSIS — E1149 Type 2 diabetes mellitus with other diabetic neurological complication: Secondary | ICD-10-CM | POA: Diagnosis not present

## 2023-11-23 DIAGNOSIS — I700 Atherosclerosis of aorta: Secondary | ICD-10-CM | POA: Diagnosis not present

## 2024-05-24 DIAGNOSIS — M5412 Radiculopathy, cervical region: Secondary | ICD-10-CM | POA: Diagnosis not present

## 2024-05-24 DIAGNOSIS — Z6828 Body mass index (BMI) 28.0-28.9, adult: Secondary | ICD-10-CM | POA: Diagnosis not present

## 2024-06-06 DIAGNOSIS — D509 Iron deficiency anemia, unspecified: Secondary | ICD-10-CM | POA: Diagnosis not present

## 2024-06-06 DIAGNOSIS — L659 Nonscarring hair loss, unspecified: Secondary | ICD-10-CM | POA: Diagnosis not present

## 2024-06-06 DIAGNOSIS — I10 Essential (primary) hypertension: Secondary | ICD-10-CM | POA: Diagnosis not present

## 2024-06-06 DIAGNOSIS — E1149 Type 2 diabetes mellitus with other diabetic neurological complication: Secondary | ICD-10-CM | POA: Diagnosis not present

## 2024-06-24 ENCOUNTER — Other Ambulatory Visit: Payer: Self-pay | Admitting: Family Medicine

## 2024-06-24 DIAGNOSIS — Z1231 Encounter for screening mammogram for malignant neoplasm of breast: Secondary | ICD-10-CM

## 2024-07-09 ENCOUNTER — Ambulatory Visit

## 2024-07-10 ENCOUNTER — Ambulatory Visit

## 2024-07-19 ENCOUNTER — Ambulatory Visit

## 2024-08-09 ENCOUNTER — Inpatient Hospital Stay: Admission: RE | Admit: 2024-08-09 | Source: Ambulatory Visit

## 2024-08-09 ENCOUNTER — Ambulatory Visit
Admission: RE | Admit: 2024-08-09 | Discharge: 2024-08-09 | Disposition: A | Discharge status: Home / Self Care | Source: Ambulatory Visit | Attending: Family Medicine | Admitting: Family Medicine

## 2024-08-09 DIAGNOSIS — Z1231 Encounter for screening mammogram for malignant neoplasm of breast: Secondary | ICD-10-CM

## 2024-09-11 DIAGNOSIS — M62838 Other muscle spasm: Secondary | ICD-10-CM | POA: Diagnosis not present

## 2024-09-11 DIAGNOSIS — M898X1 Other specified disorders of bone, shoulder: Secondary | ICD-10-CM | POA: Diagnosis not present

## 2024-09-23 ENCOUNTER — Emergency Department (HOSPITAL_COMMUNITY)

## 2024-09-23 ENCOUNTER — Other Ambulatory Visit: Payer: Self-pay

## 2024-09-23 ENCOUNTER — Emergency Department (HOSPITAL_COMMUNITY)
Admission: EM | Admit: 2024-09-23 | Discharge: 2024-09-24 | Disposition: A | Discharge status: Home / Self Care | Attending: Emergency Medicine | Admitting: Emergency Medicine

## 2024-09-23 ENCOUNTER — Encounter (HOSPITAL_COMMUNITY): Payer: Self-pay

## 2024-09-23 DIAGNOSIS — R519 Headache, unspecified: Secondary | ICD-10-CM | POA: Diagnosis not present

## 2024-09-23 DIAGNOSIS — E119 Type 2 diabetes mellitus without complications: Secondary | ICD-10-CM | POA: Diagnosis not present

## 2024-09-23 DIAGNOSIS — I6523 Occlusion and stenosis of bilateral carotid arteries: Secondary | ICD-10-CM | POA: Diagnosis not present

## 2024-09-23 DIAGNOSIS — Z7902 Long term (current) use of antithrombotics/antiplatelets: Secondary | ICD-10-CM | POA: Insufficient documentation

## 2024-09-23 DIAGNOSIS — Z7984 Long term (current) use of oral hypoglycemic drugs: Secondary | ICD-10-CM | POA: Insufficient documentation

## 2024-09-23 DIAGNOSIS — I10 Essential (primary) hypertension: Secondary | ICD-10-CM | POA: Insufficient documentation

## 2024-09-23 DIAGNOSIS — R110 Nausea: Secondary | ICD-10-CM | POA: Diagnosis not present

## 2024-09-23 DIAGNOSIS — D320 Benign neoplasm of cerebral meninges: Secondary | ICD-10-CM | POA: Diagnosis not present

## 2024-09-23 DIAGNOSIS — Z79899 Other long term (current) drug therapy: Secondary | ICD-10-CM | POA: Insufficient documentation

## 2024-09-23 LAB — CBC WITH DIFFERENTIAL/PLATELET
Abs Immature Granulocytes: 0.02 K/uL (ref 0.00–0.07)
Basophils Absolute: 0 K/uL (ref 0.0–0.1)
Basophils Relative: 1 %
Eosinophils Absolute: 0.1 K/uL (ref 0.0–0.5)
Eosinophils Relative: 1 %
HCT: 38.8 % (ref 36.0–46.0)
Hemoglobin: 12.6 g/dL (ref 12.0–15.0)
Immature Granulocytes: 0 %
Lymphocytes Relative: 56 %
Lymphs Abs: 4 K/uL (ref 0.7–4.0)
MCH: 28.7 pg (ref 26.0–34.0)
MCHC: 32.5 g/dL (ref 30.0–36.0)
MCV: 88.4 fL (ref 80.0–100.0)
Monocytes Absolute: 0.4 K/uL (ref 0.1–1.0)
Monocytes Relative: 6 %
Neutro Abs: 2.5 K/uL (ref 1.7–7.7)
Neutrophils Relative %: 36 %
Platelets: 374 K/uL (ref 150–400)
RBC: 4.39 MIL/uL (ref 3.87–5.11)
RDW: 12.9 % (ref 11.5–15.5)
WBC: 7.1 K/uL (ref 4.0–10.5)
nRBC: 0 % (ref 0.0–0.2)

## 2024-09-23 LAB — COMPREHENSIVE METABOLIC PANEL WITH GFR
ALT: 40 U/L (ref 0–44)
AST: 36 U/L (ref 15–41)
Albumin: 4.1 g/dL (ref 3.5–5.0)
Alkaline Phosphatase: 72 U/L (ref 38–126)
Anion gap: 11 (ref 5–15)
BUN: 10 mg/dL (ref 8–23)
CO2: 26 mmol/L (ref 22–32)
Calcium: 9.7 mg/dL (ref 8.9–10.3)
Chloride: 104 mmol/L (ref 98–111)
Creatinine, Ser: 0.58 mg/dL (ref 0.44–1.00)
GFR, Estimated: >60 mL/min (ref >60)
Glucose, Bld: 123 mg/dL — ABNORMAL HIGH (ref 70–99)
Potassium: 4.2 mmol/L (ref 3.5–5.1)
Sodium: 141 mmol/L (ref 135–145)
Total Bilirubin: 0.4 mg/dL (ref 0.0–1.2)
Total Protein: 7.1 g/dL (ref 6.5–8.1)

## 2024-09-23 LAB — TROPONIN I (HIGH SENSITIVITY): Troponin I (High Sensitivity): 3 ng/L (ref <18)

## 2024-09-23 LAB — SEDIMENTATION RATE: Sed Rate: 10 mm/h (ref 0–22)

## 2024-09-23 NOTE — ED Provider Notes (Signed)
 MSE  Patient examined in triage with primary concerns for frontal and left-sided headache, mild jaw pain nausea and gradually worsening blood pressure throughout today.  Patient has not missed any of her medications unsure of the names of blood pressure meds.  No loss of vision, double vision, unilateral weakness or numbness.  Patient has history of TIA.  On examination patient generally uncomfortable, normal heart rate, normal work of breathing, pupils equal bilateral, cranial nerves intact, equal strength in all extremities without any arm drift, finger-nose equal bilateral.  Mild dry mucous membranes.  Plan for CT scan of the head without contrast, CBC, CMP, troponin with jaw pain, sed rate with headache/jaw and temple pain.  Care team will continue evaluation and reassessment.  Fonda CHRISTELLA Tonia Tonia Fonda, MD 10/01/24 [phone]

## 2024-09-23 NOTE — ED Triage Notes (Signed)
 Patient bib [facility] from home with complaints of a headache since yesterday morning. EMS reports she has radiating jaw pain and nausea. Patient denies chest pain and shob. EMS reports she is hypertensive.  PMH- TIA, HTN, on plavix 

## 2024-09-24 LAB — TROPONIN I (HIGH SENSITIVITY): Troponin I (High Sensitivity): 4 ng/L (ref <18)

## 2024-09-24 MED ORDER — DIPHENHYDRAMINE HCL 50 MG/ML IJ SOLN
12.5000 mg | Freq: Once | INTRAMUSCULAR | Status: AC
Start: 2024-09-24 — End: 2024-09-24
  Administered 2024-09-24: 12.5 mg via INTRAVENOUS
  Filled 2024-09-24: qty 1

## 2024-09-24 MED ORDER — PROCHLORPERAZINE EDISYLATE 10 MG/2ML IJ SOLN
10.0000 mg | Freq: Once | INTRAMUSCULAR | Status: AC
  Administered 2024-09-24: 10 mg via INTRAVENOUS
  Filled 2024-09-24: qty 2

## 2024-09-24 MED ORDER — AMLODIPINE BESYLATE 5 MG PO TABS
5.0000 mg | ORAL_TABLET | Freq: Once | ORAL | Status: AC
  Administered 2024-09-24: 5 mg via ORAL
  Filled 2024-09-24: qty 1

## 2024-09-24 NOTE — Discharge Instructions (Signed)
 You were seen today with concern for high blood pressure and headache.  Your workup is generally reassuring.  It is unclear whether the headache was driving your blood pressure up or vice versa.  Make sure to keep an eye on your blood pressure.  Continue your daily blood pressure medications as instructed.  Follow-up with your primary doctor.

## 2024-09-24 NOTE — ED Provider Notes (Signed)
 [facility] AT [facility] Provider Note   CSN: [id number] Arrival date & time: 09/23/24  1928     Patient presents with: Headache and Hypertension   Rhonda Norris is a 73 y.o. female.   HPI     This is a 73 year old female who presents with high blood pressure and headache.  Patient reports onset of symptoms yesterday.  She states that normally her blood pressure is well-regulated on amlodipine  once daily.  She noted that she had bitemporal headache yesterday.  She does not normally get headaches.  No strokelike symptoms including speech difficulty, weakness, numbness, vision changes.  She woke up this morning and continued to have headache.  She noted that her blood pressure was uptrending up to 180 systolic.  This concerned her.  No nausea or vomiting.  Prior to Admission medications   Medication Sig Start Date End Date Taking? Authorizing Provider  acidophilus (RISAQUAD) CAPS capsule Take 1 capsule by mouth daily.    [provider]  amLODipine  (NORVASC ) 5 MG tablet Take 1 tablet by mouth once daily 04/29/22   Ross, Paula V, MD  amoxicillin (AMOXIL) 500 MG tablet amoxicillin 500 mg tablet  Take 4 tablets 1 hour prior to dental appt.    [provider]  Ascorbic Acid (VITAMIN C ) 500 MG CAPS Take 500 mg by mouth daily as needed (cold symptoms).     [provider]  BIOTIN PO Take 1 tablet by mouth daily.    [provider]  Calcium  Carbonate-Vitamin D  600-200 MG-UNIT TABS Take 1 tablet by mouth daily.     [provider]  Cholecalciferol  (VITAMIN D ) 50 MCG (2000 UT) CAPS 1 capsule    [provider]  CINNAMON PO Take 1 capsule by mouth daily.    [provider]  clopidogrel  (PLAVIX ) 75 MG tablet Take 1 tablet (75 mg total) by mouth daily. 06/20/16   Sebastian Toribio GAILS, MD  Coenzyme Q10 (COQ10) 400 MG CAPS Take 1 capsule by mouth daily.    [provider]  Cranberry 250 MG  CAPS Take 250 mg by mouth daily as needed (for UTI prevention).     [provider]  diphenhydrAMINE  (BENADRYL ) 25 MG tablet Take 25 mg by mouth every 6 (six) hours as needed for allergies.    [provider]  ezetimibe  (ZETIA ) 10 MG tablet Take 1 tablet by mouth once daily 04/29/22   Okey Vina GAILS, MD  famotidine  (PEPCID ) 20 MG tablet 1 tablet at bedtime as needed    [provider]  fluticasone  (FLONASE ) 50 MCG/ACT nasal spray 1 spray in each nostril 09/03/13   [provider]  Ginger, Zingiber officinalis, (GINGER ROOT PO) Take 1 capsule by mouth daily. Patient not taking: Reported on 06/14/2022    [provider]  Ginkgo Biloba 120 MG CAPS Take 120 mg by mouth daily.    [provider]  Glucosamine HCl 1500 MG TABS Take 1,500 mg by mouth daily.     [provider]  ibuprofen  (ADVIL ) 200 MG tablet 1 tablet as needed    [provider]  Lactobacillus (ACIDOPHILUS) CAPS capsule 1 capsule    [provider]  loratadine  (CLARITIN ) 10 MG tablet Take 10 mg by mouth daily.    [provider]  losartan (COZAAR) 50 MG tablet Take 50 mg by mouth daily.    [provider]  Melatonin 3 MG TABS Take 3 mg by mouth at bedtime as needed (  for sleep).     [provider]  metFORMIN  (GLUCOPHAGE ) 500 MG tablet Take 500 mg by mouth 2 (two) times daily with a meal.     [provider]  Misc Natural Products (TURMERIC CURCUMIN) CAPS 1 capsule    [provider]  mometasone (NASONEX) 50 MCG/ACT nasal spray Place 50 mcg into the nose daily as needed (congestion).     [provider]  Multiple Vitamin (MULTIVITAMIN WITH MINERALS) TABS tablet Take 1 tablet by mouth daily.    [provider]  Multiple Vitamins-Minerals (HAIR SKIN & NAILS ADVANCED) TABS Take 1 tablet by mouth daily.    [provider]  nortriptyline  (PAMELOR ) 25 MG capsule Take 25 mg by mouth at bedtime.   10/14/14   [provider]  Omega-3 Fatty Acids (FISH OIL) 1000 MG CAPS Take 1,000 mg by mouth daily.    [provider]  OZEMPIC, 0.25 OR 0.5 MG/DOSE, 2 MG/1.5ML SOPN Inject into the skin. 03/18/22   [provider]  polyethylene glycol (MIRALAX  / GLYCOLAX ) packet Take 17 g by mouth daily as needed for moderate constipation.     [provider]  pravastatin  (PRAVACHOL ) 80 MG tablet TAKE 1 TABLET BY MOUTH ONCE DAILY IN THE EVENING 04/29/22   Okey Vina GAILS, MD  senna-docusate (SENOKOT-S) 8.6-50 MG tablet 1 tablet in the evening as needed 06/22/16   [provider]  TURMERIC PO Take 1 tablet by mouth daily.    [provider]  VITAMIN D  PO Take 1 capsule by mouth daily. Patient not taking: Reported on 06/14/2022    [provider]  vitamin E 100 UNIT capsule Take 100 Units by mouth daily.     [provider]    Allergies: Dexilant [dexlansoprazole], Sulfa antibiotics, Aspirin , Caffeine, Rosuvastatin , Sulfanilamide, and Methocarbamol    Review of Systems  Constitutional:  Negative for fever.  Respiratory:  Negative for shortness of breath.   Cardiovascular:  Negative for chest pain.  Gastrointestinal:  Negative for nausea and vomiting.  Neurological:  Positive for headaches. Negative for weakness and numbness.  All other systems reviewed and are negative.   Updated Vital Signs BP 122/69   Pulse 73   Temp 98.5 F (36.9 C)   Resp 15   Ht 1.626 m (5' 4)   Wt 72.6 kg   SpO2 100%   BMI 27.46 kg/m   Physical Exam Vitals and nursing note reviewed.  Constitutional:      Appearance: She is well-developed. She is not ill-appearing.  HENT:     Head: Normocephalic and atraumatic.  Eyes:     Pupils: Pupils are equal, round, and reactive to light.  Cardiovascular:     Rate and Rhythm: Normal rate and regular rhythm.     Heart sounds: Normal heart sounds.  Pulmonary:     Effort: Pulmonary effort is normal. No respiratory  distress.     Breath sounds: No wheezing.  Abdominal:     General: Bowel sounds are normal.     Palpations: Abdomen is soft.  Musculoskeletal:     Cervical back: Neck supple.  Skin:    General: Skin is warm and dry.  Neurological:     Mental Status: She is alert and oriented to person, place, and time.     Comments: Fluent speech, cranial nerves II through XII intact, 5 out of 5 strength in all 4 extremities, no dysmetria to finger-nose-finger     (all labs ordered are listed, but only  abnormal results are displayed) Labs Reviewed  COMPREHENSIVE METABOLIC PANEL WITH GFR - Abnormal; Notable for the following components:      Result Value   Glucose, Bld 123 (*)    All other components within normal limits  CBC WITH DIFFERENTIAL/PLATELET  SEDIMENTATION RATE  TROPONIN I (HIGH SENSITIVITY)  TROPONIN I (HIGH SENSITIVITY)    EKG: None  Radiology: CT Head Wo Contrast Result Date: 09/23/2024 CLINICAL DATA:  headache vomiting EXAM: CT HEAD WITHOUT CONTRAST TECHNIQUE: Contiguous axial images were obtained from the base of the skull through the vertex without intravenous contrast. RADIATION DOSE REDUCTION: This exam was performed according to the departmental dose-optimization program which includes automated exposure control, adjustment of the mA and/or kV according to patient size and/or use of iterative reconstruction technique. COMPARISON:  MRI head 06/17/2016, CT head 06/17/2016 FINDINGS: Brain: Trace patchy and confluent areas of decreased attenuation are noted throughout the deep and periventricular white matter of the cerebral hemispheres bilaterally, compatible with chronic microvascular ischemic disease. No evidence of large-territorial acute infarction. No parenchymal hemorrhage. Calcified left frontal 1.2 cm meningioma stable in size. Otherwise no mass lesion. No extra-axial collection. No mass effect or midline shift. No hydrocephalus. Basilar cisterns are patent. Vascular: No  hyperdense vessel. Atherosclerotic calcifications are present within the cavernous internal carotid arteries. Skull: No acute fracture or focal lesion. Sinuses/Orbits: Paranasal sinuses and mastoid air cells are clear. The orbits are unremarkable. Other: None. IMPRESSION: No acute intracranial abnormality. Electronically Signed   By: Morgane  Naveau M.D.   On: 09/23/2024 22:27     Procedures   Medications Ordered in the ED  amLODipine  (NORVASC ) tablet 5 mg (5 mg Oral Given 09/24/24 0256)  prochlorperazine (COMPAZINE) injection 10 mg (10 mg Intravenous Given 09/24/24 0256)  diphenhydrAMINE  (BENADRYL ) injection 12.5 mg (12.5 mg Intravenous Given 09/24/24 0256)    Clinical Course as of 09/24/24 0423  Tue Sep 24, 2024  0423 Patient reports improvement after migraine cocktail and home blood pressure medications.  No signs or symptoms of hypertensive urgency or emergency.  Blood pressure has downtrended to 122/69.  Recommend close follow-up with primary doctor [CH]    Clinical Course User Index [CH] Saretta Dahlem, Charmaine FALCON, MD                                 Medical Decision Making Risk Prescription drug management.   This patient presents to the ED for concern of headache, high blood pressure, this involves an extensive number of treatment options, and is a complaint that carries with it a high risk of complications and morbidity.  I considered the following differential and admission for this acute, potentially life threatening condition.  The differential diagnosis includes patient headache, migraine, hypertensive urgency, hypertensive emergency, [facility]  MDM:    This is a 73 year old female who presents with headache.  She states that her blood pressure has been uptrending.  Blood pressure initially 176/82.  Reports compliance with her blood pressure medication.  No signs or symptoms of hypertensive urgency or emergency.  No significant red flags.  Basic lab work obtained and reassuring.  CT head  without acute bleed.  Patient was given her home dose of amlodipine  and migraine cocktail.  She was observed and had significant clinical improvement.  Blood pressure down trended to 122/69.  (Labs, imaging, consults)  Labs: I Ordered, and personally interpreted labs.  The pertinent results include: CBC, BMP, troponin  Imaging Studies ordered:  I ordered imaging studies including CT head I independently visualized and interpreted imaging. I agree with the radiologist interpretation  Additional history obtained from chart review.  External records from outside source obtained and reviewed including prior evaluations  Cardiac Monitoring: The patient was maintained on a cardiac monitor.  If on the cardiac monitor, I personally viewed and interpreted the cardiac monitored which showed an underlying rhythm of: Sinus  Reevaluation: After the interventions noted above, I reevaluated the patient and found that they have :resolved  Social Determinants of Health:  lives independently  Disposition: Discharge  Co morbidities that complicate the patient evaluation  Past Medical History:  Diagnosis Date   Allergic rhinitis    Arthritis    Carpal tunnel syndrome    Diabetes mellitus without complication (HCC) 02/08/2017   ON METFORMIN  BID    Elevated cholesterol    Fibromyalgia    GERD (gastroesophageal reflux disease)    HTN (hypertension)    IBD (inflammatory bowel disease)    Osteopenia 03/2018   T score -2.1 FRAX 3.7% / 0.5%   Pancreatitis, gallstone    PONV (postoperative nausea and vomiting)    NAUSEA AFTER KNEE ARTHROSCOPY    Stroke (HCC)      Medicines Meds ordered this encounter  Medications   amLODipine  (NORVASC ) tablet 5 mg   prochlorperazine (COMPAZINE) injection 10 mg   diphenhydrAMINE  (BENADRYL ) injection 12.5 mg    I have reviewed the patients home medicines and have made adjustments as needed  Problem List / ED Course: Problem List Items Addressed This Visit    None Visit Diagnoses       Acute intractable headache, unspecified headache type    -  Primary   Relevant Medications   amLODipine  (NORVASC ) tablet 5 mg (Completed)     Primary hypertension       Relevant Medications   amLODipine  (NORVASC ) tablet 5 mg (Completed)                Final diagnoses:  Acute intractable headache, unspecified headache type  Primary hypertension    ED Discharge Orders     None          Bari Charmaine FALCON, MD 09/24/24 [phone]

## 2024-09-24 NOTE — ED Notes (Signed)
 EDP at bedside

## 2024-09-26 DIAGNOSIS — I10 Essential (primary) hypertension: Secondary | ICD-10-CM | POA: Diagnosis not present

## 2024-09-26 DIAGNOSIS — E1165 Type 2 diabetes mellitus with hyperglycemia: Secondary | ICD-10-CM | POA: Diagnosis not present

## 2024-09-26 DIAGNOSIS — E1149 Type 2 diabetes mellitus with other diabetic neurological complication: Secondary | ICD-10-CM | POA: Diagnosis not present

## 2024-09-26 DIAGNOSIS — Z6828 Body mass index (BMI) 28.0-28.9, adult: Secondary | ICD-10-CM | POA: Diagnosis not present

## 2024-09-26 DIAGNOSIS — R519 Headache, unspecified: Secondary | ICD-10-CM | POA: Diagnosis not present

## 2024-10-09 DIAGNOSIS — E119 Type 2 diabetes mellitus without complications: Secondary | ICD-10-CM | POA: Diagnosis not present

## 2024-10-09 DIAGNOSIS — H5213 Myopia, bilateral: Secondary | ICD-10-CM | POA: Diagnosis not present

## 2024-11-06 DIAGNOSIS — I10 Essential (primary) hypertension: Secondary | ICD-10-CM | POA: Diagnosis not present

## 2024-11-06 DIAGNOSIS — M810 Age-related osteoporosis without current pathological fracture: Secondary | ICD-10-CM | POA: Diagnosis not present

## 2024-11-06 DIAGNOSIS — Z8673 Personal history of transient ischemic attack (TIA), and cerebral infarction without residual deficits: Secondary | ICD-10-CM | POA: Diagnosis not present

## 2024-11-06 DIAGNOSIS — Z0000 Encounter for general adult medical examination without abnormal findings: Secondary | ICD-10-CM | POA: Diagnosis not present

## 2024-11-06 DIAGNOSIS — D72820 Lymphocytosis (symptomatic): Secondary | ICD-10-CM | POA: Diagnosis not present

## 2024-11-06 DIAGNOSIS — E1149 Type 2 diabetes mellitus with other diabetic neurological complication: Secondary | ICD-10-CM | POA: Diagnosis not present

## 2024-11-06 DIAGNOSIS — Z23 Encounter for immunization: Secondary | ICD-10-CM | POA: Diagnosis not present
